# Patient Record
Sex: Female | Born: 1960 | Race: White | Hispanic: No | Marital: Married | Smoking: Former smoker
Health system: Southern US, Community
[De-identification: ages and names within clinical notes are randomized; demographics above are authoritative.]

## PROBLEM LIST (undated history)

## (undated) DIAGNOSIS — R768 Other specified abnormal immunological findings in serum: Secondary | ICD-10-CM

## (undated) DIAGNOSIS — R7689 Other specified abnormal immunological findings in serum: Secondary | ICD-10-CM

## (undated) DIAGNOSIS — M1990 Unspecified osteoarthritis, unspecified site: Secondary | ICD-10-CM

## (undated) DIAGNOSIS — R112 Nausea with vomiting, unspecified: Secondary | ICD-10-CM

## (undated) DIAGNOSIS — Z85828 Personal history of other malignant neoplasm of skin: Secondary | ICD-10-CM

## (undated) DIAGNOSIS — F419 Anxiety disorder, unspecified: Secondary | ICD-10-CM

## (undated) DIAGNOSIS — R002 Palpitations: Secondary | ICD-10-CM

## (undated) DIAGNOSIS — I10 Essential (primary) hypertension: Secondary | ICD-10-CM

## (undated) DIAGNOSIS — G510 Bell's palsy: Secondary | ICD-10-CM

## (undated) DIAGNOSIS — T8859XA Other complications of anesthesia, initial encounter: Secondary | ICD-10-CM

## (undated) DIAGNOSIS — F329 Major depressive disorder, single episode, unspecified: Secondary | ICD-10-CM

## (undated) DIAGNOSIS — C801 Malignant (primary) neoplasm, unspecified: Secondary | ICD-10-CM

## (undated) DIAGNOSIS — M797 Fibromyalgia: Secondary | ICD-10-CM

## (undated) DIAGNOSIS — Z9889 Other specified postprocedural states: Secondary | ICD-10-CM

## (undated) DIAGNOSIS — F32A Depression, unspecified: Secondary | ICD-10-CM

## (undated) DIAGNOSIS — G43909 Migraine, unspecified, not intractable, without status migrainosus: Secondary | ICD-10-CM

## (undated) DIAGNOSIS — K219 Gastro-esophageal reflux disease without esophagitis: Secondary | ICD-10-CM

## (undated) HISTORY — DX: Migraine, unspecified, not intractable, without status migrainosus: G43.909

## (undated) HISTORY — DX: Personal history of other malignant neoplasm of skin: Z85.828

## (undated) HISTORY — DX: Other specified abnormal immunological findings in serum: R76.89

## (undated) HISTORY — PX: TENDON REPAIR: SHX5111

## (undated) HISTORY — PX: KNEE ARTHROSCOPY W/ DEBRIDEMENT: SHX1867

## (undated) HISTORY — PX: WISDOM TOOTH EXTRACTION: SHX21

## (undated) HISTORY — PX: ABDOMINAL HYSTERECTOMY: SHX81

## (undated) HISTORY — DX: Essential (primary) hypertension: I10

## (undated) HISTORY — DX: Other specified abnormal immunological findings in serum: R76.8

## (undated) HISTORY — PX: ULNAR NERVE REPAIR: SHX2594

## (undated) HISTORY — DX: Gastro-esophageal reflux disease without esophagitis: K21.9

## (undated) HISTORY — PX: UPPER GI ENDOSCOPY: SHX6162

## (undated) HISTORY — DX: Depression, unspecified: F32.A

---
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
## 1998-03-25 ENCOUNTER — Ambulatory Visit: Admission: RE | Admit: 1998-03-25 | Discharge: 1998-03-25 | Payer: Self-pay | Admitting: *Deleted

## 1998-03-25 ENCOUNTER — Encounter: Payer: Self-pay | Admitting: *Deleted

## 1999-05-19 ENCOUNTER — Encounter (INDEPENDENT_AMBULATORY_CARE_PROVIDER_SITE_OTHER): Payer: Self-pay | Admitting: Specialist

## 1999-05-19 ENCOUNTER — Other Ambulatory Visit: Admission: RE | Admit: 1999-05-19 | Discharge: 1999-05-19 | Payer: Self-pay | Admitting: Otolaryngology

## 2000-02-15 HISTORY — PX: APPENDECTOMY: SHX54

## 2000-02-28 ENCOUNTER — Ambulatory Visit (HOSPITAL_COMMUNITY): Admission: RE | Admit: 2000-02-28 | Discharge: 2000-02-28 | Payer: Self-pay | Admitting: Gastroenterology

## 2000-08-18 ENCOUNTER — Encounter: Payer: Self-pay | Admitting: Internal Medicine

## 2000-08-18 ENCOUNTER — Ambulatory Visit (HOSPITAL_COMMUNITY): Admission: RE | Admit: 2000-08-18 | Discharge: 2000-08-18 | Payer: Self-pay | Admitting: Internal Medicine

## 2000-08-28 ENCOUNTER — Encounter: Payer: Self-pay | Admitting: Internal Medicine

## 2000-08-28 ENCOUNTER — Ambulatory Visit (HOSPITAL_COMMUNITY): Admission: RE | Admit: 2000-08-28 | Discharge: 2000-08-28 | Payer: Self-pay | Admitting: Internal Medicine

## 2001-04-26 ENCOUNTER — Emergency Department (HOSPITAL_COMMUNITY): Admission: EM | Admit: 2001-04-26 | Discharge: 2001-04-26 | Payer: Self-pay | Admitting: Internal Medicine

## 2001-04-26 ENCOUNTER — Encounter: Payer: Self-pay | Admitting: Internal Medicine

## 2002-02-14 HISTORY — PX: CHOLECYSTECTOMY: SHX55

## 2002-03-12 ENCOUNTER — Ambulatory Visit (HOSPITAL_COMMUNITY): Admission: RE | Admit: 2002-03-12 | Discharge: 2002-03-12 | Payer: Self-pay | Admitting: Family Medicine

## 2002-03-12 ENCOUNTER — Encounter: Payer: Self-pay | Admitting: Family Medicine

## 2002-04-09 ENCOUNTER — Encounter: Admission: RE | Admit: 2002-04-09 | Discharge: 2002-04-09 | Payer: Self-pay | Admitting: Thoracic Surgery

## 2002-04-09 ENCOUNTER — Encounter: Payer: Self-pay | Admitting: Thoracic Surgery

## 2003-08-13 ENCOUNTER — Observation Stay (HOSPITAL_COMMUNITY): Admission: EM | Admit: 2003-08-13 | Discharge: 2003-08-14 | Payer: Self-pay | Admitting: Emergency Medicine

## 2003-08-13 ENCOUNTER — Encounter (INDEPENDENT_AMBULATORY_CARE_PROVIDER_SITE_OTHER): Payer: Self-pay | Admitting: Specialist

## 2004-01-27 ENCOUNTER — Ambulatory Visit (HOSPITAL_COMMUNITY): Admission: RE | Admit: 2004-01-27 | Discharge: 2004-01-27 | Payer: Self-pay | Admitting: Internal Medicine

## 2004-07-02 ENCOUNTER — Encounter (HOSPITAL_COMMUNITY): Admission: RE | Admit: 2004-07-02 | Discharge: 2004-08-01 | Payer: Self-pay | Admitting: Internal Medicine

## 2004-07-02 ENCOUNTER — Ambulatory Visit (HOSPITAL_COMMUNITY): Payer: Self-pay | Admitting: Internal Medicine

## 2004-08-23 ENCOUNTER — Ambulatory Visit (HOSPITAL_COMMUNITY): Admission: RE | Admit: 2004-08-23 | Discharge: 2004-08-23 | Payer: Self-pay | Admitting: Internal Medicine

## 2007-02-27 ENCOUNTER — Ambulatory Visit (HOSPITAL_COMMUNITY): Admission: RE | Admit: 2007-02-27 | Discharge: 2007-02-27 | Payer: Self-pay | Admitting: Family Medicine

## 2007-09-26 ENCOUNTER — Ambulatory Visit (HOSPITAL_COMMUNITY): Admission: RE | Admit: 2007-09-26 | Discharge: 2007-09-26 | Payer: Self-pay | Admitting: Family Medicine

## 2009-02-10 ENCOUNTER — Ambulatory Visit (HOSPITAL_COMMUNITY): Admission: RE | Admit: 2009-02-10 | Discharge: 2009-02-10 | Payer: Self-pay | Admitting: Family Medicine

## 2009-02-17 ENCOUNTER — Encounter (HOSPITAL_COMMUNITY): Admission: RE | Admit: 2009-02-17 | Discharge: 2009-03-19 | Payer: Self-pay | Admitting: Family Medicine

## 2009-10-27 ENCOUNTER — Ambulatory Visit (HOSPITAL_COMMUNITY): Admission: RE | Admit: 2009-10-27 | Discharge: 2009-10-27 | Payer: Self-pay | Admitting: Internal Medicine

## 2009-11-16 ENCOUNTER — Encounter: Admission: RE | Admit: 2009-11-16 | Discharge: 2009-11-16 | Payer: Self-pay | Admitting: Neurosurgery

## 2009-12-28 ENCOUNTER — Encounter: Admission: RE | Admit: 2009-12-28 | Discharge: 2009-12-28 | Payer: Self-pay | Admitting: Neurosurgery

## 2010-01-19 ENCOUNTER — Encounter: Payer: Self-pay | Admitting: Urgent Care

## 2010-01-19 ENCOUNTER — Ambulatory Visit: Payer: Self-pay | Admitting: Internal Medicine

## 2010-01-19 ENCOUNTER — Ambulatory Visit (HOSPITAL_COMMUNITY)
Admission: RE | Admit: 2010-01-19 | Discharge: 2010-01-19 | Payer: Self-pay | Source: Home / Self Care | Admitting: Internal Medicine

## 2010-01-19 DIAGNOSIS — K5909 Other constipation: Secondary | ICD-10-CM | POA: Insufficient documentation

## 2010-01-19 DIAGNOSIS — R109 Unspecified abdominal pain: Secondary | ICD-10-CM | POA: Insufficient documentation

## 2010-01-19 DIAGNOSIS — K219 Gastro-esophageal reflux disease without esophagitis: Secondary | ICD-10-CM | POA: Insufficient documentation

## 2010-01-20 ENCOUNTER — Encounter: Payer: Self-pay | Admitting: Urgent Care

## 2010-01-21 ENCOUNTER — Encounter: Payer: Self-pay | Admitting: Urgent Care

## 2010-01-21 ENCOUNTER — Telehealth (INDEPENDENT_AMBULATORY_CARE_PROVIDER_SITE_OTHER): Payer: Self-pay

## 2010-01-21 LAB — CONVERTED CEMR LAB: TSH: 0.947 microintl units/mL (ref 0.350–4.500)

## 2010-03-07 ENCOUNTER — Encounter (INDEPENDENT_AMBULATORY_CARE_PROVIDER_SITE_OTHER): Payer: Self-pay | Admitting: Internal Medicine

## 2010-03-08 ENCOUNTER — Encounter: Payer: Self-pay | Admitting: Family Medicine

## 2010-03-09 ENCOUNTER — Encounter: Payer: Self-pay | Admitting: Urgent Care

## 2010-03-12 ENCOUNTER — Encounter: Payer: Self-pay | Admitting: Gastroenterology

## 2010-03-18 NOTE — Letter (Signed)
Summary: OP NOTE EGD FROM [facility]  OP NOTE EGD FROM [facility]   Imported By: Rexene Alberts 01/20/2010 09:09:52  _____________________________________________________________________  External Attachment:    Type:   Image     Comment:   External Document

## 2010-03-18 NOTE — Assessment & Plan Note (Signed)
Summary: ABD PAIN/SS   Visit Type:  Initial Consult Referring Provider:  McGough Primary Care Provider:  McGough  Chief Complaint:  abd pain.  History of Present Illness: 50 y/o caucasian female w/ 3 week hx of abdominal cramps & tenesmus.  Pain BLQ 8/10, radiates through entire abdomen, usually lasts 3-5 mins.  Then began to have mucous discharge 3-4 times that day, then the following day noticed dark formed stool and continued pain.  Having BMs every couple days at that point.  Took OTC laxative w/ good results that day.  Had fever.  Continued to feel urgency but no BM.  c/o nausea without vomiting.  Denies hx GI problems.  Wt gain 6 months 10#.  c/o fatigue. Wants to sleep all the time.  Denies dysphagia, odynophagia.  Increased omeprazole to 40mg  daily x 4 weeks due to contast epigastric pain.  Hx GERD x 6 yrs.  01/11/10 labs: WBC 11.2, ANC 8000 Hgb 13.5 plts 272   Current Problems (verified): 1)  Gerd  (ICD-530.81) 2)  Constipation, Chronic  (ICD-564.09) 3)  Abdominal Pain  (ICD-789.00)  Current Medications (verified): 1)  Citalopram Hydrobromide 20 Mg Tabs (Citalopram Hydrobromide) .... Take 1 Tablet By Mouth Once A Day 2)  Premarin 0.3 Mg Tabs (Estrogens Conjugated) .... Take 1 Tablet By Mouth Once A Day 3)  Topamax 100 Mg Tabs (Topiramate) .... Take 1 Tablet By Mouth Once A Day 4)  Omeprazole 40 Mg Cpdr (Omeprazole) .... Take 1 Tablet By Mouth Once A Day 5)  Bentyl 10 Mg Caps (Dicyclomine Hcl) .... Take 1 Tablet By Mouth Four Times A Day 6)  Multi Vitamin .... Take 1 Tablet By Mouth Once A Day 7)  Zinc 50mg  .... Take 1 Tablet By Mouth Once A Day 8)  Vitamin D3 5000 Iu .... Take 1 Tablet By Mouth Once A Day 9)  Vitamin B12  1000 Mcg .... Take 1 Tablet By Mouth Once A Day  Allergies (verified): 1)  ! * Latex 2)  ! Betadine 3)  ! Codeine 4)  ! Morphine 5)  ! Sulfa 6)  ! Celebrex 7)  ! Augmentin 8)  ! * Tequin 9)  ! * Tape 10)  ! 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  Alcohol Use - no Illicit Drug Use - no Patient does not get regular exercise.  Smoking Status:  never Drug Use:  no Does Patient Exercise:  no  Review of Systems General:  Denies sweats, anorexia, fatigue, weakness, malaise, weight loss, and sleep disorder. CV:  Denies chest pains, angina, palpitations, syncope, dyspnea on exertion, orthopnea, PND, peripheral edema, and claudication. Resp:  Denies dyspnea at rest, dyspnea with exercise, cough, sputum, wheezing, coughing up blood, and pleurisy. GI:  Denies vomiting blood, jaundice, and fecal incontinence. GU:  Denies urinary burning, blood in urine, nocturnal urination, urinary frequency, and urinary incontinence. MS:  Denies joint pain / LOM, joint swelling, joint stiffness, joint deformity, low back pain, muscle weakness, muscle cramps, muscle atrophy, leg pain at  night, leg pain with exertion, and shoulder pain / LOM hand / wrist pain (CTS). Derm:  Denies rash, itching, dry skin, hives, moles, warts, and unhealing ulcers. Psych:  Complains of anxiety; denies depression, memory loss, suicidal ideation, hallucinations, paranoia, phobia, and confusion. Heme:  Denies bruising, bleeding, and enlarged lymph nodes.  Vital Signs:  Patient profile:   50 year old female Height:      68 inches Weight:      154 pounds BMI:     23.50 Temp:     98.6 degrees F oral Pulse rate:   56 / minute BP sitting:    118 / 72  (left arm) Cuff size:   regular  Vitals Entered By: Cloria Spring LPN (January 19, 2010 11:37 AM)  Physical Exam  General:  Well developed, well nourished, no acute distress. Head:  Normocephalic and atraumatic. Eyes:  Sclera clear, no icterus. Ears:  Normal auditory acuity. Nose:  No deformity, discharge,  or lesions. Mouth:  No deformity or lesions, dentition normal. Neck:  Supple; no masses or thyromegaly. Lungs:  Clear throughout to auscultation. Heart:  Regular rate and rhythm; no murmurs, rubs,  or bruits. Abdomen:  normal bowel sounds, RLQ tenderness, LLQ tenderness, epigastric tenderness, without guarding, without rebound, no hernia, no distesion, no masses, and no hepatomegally or splenomegaly.   Msk:  Symmetrical with no gross deformities. Normal posture. Pulses:  Normal pulses noted. Extremities:  No clubbing, cyanosis, edema or deformities noted. Neurologic:  Alert and  oriented x4;  grossly normal neurologically. Skin:  Intact without significant lesions or rashes. Cervical Nodes:  No significant cervical adenopathy. Psych:  Alert and cooperative. Normal mood and affect.  Impression & Recommendations:  Problem # 1:  ABDOMINAL PAIN (ICD-789.00) 50 y/o caucasian female w/ 3 week hx of severe abdominal pain, tenesmus, fever, mild leukocytosis.  I suspect diverticulitis, colitis, or chronic constipation as culprit.  She has also had fatigue, unintentional weight gain & nausea, as well as chronic GERD.  ? Refractory GERD.  Previously evaluated by Dr Karilyn Cota in [place], [place] and had some tests done at [facility] (do not have records yet).    Orders: T-CBC w/Diff [phone]) T-TSH [phone]) T-Hepatic Function [phone]) T-Amylase [phone]) T-Lipase [id number]) T-Creatinine Blood ([id number])  Problem # 2:  GERD (ICD-530.81) see #1  Problem # 3:  CONSTIPATION, CHRONIC (ICD-564.09) See #1  Patient Instructions: 1)  To ER if severe pain 2)  Offered  pain meds, pt declined 3)  Request EGD report from Dr Karilyn Cota 4)  Records from [facility] evaluation 5)  Labs today 6)  CT Abd/pelvis w/ IV/oral contrast today 7)  Continue omeprazole 40mg  daily, may need change PPI pending CT findings, review old records cc: Dr Ralph Dowdy [facility])  Appended Document: Orders Update    Clinical Lists Changes  Orders: Added new Service order of Consultation Level III [id number]) - Signed

## 2010-03-18 NOTE — Letter (Signed)
Summary: AUTH FOR THE RELEASE OF MEDICAL INFORMATION  AUTH FOR THE RELEASE OF MEDICAL INFORMATION   Imported By: Rexene Alberts 03/09/2010 08:44:41  _____________________________________________________________________  External Attachment:    Type:   Image     Comment:   External Document

## 2010-03-18 NOTE — Medication Information (Signed)
Summary: protonix rx  protonix rx   Imported By: Hendricks Limes LPN 30/86/5784 69:62:95  _____________________________________________________________________  External Attachment:    Type:   Image     Comment:   External Document

## 2010-03-18 NOTE — Progress Notes (Signed)
Summary: change ppi rx  Phone Note Call from Patient Call back at Work Phone [phone]   Caller: Patient Summary of Call: pt called- Dexilant is not covered by her insurance and it was 150.00. pts insurance only covers omeprazole and pantoprazole on teir 1. pt is requesting pantoprazole because the omeprazole was not working well. Pt also uses mail order [facility]) it will need to be written rx so we can fax it. Initial call taken by: Hendricks Limes LPN,  January 21, 2010 9:26 AM     Appended Document: change ppi rx

## 2010-03-18 NOTE — Letter (Signed)
Summary: [facility]   Imported By: Rexene Alberts 03/09/2010 08:43:03  _____________________________________________________________________  External Attachment:    Type:   Image     Comment:   External Document

## 2010-03-18 NOTE — Letter (Signed)
Summary: REFERRAL FROM [facility] MEDICAL  REFERRAL FROM [facility] MEDICAL   Imported By: Rexene Alberts 01/19/2010 15:34:44  _____________________________________________________________________  External Attachment:    Type:   Image     Comment:   External Document

## 2010-03-18 NOTE — Letter (Signed)
Summary: CT SCAN ORDER  CT SCAN ORDER   Imported By: Ave Filter 01/19/2010 14:16:46  _____________________________________________________________________  External Attachment:    Type:   Image     Comment:   External Document

## 2010-07-02 NOTE — Op Note (Signed)
NAMEOVIE, EASTEP NO.:  [id number]   MEDICAL RECORD NO.:  [id number]                   PATIENT TYPE:  INP   LOCATION:  0101                                 FACILITY:  [facility]   PHYSICIAN:  Lorre Munroe., M.D.            DATE OF BIRTH:  01-17-1961   DATE OF PROCEDURE:  08/13/2003  DATE OF DISCHARGE:                                 OPERATIVE REPORT   PREOPERATIVE DIAGNOSIS:  Acute appendicitis.   POSTOPERATIVE DIAGNOSIS:  Acute appendicitis.   OPERATION:  Laparoscopic appendectomy.   SURGEON:  Lebron Conners, M.D.   ANESTHESIA:  General.   PROCEDURE:  After the patient was monitored and anesthetized with a routine  preparation and draping of her abdomen with a Foley catheter in place, I  infiltrated local anesthetic at three sites:  One in the left lower  quadrant, one just below the umbilicus, and one in the right upper quadrant.  I made a short transverse incision just below the umbilicus and incised the  fascia in the midline and bluntly entered the abdominal cavity.  I placed a  0 Vicryl purse-string suture in the fascia, secured a Hasson cannula, and  inflated the abdomen with CO2.  I then placed a 5 mm port under direct  vision through the right upper quadrant incision.  I then examined the  pelvic area and found typical acute appendicitis.  It did not appear to be  perforated.  I then placed a large port in the left lower quadrant and  grasped the appendix with a large ratcheted grasper and elevated it up out  of the pelvis and got good view of the appendix on the mesentery.  I thinned  the mesentery with the cautery, reducing the amount of fat so the stapler  would go across easily.  I stapled across the appendix near the cecum, where  it was healthy in appearance, using an endoscopic cutting stapler, dividing  the mesentery and the appendix with one firing of the stapler.  Hemostasis  was excellent.  The appendix divided all very  cleanly.  I placed it in a  plastic pouch and removed it through the umbilical incision and tied the  purse-string suture.  I examined the site of appendectomy and saw no  evidence of bleeding.  There was no significant amount of free fluid in the  abdomen.  I removed the right upper quadrant port under direct vision and  saw no bleeding from the puncture site.  I then allowed the CO2 to escape  through the left lower quadrant port and removed it.  I closed all of the  skin incisions with intracuticular Vicryl and Steri-Strips.  The patient was  stable throughout the operation.  Lorre Munroe., M.D.    Jodi Marble  D:  08/13/2003  T:  08/13/2003  Job:  [id number]

## 2010-07-02 NOTE — Procedures (Signed)
[place]  Patient:    Elizabeth Cohen, Elizabeth Cohen                        MRN: [id number] Proc. Date: 02/28/00 Attending:  Everardo All. Madilyn Fireman, M.D. CC:         Dr. Elfredia Nevins                           Procedure Report  PROCEDURE:  Esophagogastroduodenoscopy.  INDICATIONS FOR PROCEDURE:  Persistent chest pain with negative cardiac workup, unclear whether esophageal or not, with equivocal response to Prevacid.  DESCRIPTION OF PROCEDURE:  The Olympus video endoscope was advanced under direct vision into the oropharynx and esophagus.  The esophagus was straight and of normal caliber with the squamocolumnar line fairly sharply outlined at 38 cm.  It was slightly irregular with somewhat of a saw-toothed configuration; however, no discrete erosions were seen and no other visible evidence of inflammation.  There was no ring, stricture, or obviously visible hiatal hernia.  The stomach was entered, and a small amount of liquid secretions was suctioned from the fundus.  Retroflex view of the cardia was unremarkable.  The fundus, body, antrum, and pylorus all appeared normal.  The duodenum was entered, and both the bulb and second portion were well-inspected and appeared to be within normal limits.  The endoscope was drawn back into the stomach and CLOtest obtained.  The scope was then withdrawn and the patient returned to the recovery room in stable condition.  She tolerated the procedure well, and there were no immediate complications.  IMPRESSION:  Questionable mild evidence of esophagitis, manifest primarily by an irregular squamocolumnar line, otherwise normal endoscopy.  PLAN:  Await CLOtest and if negative, will treat with a course of Celebrex for presumed chest wall pain. DD:  02/28/00 TD:  02/28/00 Job: [id number]

## 2010-07-02 NOTE — H&P (Signed)
Elizabeth Cohen, Elizabeth Cohen NO.:  [id number]   MEDICAL RECORD NO.:  [id number]                   PATIENT TYPE:  EMS   LOCATION:  ED                                   FACILITY[facility]  [facility]   PHYSICIAN:  Lorre Munroe., M.D.            DATE OF BIRTH:  12-30-1960   DATE OF ADMISSION:  08/13/2003  DATE OF DISCHARGE:                                HISTORY & PHYSICAL   CHIEF COMPLAINT:  Abdominal pain.   HISTORY OF PRESENT ILLNESS:  A healthy 50 year old white female with a 12-  hour history of generalized and right-sided abdominal pain. S She has had  nausea but not vomiting.  No diarrhea.  She has felt warm but has not  recorded a fever.  She was seen at the emergency department, where lower  abdominal tenderness was noted, and a CT scan was ordered.  It is  interpreted by Dr. Si Gaul, as showing a large fluid-filled appendix, consistent  with early acute appendicitis.  After seeing the patient with the diagnosis  of acute appendicitis.   PAST MEDICAL HISTORY:  No chronic GI problems.  She denies all serious  chronic ailments.  She is on no chronic medicines.  She has had a vaginal  hysterectomy.   She has allergies to LATEX, BETADINE, and a number of antibiotics but says  she can take penicillin and cephalosporins.   She does not smoke or drink.   Family history and childhood illnesses are unremarkable.   A 15-point review of systems is unremarkable.   PHYSICAL EXAMINATION:  GENERAL:  A slender, healthy-appearing woman in no  acute distress.  VITAL SIGNS:  Unremarkable, as recorded by nursing staff.  HEENT/NECK:  Unremarkable with no mucosal abnormalities.  No  lymphadenopathy.  No thyromegaly.  CHEST:  Clear to auscultation.  HEART:  Rate and rhythm normal.  No murmur or gallop.  BREASTS:  Normal.  ABDOMEN:  Generally soft with slight guarding.  Some tenderness in all  quadrants but referred towards the right lower quadrant and maximal  tenderness in the  right lower quadrant with rebound tenderness present.  VAGINAL/RECTAL:  Not done.  EXTREMITIES:  No lesions noted.  Good pulses.  No edema.  SKIN:  No lesions are noted.  LYMPH NODES:  Not enlarged.  NEUROLOGIC:  Normal.   IMPRESSION:  Acute appendicitis.   PLAN:  Laparoscopic appendectomy.  Patient agrees to the operation.  I  mentioned possible complications of injury of the bowel, bleeding, or  anesthetic problems.  She wants to proceed.                                               Lorre Munroe., M.D.    Jodi Marble  D:  08/13/2003  T:  08/13/2003  Job:  [id number] 

## 2010-09-21 ENCOUNTER — Telehealth: Payer: Self-pay | Admitting: General Practice

## 2010-09-21 NOTE — Telephone Encounter (Signed)
Gastroenterology Pre-Procedure Form  Request Date: 09/21/2010,  Requesting Physician: DR Phillips Odor     PATIENT INFORMATION:  Elizabeth Cohen is a 50 y.o., female (DOB=[birth year]).  PROCEDURE: Procedure(s) requested: colonoscopy Procedure Reason: screening for colon cancer  PATIENT REVIEW QUESTIONS: The patient reports the following:   1. Diabetes Melitis: no 2. Joint replacements in the past 12 months: no 3. Major health problems in the past 3 months: no 4. Has an artificial valve or MVP:no 5. Has been advised in past to take antibiotics in advance of a procedure like teeth cleaning: no}    MEDICATIONS & ALLERGIES:    Patient reports the following regarding taking any blood thinners:   Plavix? no Aspirin?no Coumadin?  no  Patient confirms/reports the following medications:  Current Outpatient Prescriptions  Medication Sig Dispense Refill  . citalopram (CELEXA) 20 MG tablet Take 20 mg by mouth daily.        Marland Kitchen estrogens, conjugated, (PREMARIN) 0.3 MG tablet Take 0.3 mg by mouth daily. Take daily for 21 days then do not take for 7 days.       . fluticasone (VERAMYST) 27.5 MCG/SPRAY nasal spray Place 1 spray into the nose daily.        . Multiple Vitamin (MULTIVITAMIN) tablet Take 1 tablet by mouth daily.        Marland Kitchen omeprazole (PRILOSEC) 20 MG capsule Take 20 mg by mouth daily.        Marland Kitchen topiramate (TOPAMAX) 100 MG tablet Take 100 mg by mouth daily.          Patient confirms/reports the following allergies:  Allergies  Allergen Reactions  . Celecoxib   . Codeine   . EAV:WUJWJXBJYNW+GNFAOZHYQ+MVHQIONGEX Acid+Aspartame   . Latex   . Morphine   . Povidone-Iodine   . Sulfonamide Derivatives     Patient is appropriate to schedule for requested procedure(s): yes    SCHEDULE INFORMATION: Procedure has been scheduled as follows:  Date: 10/04/2010, Time: 8:15AM  Location: [facility]  This Gastroenterology Pre-Precedure Form is being routed to the following provider(s) for  review: R. Roetta Sessions, MD

## 2010-09-21 NOTE — Telephone Encounter (Signed)
Instructions and Rx mailed to the pt.

## 2010-09-21 NOTE — Telephone Encounter (Signed)
OK to proceed with colonoscopy.

## 2010-10-01 MED ORDER — SODIUM CHLORIDE 0.45 % IV SOLN
Freq: Once | INTRAVENOUS | Status: AC
  Administered 2010-10-04: 08:00:00 via INTRAVENOUS

## 2010-10-04 ENCOUNTER — Ambulatory Visit (HOSPITAL_COMMUNITY)
Admission: RE | Admit: 2010-10-04 | Discharge: 2010-10-04 | Disposition: A | Discharge status: Home / Self Care | Payer: 59 | Source: Ambulatory Visit | Attending: Internal Medicine | Admitting: Internal Medicine

## 2010-10-04 ENCOUNTER — Encounter (HOSPITAL_COMMUNITY)
Admission: RE | Disposition: A | Discharge status: Home / Self Care | Payer: Self-pay | Source: Ambulatory Visit | Attending: Internal Medicine

## 2010-10-04 ENCOUNTER — Other Ambulatory Visit: Payer: Self-pay | Admitting: Internal Medicine

## 2010-10-04 DIAGNOSIS — R197 Diarrhea, unspecified: Secondary | ICD-10-CM

## 2010-10-04 DIAGNOSIS — K5730 Diverticulosis of large intestine without perforation or abscess without bleeding: Secondary | ICD-10-CM

## 2010-10-04 DIAGNOSIS — K648 Other hemorrhoids: Secondary | ICD-10-CM | POA: Insufficient documentation

## 2010-10-04 HISTORY — PX: COLONOSCOPY: SHX5424

## 2010-10-04 SURGERY — COLONOSCOPY
Anesthesia: Moderate Sedation

## 2010-10-04 MED ORDER — MIDAZOLAM HCL 5 MG/5ML IJ SOLN
INTRAMUSCULAR | Status: DC | PRN
  Administered 2010-10-04 (×2): 2 mg via INTRAVENOUS
  Administered 2010-10-04: 1 mg via INTRAVENOUS

## 2010-10-04 MED ORDER — MEPERIDINE HCL 100 MG/ML IJ SOLN
INTRAMUSCULAR | Status: DC | PRN
  Administered 2010-10-04: 50 mg via INTRAVENOUS
  Administered 2010-10-04: 25 mg via INTRAVENOUS

## 2010-10-04 MED ORDER — STERILE WATER FOR IRRIGATION IR SOLN
Status: DC | PRN
  Administered 2010-10-04: 08:00:00

## 2010-10-04 MED ORDER — MEPERIDINE HCL 100 MG/ML IJ SOLN
INTRAMUSCULAR | Status: AC
  Filled 2010-10-04: qty 2

## 2010-10-04 MED ORDER — MIDAZOLAM HCL 5 MG/5ML IJ SOLN
INTRAMUSCULAR | Status: AC
  Filled 2010-10-04: qty 10

## 2010-10-04 NOTE — H&P (Signed)
Primary Care Physician:  Cassell Smiles., MD Primary Gastroenterologist:  Dr. Jena Gauss  Pre-Procedure History & Physical: HPI:  Elizabeth Cohen is a 50 y.o. female is here for a screening colonoscopy. Actually, patient reports to me in 8-9 month history of incessant nonbloody watery diarrhea. Gallbladder was removed last year. She wonders about a gluten sensitivity. She stopped taking bleeding thinks the diarrhea may have improved somewhat. No family history of inflammatory bowel disease or colon cancer. No prior colonoscopy  No past medical history on file.   GERD. Allergies.  No past surgical history on file.  cholecystectomy  Prior to Admission medications   Medication Sig Start Date End Date Taking? Authorizing Provider  citalopram (CELEXA) 20 MG tablet Take 20 mg by mouth daily.     Yes Historical Provider, MD  estrogens, conjugated, (PREMARIN) 0.3 MG tablet Take 0.3 mg by mouth daily. Take daily for 21 days then do not take for 7 days.    Yes Historical Provider, MD  fluticasone (VERAMYST) 27.5 MCG/SPRAY nasal spray Place 1 spray into the nose daily.     Yes Historical Provider, MD  Multiple Vitamin (MULTIVITAMIN) tablet Take 1 tablet by mouth daily.     Yes Historical Provider, MD  omeprazole (PRILOSEC) 20 MG capsule Take 20 mg by mouth daily.     Yes Historical Provider, MD  topiramate (TOPAMAX) 100 MG tablet Take 100 mg by mouth daily.     Yes Historical Provider, MD    Allergies as of 09/21/2010 - Review Complete 09/21/2010  Allergen Reaction Noted  . Celecoxib    . Codeine    . WUJ:WJXBJYNWGNF+AOZHYQMVH+QIONGEXBMW acid+aspartame    . Latex    . Morphine    . Povidone-iodine    . Sulfonamide derivatives      No family history on file.  mother had diverticulitis  History   Social History  . Marital Status: Divorced    Spouse Name: N/A    Number of Children: N/A  . Years of Education: N/A   Occupational History  . Not on file.   Social History Main Topics  .  Smoking status: Not on file  . Smokeless tobacco: Not on file  . Alcohol Use: Not on file  . Drug Use: Not on file  . Sexually Active: Not on file   Other Topics Concern  . Not on file   Social History Narrative  . No narrative on file    Review of Systems: See HPI, otherwise negative ROS  Physical Exam: BP 114/60  Pulse 49  Temp(Src) 97.6 F (36.4 C) (Oral)  Resp 22  Ht 5' 7.5" (1.715 m)  Wt 145 lb (65.772 kg)  BMI 22.38 kg/m2  SpO2 100% General:   Alert,  Well-developed, well-nourished, pleasant and cooperative in NAD Head:  Normocephalic and atraumatic. Eyes:  Sclera clear, no icterus.   Conjunctiva pink. Ears:  Normal auditory acuity. Nose:  No deformity, discharge,  or lesions. Mouth:  No deformity or lesions, dentition normal. Neck:  Supple; no masses or thyromegaly. Lungs:  Clear throughout to auscultation.   No wheezes, crackles, or rhonchi. No acute distress. Heart:  Regular rate and rhythm; no murmurs, clicks, rubs,  or gallops. Abdomen:  Soft, nontender and nondistended. No masses, hepatosplenomegaly or hernias noted. Normal bowel sounds, without guarding, and without rebound.   Msk:  Symmetrical without gross deformities. Normal posture. Pulses:  Normal pulses noted. Extremities:  Without clubbing or edema. Neurologic:  Alert and  oriented x4;  grossly normal  neurologically. Skin:  Intact without significant lesions or rashes. Cervical Nodes:  No significant cervical adenopathy. Psych:  Alert and cooperative. Normal mood and affect.  Impression/Plan: [name] is now here to undergo a olonoscopy.  In part for screening and part for diagnostic purposes. Need to rule out infectious process-less likely. We'll do biopsies to rule out microscopic colitis.  May have bile salt redistribution diarrhea relating to cholecystectomy. Celiac disease is also in the differential.   Risks, benefits, limitations, imponderables and alternatives regarding colonoscopy  have been reviewed with the patient. Questions have been answered. All parties agreeable.

## 2010-10-05 LAB — FECAL LACTOFERRIN, QUANT

## 2010-10-05 LAB — OVA AND PARASITE EXAMINATION

## 2010-10-06 NOTE — Progress Notes (Signed)
Results Cc to PCP  

## 2010-10-07 ENCOUNTER — Encounter (HOSPITAL_COMMUNITY): Payer: Self-pay | Admitting: Internal Medicine

## 2010-10-07 NOTE — Progress Notes (Signed)
Quick Note:  Tried to call pt- LMOM ______ 

## 2010-10-08 ENCOUNTER — Other Ambulatory Visit: Payer: Self-pay | Admitting: Internal Medicine

## 2010-10-08 DIAGNOSIS — R197 Diarrhea, unspecified: Secondary | ICD-10-CM

## 2010-10-08 LAB — STOOL CULTURE

## 2010-10-12 ENCOUNTER — Other Ambulatory Visit: Payer: Self-pay | Admitting: Internal Medicine

## 2010-10-13 LAB — IGA: IgA: 168 mg/dL (ref 69–380)

## 2010-10-25 ENCOUNTER — Encounter: Payer: Self-pay | Admitting: Internal Medicine

## 2010-10-28 ENCOUNTER — Inpatient Hospital Stay (INDEPENDENT_AMBULATORY_CARE_PROVIDER_SITE_OTHER)
Admission: RE | Admit: 2010-10-28 | Discharge: 2010-10-28 | Disposition: A | Discharge status: Home / Self Care | Payer: 59 | Source: Ambulatory Visit | Attending: Emergency Medicine | Admitting: Emergency Medicine

## 2010-10-28 DIAGNOSIS — M779 Enthesopathy, unspecified: Secondary | ICD-10-CM

## 2010-10-28 DIAGNOSIS — M2550 Pain in unspecified joint: Secondary | ICD-10-CM

## 2013-02-14 HISTORY — PX: CARPAL TUNNEL RELEASE: SHX101

## 2013-04-19 ENCOUNTER — Other Ambulatory Visit: Payer: Self-pay | Admitting: Dermatology

## 2014-12-25 ENCOUNTER — Encounter: Payer: Self-pay | Admitting: Nurse Practitioner

## 2014-12-25 ENCOUNTER — Ambulatory Visit (INDEPENDENT_AMBULATORY_CARE_PROVIDER_SITE_OTHER): Payer: Worker's Compensation

## 2014-12-25 ENCOUNTER — Ambulatory Visit (INDEPENDENT_AMBULATORY_CARE_PROVIDER_SITE_OTHER): Payer: Worker's Compensation | Admitting: Nurse Practitioner

## 2014-12-25 VITALS — BP 136/79 | HR 49 | Temp 97.2°F | Ht 67.0 in | Wt 156.0 lb

## 2014-12-25 DIAGNOSIS — M25572 Pain in left ankle and joints of left foot: Secondary | ICD-10-CM

## 2014-12-25 DIAGNOSIS — S9032XA Contusion of left foot, initial encounter: Secondary | ICD-10-CM | POA: Diagnosis not present

## 2014-12-25 NOTE — Progress Notes (Signed)
   Subjective:    Patient ID: Elizabeth Cohen, female    DOB: 1960-06-16, 54 y.o.   MRN: [id number]  HPI Date if Injury  12/24/14  Patient employed by [employer]. 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 Painful to walk on. Mild swelling.     Review of Systems  Constitutional: Negative.   HENT: Negative.   Respiratory: Negative.   Cardiovascular: Negative.   Neurological: Negative.   Psychiatric/Behavioral: Negative.   All other systems reviewed and are negative.      Objective:   Physical Exam  Constitutional: She appears well-developed and well-nourished.  Cardiovascular: Normal rate and normal heart sounds.   Pulmonary/Chest: Effort normal and breath sounds normal.  Musculoskeletal:  Mild dorsal medial edmea with echymosis- tender to touch along promixal end of 5th metatarsal . FROM of foot with pain on flexion.  Skin: Skin is warm.  Psychiatric: She has a normal mood and affect. Her behavior is normal. Judgment and thought content normal.    BP 136/79 mmHg  Pulse 49  Temp(Src) 97.2 F (36.2 C) (Oral)  Ht 5\' 7"  (1.702 m)  Wt 156 lb (70.761 kg)  BMI 24.43 kg/m2  Left ankle xray- no fracture-Preliminary reading by Ronnald Collum, FNP  [facility] Left foot xray- no fracture visible-Preliminary reading by Ronnald Collum, FNP  [facility]      Assessment & Plan:   1. Pain in joint, ankle and foot, left   2. Contusion, foot, left, initial encounter    Rest Ice BID Wrap in ace when up walking Elevate when sittting No work restrictions Motrin OTC for pain RTO prn  Mary-Margaret Hassell Done, FNP

## 2014-12-25 NOTE — Patient Instructions (Signed)

## 2015-08-13 ENCOUNTER — Ambulatory Visit: Payer: BLUE CROSS/BLUE SHIELD | Attending: Podiatry | Admitting: Physical Therapy

## 2015-08-13 DIAGNOSIS — M79671 Pain in right foot: Secondary | ICD-10-CM | POA: Diagnosis not present

## 2015-08-13 DIAGNOSIS — M25671 Stiffness of right ankle, not elsewhere classified: Secondary | ICD-10-CM

## 2015-08-13 NOTE — Therapy (Signed)
[facility] [place], [place], [address] Phone: [phone]   Fax:  [phone]  Physical Therapy Evaluation  Patient Details  Name: Elizabeth Cohen MRN: [id number] Date of Birth: 20-Oct-1960 Referring Provider: Steffanie Rainwater DPM  Encounter Date: 08/13/2015      PT End of Session - 08/13/15 1416    Visit Number 1   Number of Visits 12   Date for PT Re-Evaluation 09/24/15   PT Start Time 1120   PT Stop Time 1215   PT Time Calculation (min) 55 min   Activity Tolerance Patient tolerated treatment well   Behavior During Therapy [facility] for tasks assessed/performed      No past medical history on file.  Past Surgical History  Procedure Laterality Date  . Colonoscopy  10/04/2010    Procedure: COLONOSCOPY;  Surgeon: Daneil Dolin, MD;  [facility]: [facility];  Service: Endoscopy;  Laterality: N/A;  8:15AM    There were no vitals filed for this visit.       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   Limitations Standing   How long can you stand comfortably? 20 minutes.   Patient Stated Goals Walk without pain.   Currently in Pain? Yes   Pain Score 4    Pain Location Foot   Pain Orientation Right   Pain Descriptors / Indicators Aching   Pain Onset More than a month ago   Pain Frequency Constant   Aggravating Factors  Walking and standing.   Pain Relieving Factors Ice and stretching.            [facility] PT Assessment - 08/13/15 0001    Assessment   Medical Diagnosis Right plantar fasciitis and Achilles tendonitis.   Referring Provider Steffanie Rainwater DPM   Onset Date/Surgical Date --  6 weeks.   Precautions   Precautions None   Restrictions   Weight Bearing Restrictions No   Balance Screen   Has the patient fallen in the past 6 months Yes   Has the patient had a decrease in activity level because of a fear of falling?  No   Is the patient reluctant to leave their  home because of a fear of falling?  No   Home Environment   Living Environment Private residence   ROM / Strength   AROM / PROM / Strength AROM;Strength   AROM   Overall AROM Comments 2 degrees of right ankle dorsiflexion with knee extended and knee flexed.   Strength   Overall Strength Comments Normal right foot and ankle strength.   Palpation   Palpation comment Tender to palpation diffusely over the patient's right plantar fascia, distal Achilles and  entire right calf to popliteal fossa including deeper palpation into the Soleus.   Ambulation/Gait   Gait Comments Gait antlgia with decreased stance time over patient's right LE.                   [facility] PT Treatment/Exercise - 08/13/15 0001    Manual Therapy   Manual therapy comments In supine STW/M to patient's right plantar fascia and Achilles region x 28 minutes.                     PT Long Term Goals - 08/13/15 1509    PT LONG TERM GOAL #1   Title Independent with a HEP.   Time 6   Period Weeks  Status New   PT LONG TERM GOAL #2   Title Stand 30 minutes with a pain-level not to exceed 3/10.   Time 6   Period Weeks   Status New   PT LONG TERM GOAL #3   Title Walk a community distance with pain not > 3/10.   Time 6   Period Weeks   Status New   PT LONG TERM GOAL #4   Title Normal gait pattern.   Time 6   Period Weeks   Status New               Plan - 08/13/15 1417    Clinical Impression Statement The patient reports right foot and Achilles pain over the last 6 weeks.  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  Her pain has worsened over the last 6 weeks.  Her resting pain-level is a 4/10 today but can riset o near 10/0 after her workshift.   Rehab Potential Excellent   PT Frequency 2x / week   PT Duration 6 weeks   PT Treatment/Interventions ADLs/Self Care Home Management;Cryotherapy;Electrical Stimulation;Moist Heat;Therapeutic exercise;Therapeutic activities;Ultrasound;Manual  techniques;Passive range of motion   PT Next Visit Plan In prone:  Combo E'stim/U/S to right plantar fascia and Achilles f/b STW/M including IASTM to same areas and entire right calf region.  Progress to rockerboard.  Gatroc and Soleus stretch in clinic and for HEP.      Patient will benefit from skilled therapeutic intervention in order to improve the following deficits and impairments:  Pain, Decreased activity tolerance, Decreased range of motion  Visit Diagnosis: Pain in right foot - Plan: PT plan of care cert/re-cert  Stiffness of right ankle, not elsewhere classified - Plan: PT plan of care cert/re-cert     Problem List Patient Active Problem List   Diagnosis Date Noted  . GERD 01/19/2010  . CONSTIPATION, CHRONIC 01/19/2010  . ABDOMINAL PAIN 01/19/2010    Kyndra Condron, [place] MPT 08/13/2015, 3:15 PM  [facility] [address] [place], [place], [address] Phone: [phone]   Fax:  [phone]  Name: Elizabeth Cohen MRN: [id number] Date of Birth: 03-04-1960

## 2015-08-19 ENCOUNTER — Ambulatory Visit: Payer: BLUE CROSS/BLUE SHIELD | Attending: Podiatry | Admitting: Physical Therapy

## 2015-08-19 ENCOUNTER — Encounter: Payer: Self-pay | Admitting: Physical Therapy

## 2015-08-19 DIAGNOSIS — M79671 Pain in right foot: Secondary | ICD-10-CM | POA: Insufficient documentation

## 2015-08-19 DIAGNOSIS — M25671 Stiffness of right ankle, not elsewhere classified: Secondary | ICD-10-CM | POA: Insufficient documentation

## 2015-08-19 NOTE — Therapy (Signed)
[facility] [place], [place], [address] Phone: [phone]   Fax:  [phone]  Physical Therapy Treatment  Patient Details  Name: Elizabeth Cohen MRN: [id number] Date of Birth: [birth year] Referring Provider: Steffanie Rainwater DPM  Encounter Date: 08/19/2015      PT End of Session - 08/19/15 0904    Visit Number 2   Number of Visits 12   Date for PT Re-Evaluation 09/24/15   PT Start Time 0904   PT Stop Time 0945   PT Time Calculation (min) 41 min   Activity Tolerance Patient tolerated treatment well   Behavior During Therapy [facility] for tasks assessed/performed      No past medical history on file.  Past Surgical History  Procedure Laterality Date  . Colonoscopy  10/04/2010    Procedure: COLONOSCOPY;  Surgeon: Daneil Dolin, MD;  [facility];  Service: Endoscopy;  Laterality: N/A;  8:15AM    There were no vitals filed for this visit.      Subjective Assessment - 08/19/15 0903    Subjective Reports that her foot is no better and walks around on "terrible" floors for 10 hour shifts.   Limitations Standing   How long can you stand comfortably? 20 minutes.   Patient Stated Goals Walk without pain.   Currently in Pain? Yes   Pain Score 6    Pain Location Foot   Pain Orientation Right   Pain Descriptors / Indicators Tightness   Pain Onset More than a month ago   Pain Frequency Constant            [facility] PT Assessment - 08/19/15 0001    Assessment   Medical Diagnosis Right plantar fasciitis and Achilles tendonitis.   Precautions   Precautions None   Restrictions   Weight Bearing Restrictions No                     [facility] Adult PT Treatment/Exercise - 08/19/15 0001    Modalities   Modalities Ultrasound   Ultrasound   Ultrasound Location R Achilles Tendon/ PF   Ultrasound Parameters 1.2 w/cm2, 100%, 1 mhz x10 min   Ultrasound Goals Pain   Manual Therapy   Manual Therapy Myofascial  release   Myofascial Release IASTW to R Achilles Tendon, distal Gastrocnemius, PF to decrease tightness and adhesions                     PT Long Term Goals - 08/13/15 1509    PT LONG TERM GOAL #1   Title Independent with a HEP.   Time 6   Period Weeks   Status New   PT LONG TERM GOAL #2   Title Stand 30 minutes with a pain-level not to exceed 3/10.   Time 6   Period Weeks   Status New   PT LONG TERM GOAL #3   Title Walk a community distance with pain not > 3/10.   Time 6   Period Weeks   Status New   PT LONG TERM GOAL #4   Title Normal gait pattern.   Time 6   Period Weeks   Status New               Plan - 08/19/15 1019    Clinical Impression Statement Patient arrived to treatment with 6/10 R ankle/foot pain with reports of tightness in PF. Patient was educated regarding purpose of [place] and IASTW during treatment. Normal [place]  response noted following end of the [place] session. Redness noted with IASTW to R PF, Achilles Tendon with no reports of pain during the session. Goals remain on-going secondary to only patient's second visit to clinic for treatment. Patient reported feeling decreased R ankle tightness following end of treatment.   Rehab Potential Excellent   PT Frequency 2x / week   PT Duration 6 weeks   PT Treatment/Interventions ADLs/Self Care Home Management;Cryotherapy;Electrical Stimulation;Moist Heat;Therapeutic exercise;Therapeutic activities;Ultrasound;Manual techniques;Passive range of motion   PT Next Visit Plan In prone:  Combo E'stim/U/S to right plantar fascia and Achilles f/b STW/M including IASTM to same areas and entire right calf region.  Progress to rockerboard.  Gatroc and Soleus stretch in clinic and for HEP.   Consulted and Agree with Plan of Care Patient      Patient will benefit from skilled therapeutic intervention in order to improve the following deficits and impairments:  Pain, Decreased activity tolerance, Decreased range of  motion  Visit Diagnosis: Pain in right foot  Stiffness of right ankle, not elsewhere classified     Problem List Patient Active Problem List   Diagnosis Date Noted  . GERD 01/19/2010  . CONSTIPATION, CHRONIC 01/19/2010  . ABDOMINAL PAIN 01/19/2010    Elizabeth Cohen, PTA 08/19/2015, 10:24 AM  [facility] [address] [place], [place], [address] Phone: [phone]   Fax:  [phone]  Name: Elizabeth Cohen MRN: [id number] Date of Birth: 15-Jun-1960

## 2015-08-21 ENCOUNTER — Ambulatory Visit: Payer: BLUE CROSS/BLUE SHIELD | Admitting: Physical Therapy

## 2015-08-21 ENCOUNTER — Encounter: Payer: Self-pay | Admitting: Physical Therapy

## 2015-08-21 DIAGNOSIS — M79671 Pain in right foot: Secondary | ICD-10-CM | POA: Diagnosis not present

## 2015-08-21 DIAGNOSIS — M25671 Stiffness of right ankle, not elsewhere classified: Secondary | ICD-10-CM

## 2015-08-21 NOTE — Patient Instructions (Signed)
Gastroc Stretch    Stand with right foot back, leg straight, forward leg bent. Keeping heel on floor, turned slightly out, lean into wall until stretch is felt in calf. Hold _30___ seconds. Repeat __3__ times per set. Do ___1_ sets per session. Do __2-3__ sessions per day.  http://orth.exer.us/26   Copyright  VHI. All rights reserved.  Plantar Fascia Stretch    Standing with only ball of left foot on stair, push heel down until stretch is felt through arch of foot. Hold _30___ seconds. Relax. Repeat _3___ times per set. Do _1___ sets per session. Do __2-3__ sessions per day.  http://orth.exer.us/22   Copyright  VHI. All rights reserved.

## 2015-08-21 NOTE — Therapy (Signed)
[facility] [place], [place], [address] Phone: [phone]   Fax:  [phone]  Physical Therapy Treatment  Patient Details  Name: Elizabeth Cohen MRN: [id number] Date of Birth: 1960/10/26 Referring Provider: Steffanie Rainwater DPM  Encounter Date: 08/21/2015      PT End of Session - 08/21/15 0730    Visit Number 3   Number of Visits 12   Date for PT Re-Evaluation 09/24/15   PT Start Time 0732   PT Stop Time 0823   PT Time Calculation (min) 51 min   Activity Tolerance Patient tolerated treatment well   Behavior During Therapy [facility] for tasks assessed/performed      No past medical history on file.  Past Surgical History  Procedure Laterality Date  . Colonoscopy  10/04/2010    Procedure: COLONOSCOPY;  Surgeon: Daneil Dolin, MD;  Location[facility];  Service: Endoscopy;  Laterality: N/A;  8:15AM    There were no vitals filed for this visit.      Subjective Assessment - 08/21/15 0729    Subjective reports that she is very sore after next treatment. Feels a pull just proximal to first MTP joint.   Limitations Standing   How long can you stand comfortably? 20 minutes.   Patient Stated Goals Walk without pain.   Currently in Pain? Yes   Pain Score 5    Pain Location Foot   Pain Orientation Right   Pain Descriptors / Indicators Sore;Tender   Pain Onset More than a month ago            [facility] PT Assessment - 08/21/15 0001    Assessment   Medical Diagnosis Right plantar fasciitis and Achilles tendonitis.   Precautions   Precautions None   Restrictions   Weight Bearing Restrictions No                     [facility] Adult PT Treatment/Exercise - 08/21/15 0001    Modalities   Modalities Ultrasound   Ultrasound   Ultrasound Location R Achilles Tendon/ Plantar Fascia   Ultrasound Parameters 1.2 w/cm2, 100%,1 mhz x10 min   Ultrasound Goals Pain   Manual Therapy   Manual Therapy Myofascial release   Myofascial Release IASTW to R Achilles Tendon, distal Gastrocnemius, PF to decrease tightness and adhesions                PT Education - 08/21/15 [id number]    Education provided Yes   Education Details HEP- gastroc, PF stretch   Person(s) Educated Patient   Methods Explanation;Demonstration;Verbal cues;Handout   Comprehension Verbalized understanding;Returned demonstration;Verbal cues required             PT Long Term Goals - 08/21/15 0731    PT LONG TERM GOAL #1   Title Independent with a HEP.   Time 6   Period Weeks   Status On-going   PT LONG TERM GOAL #2   Title Stand 30 minutes with a pain-level not to exceed 3/10.   Time 6   Period Weeks   Status On-going   PT LONG TERM GOAL #3   Title Walk a community distance with pain not > 3/10.   Time 6   Period Weeks   Status On-going   PT LONG TERM GOAL #4   Title Normal gait pattern.   Time 6   Period Weeks   Status On-going  Plan - 08/21/15 [id number]    Clinical Impression Statement Patient arrived to treatment with R ankle/foot tenderness and soreness reports today. Patient was sensitive to palpation of R plantar fascia today as she reported the tenderness. Redness/petiche noted with IASTW to R achilles tendon today predominately. Normal [place] response noted following removal of the modalities. Accpeted new R foot/ankle stretching without questions and was encouraged to continue using frozen water bottle for pain control.   Rehab Potential Excellent   PT Frequency 2x / week   PT Duration 6 weeks   PT Treatment/Interventions ADLs/Self Care Home Management;Cryotherapy;Electrical Stimulation;Moist Heat;Therapeutic exercise;Therapeutic activities;Ultrasound;Manual techniques;Passive range of motion   PT Next Visit Plan In prone:  Combo E'stim/U/S to right plantar fascia and Achilles f/b STW/M including IASTM to same areas and entire right calf region.  Progress to rockerboard.  Gatroc and Soleus stretch in  clinic and for HEP.   PT Home Exercise Plan HEP- gastroc, PF stretch   Consulted and Agree with Plan of Care Patient      Patient will benefit from skilled therapeutic intervention in order to improve the following deficits and impairments:  Pain, Decreased activity tolerance, Decreased range of motion  Visit Diagnosis: Pain in right foot  Stiffness of right ankle, not elsewhere classified     Problem List Patient Active Problem List   Diagnosis Date Noted  . GERD 01/19/2010  . CONSTIPATION, CHRONIC 01/19/2010  . ABDOMINAL PAIN 01/19/2010    Wynelle Fanny, PTA 08/21/2015, 8:32 AM  [facility] [address] [place], [place], [address] Phone: [phone]   Fax:  [phone]  Name: Elizabeth Cohen MRN: [id number] Date of Birth: May 23, 1960

## 2015-08-25 ENCOUNTER — Encounter: Payer: Self-pay | Admitting: Physical Therapy

## 2015-08-25 ENCOUNTER — Ambulatory Visit: Payer: BLUE CROSS/BLUE SHIELD | Admitting: Physical Therapy

## 2015-08-25 DIAGNOSIS — M79671 Pain in right foot: Secondary | ICD-10-CM

## 2015-08-25 DIAGNOSIS — M25671 Stiffness of right ankle, not elsewhere classified: Secondary | ICD-10-CM

## 2015-08-25 NOTE — Therapy (Signed)
[facility] [place], [place], [address] Phone: [phone]   Fax:  [phone]  Physical Therapy Treatment  Patient Details  Name: NYELLI SKELLENGER MRN: [id number] Date of Birth: 05/14/1960 Referring Provider: Steffanie Rainwater DPM  Encounter Date: 08/25/2015      PT End of Session - 08/25/15 0808    Visit Number 4   Number of Visits 12   Date for PT Re-Evaluation 09/24/15   PT Start Time 0817   PT Stop Time 0900   PT Time Calculation (min) 43 min   Activity Tolerance Patient tolerated treatment well   Behavior During Therapy [facility] for tasks assessed/performed      No past medical history on file.  Past Surgical History  Procedure Laterality Date  . Colonoscopy  10/04/2010    Procedure: COLONOSCOPY;  Surgeon: Daneil Dolin, MD;  [facility];  Service: Endoscopy;  Laterality: N/A;  8:15AM    There were no vitals filed for this visit.      Subjective Assessment - 08/25/15 0808    Subjective Reports that her foot hurts and now into her arch. reports a tingling feeling if she touches the medial area at first metatarsal. With standing and walking 7/10.   Limitations Standing   How long can you stand comfortably? 20 minutes.   Patient Stated Goals Walk without pain.   Currently in Pain? Yes   Pain Score 5    Pain Location Foot   Pain Orientation Right   Pain Descriptors / Indicators Tingling;Discomfort            [facility] PT Assessment - 08/25/15 0001    Assessment   Medical Diagnosis Right plantar fasciitis and Achilles tendonitis.   Precautions   Precautions None   Restrictions   Weight Bearing Restrictions No                     [facility] Adult PT Treatment/Exercise - 08/25/15 0001    Modalities   Modalities Ultrasound   Ultrasound   Ultrasound Location R Achilles Tendon/ PF   Ultrasound Parameters 1.2 w/cm2, 100%, 1 mhz x10 min   Ultrasound Goals Pain   Manual Therapy   Manual Therapy  Myofascial release   Myofascial Release IASTW to R Achilles Tendon, distal Gastrocnemius, PF to decrease tightness and adhesions                     PT Long Term Goals - 08/21/15 0731    PT LONG TERM GOAL #1   Title Independent with a HEP.   Time 6   Period Weeks   Status On-going   PT LONG TERM GOAL #2   Title Stand 30 minutes with a pain-level not to exceed 3/10.   Time 6   Period Weeks   Status On-going   PT LONG TERM GOAL #3   Title Walk a community distance with pain not > 3/10.   Time 6   Period Weeks   Status On-going   PT LONG TERM GOAL #4   Title Normal gait pattern.   Time 6   Period Weeks   Status On-going               Plan - 08/25/15 0954    Clinical Impression Statement Patient arrived to clinic with continued R ankle/foot pain. Patient experiences R calf tightness as well per patient report. Patient experienced tenderness and soreness upon palpation and [place] head contact to  R plantar fascia. Patient also reported tenderness with IASTW to R plantar fascia region per patient report. Moderate petiche/redness noted with IASTW to R plantar fascia and Achilles Tendon. Normal [place] response noted following end of [place] session.    Rehab Potential Excellent   PT Frequency 2x / week   PT Duration 6 weeks   PT Treatment/Interventions ADLs/Self Care Home Management;Cryotherapy;Electrical Stimulation;Moist Heat;Therapeutic exercise;Therapeutic activities;Ultrasound;Manual techniques;Passive range of motion   PT Next Visit Plan Attempt rockerboard prior to [place] and manual therapy next treatment per MPT POC.   PT Home Exercise Plan HEP- gastroc, PF stretch   Consulted and Agree with Plan of Care Patient      Patient will benefit from skilled therapeutic intervention in order to improve the following deficits and impairments:  Pain, Decreased activity tolerance, Decreased range of motion  Visit Diagnosis: Pain in right foot  Stiffness of right ankle, not  elsewhere classified     Problem List Patient Active Problem List   Diagnosis Date Noted  . GERD 01/19/2010  . CONSTIPATION, CHRONIC 01/19/2010  . ABDOMINAL PAIN 01/19/2010    Wynelle Fanny, PTA 08/25/2015, 10:02 AM  [facility] [address] [place], [place], [address] Phone: [phone]   Fax:  [phone]  Name: ELIZANDRA KILLILEA MRN: [id number] Date of Birth: Jun 17, 1960

## 2015-08-27 ENCOUNTER — Ambulatory Visit: Payer: BLUE CROSS/BLUE SHIELD | Admitting: Physical Therapy

## 2015-08-27 ENCOUNTER — Encounter: Payer: Self-pay | Admitting: Physical Therapy

## 2015-08-27 DIAGNOSIS — M79671 Pain in right foot: Secondary | ICD-10-CM | POA: Diagnosis not present

## 2015-08-27 DIAGNOSIS — M25671 Stiffness of right ankle, not elsewhere classified: Secondary | ICD-10-CM

## 2015-08-27 NOTE — Therapy (Signed)
[facility] [place], [place], [address] Phone: [phone]   Fax:  [phone]  Physical Therapy Treatment  Patient Details  Name: Elizabeth Cohen MRN: [id number] Date of Birth: [birth year] Referring Provider: Steffanie Rainwater DPM  Encounter Date: 08/27/2015      PT End of Session - 08/27/15 0729    Visit Number 5   Number of Visits 12   Date for PT Re-Evaluation 09/24/15   PT Start Time 0730   PT Stop Time 0822   PT Time Calculation (min) 52 min   Activity Tolerance Patient tolerated treatment well   Behavior During Therapy [facility] for tasks assessed/performed      No past medical history on file.  Past Surgical History  Procedure Laterality Date  . Colonoscopy  10/04/2010    Procedure: COLONOSCOPY;  Surgeon: Daneil Dolin, MD;  [facility];  Service: Endoscopy;  Laterality: N/A;  8:15AM    There were no vitals filed for this visit.      Subjective Assessment - 08/27/15 0729    Subjective Reports that her foot still hurts over the top of her foot. Reports that she feels as the top of her R foot is where swelling occurs.   Limitations Standing   How long can you stand comfortably? 20 minutes.   Patient Stated Goals Walk without pain.   Currently in Pain? Yes   Pain Score 7    Pain Location Foot   Pain Orientation Right   Pain Descriptors / Indicators Discomfort   Pain Onset More than a month ago            [facility] PT Assessment - 08/27/15 0001    Assessment   Medical Diagnosis Right plantar fasciitis and Achilles tendonitis.   Precautions   Precautions None   Restrictions   Weight Bearing Restrictions No                     [facility] Adult PT Treatment/Exercise - 08/27/15 0001    Exercises   Exercises Ankle   Modalities   Modalities Electrical Stimulation;Ultrasound;Vasopneumatic   Acupuncturist Location R PF/ Achilles Tendon   Electrical  Stimulation Action PRe-Mod   Electrical Stimulation Parameters 80-150 hz x15 mn   Electrical Stimulation Goals Pain;Edema   Ultrasound   Ultrasound Location R Achilles Tendon/ PF   Ultrasound Parameters 1.2 w/cm2, 100%,1 mhz x10 min   Ultrasound Goals Pain   Vasopneumatic   Number Minutes Vasopneumatic  15 minutes   Vasopnuematic Location  Ankle   Vasopneumatic Pressure Medium   Vasopneumatic Temperature  36   Manual Therapy   Manual Therapy Myofascial release   Myofascial Release IASTW to R Achilles Tendon, distal Gastrocnemius, PF to decrease tightness and adhesions   Ankle Exercises: Standing   Rocker Board Other (comment)  x6 min                     PT Long Term Goals - 08/27/15 0816    PT LONG TERM GOAL #1   Title Independent with a HEP.   Time 6   Period Weeks   Status Achieved   PT LONG TERM GOAL #2   Title Stand 30 minutes with a pain-level not to exceed 3/10.   Time 6   Period Weeks   Status On-going   PT LONG TERM GOAL #3   Title Walk a community distance with pain not > 3/10.  Time 6   Period Weeks   Status On-going   PT LONG TERM GOAL #4   Title Normal gait pattern.   Time 6   Period Weeks   Status On-going               Plan - 08/27/15 0816    Clinical Impression Statement Patient arrived to clinic with continued reports of high level R foot/ankle pain especially over the top of her R foot. Rockerboard was initiated today to decrease tightness that patient had reported in calves in previous treatment. Normal [place] response noted following removal of the modalities. Moderate petiche noted in R Achilles Tendon and distal Gastrocnemius but none observed over R plantar fasciitis. Electrical stimulation and vasopenumatic system intiated to help control pain and to decrease inflammation that patient reports may be beginning. Experenced ankle/foot feeling "a little better" following end of treatment and "cold" from vasopneumatic machine.   Rehab  Potential Excellent   PT Frequency 2x / week   PT Duration 6 weeks   PT Treatment/Interventions ADLs/Self Care Home Management;Cryotherapy;Electrical Stimulation;Moist Heat;Therapeutic exercise;Therapeutic activities;Ultrasound;Manual techniques;Passive range of motion   PT Next Visit Plan Attempt rockerboard prior to [place] and manual therapy next treatment per MPT POC.   PT Home Exercise Plan HEP- gastroc, PF stretch   Consulted and Agree with Plan of Care Patient      Patient will benefit from skilled therapeutic intervention in order to improve the following deficits and impairments:  Pain, Decreased activity tolerance, Decreased range of motion  Visit Diagnosis: Pain in right foot  Stiffness of right ankle, not elsewhere classified     Problem List Patient Active Problem List   Diagnosis Date Noted  . GERD 01/19/2010  . CONSTIPATION, CHRONIC 01/19/2010  . ABDOMINAL PAIN 01/19/2010    Wynelle Fanny, PTA 08/27/2015, 8:29 AM  [facility] [address] [place], [place], [address] Phone: [phone]   Fax:  [phone]  Name: Elizabeth Cohen MRN: [id number] Date of Birth: [birth year]

## 2015-09-01 ENCOUNTER — Encounter: Payer: Self-pay | Admitting: Physical Therapy

## 2015-09-01 ENCOUNTER — Ambulatory Visit: Payer: BLUE CROSS/BLUE SHIELD | Admitting: Physical Therapy

## 2015-09-01 DIAGNOSIS — M25671 Stiffness of right ankle, not elsewhere classified: Secondary | ICD-10-CM

## 2015-09-01 DIAGNOSIS — M79671 Pain in right foot: Secondary | ICD-10-CM

## 2015-09-01 NOTE — Therapy (Signed)
[facility] [place], [place], [address] Phone: [phone]   Fax:  [phone]  Physical Therapy Treatment  Patient Details  Name: Elizabeth Cohen MRN: [id number] Date of Birth: July 25, 1960 Referring Provider: Steffanie Rainwater DPM  Encounter Date: 09/01/2015      PT End of Session - 09/01/15 0812    Visit Number 6   Number of Visits 12   Date for PT Re-Evaluation 09/24/15   PT Start Time 0814   PT Stop Time 0907   PT Time Calculation (min) 53 min   Activity Tolerance Patient tolerated treatment well   Behavior During Therapy [facility] for tasks assessed/performed      No past medical history on file.  Past Surgical History  Procedure Laterality Date  . Colonoscopy  10/04/2010    Procedure: COLONOSCOPY;  Surgeon: Daneil Dolin, MD;  [facility];  Service: Endoscopy;  Laterality: N/A;  8:15AM    There were no vitals filed for this visit.      Subjective Assessment - 09/01/15 0812    Subjective (p) Reports that she thinks she may be a little better today as the pain is not as bad as it has been.   Limitations Standing   How long can you stand comfortably? 20 minutes.   Patient Stated Goals Walk without pain.   Currently in Pain? (p) Yes   Pain Score (p) 5    Pain Location (p) Foot   Pain Orientation (p) Right   Pain Descriptors / Indicators (p) Discomfort   Pain Onset (p) More than a month ago            [facility] PT Assessment - 09/01/15 0001    Assessment   Medical Diagnosis Right plantar fasciitis and Achilles tendonitis.   Next MD Visit 09/08/2015   Precautions   Precautions None   Restrictions   Weight Bearing Restrictions No                     [facility] Adult PT Treatment/Exercise - 09/01/15 0001    Modalities   Modalities Electrical Stimulation;Ultrasound;Cryotherapy   Cryotherapy   Number Minutes Cryotherapy 15 Minutes   Cryotherapy Location Ankle   Type of Cryotherapy Ice pack   Electrical Stimulation   Electrical Stimulation Location R PF/ Achilles Tendon   Electrical Stimulation Action Pre-Mod   Electrical Stimulation Parameters 80-150 hz x15 min   Electrical Stimulation Goals Pain;Edema   Ultrasound   Ultrasound Location R Achilles Tendon/ PF   Ultrasound Parameters 1.2 w/cm2, 100%,1 mhz x10 min   Ultrasound Goals Pain   Manual Therapy   Manual Therapy Myofascial release   Myofascial Release IASTW to R Achilles Tendon, distal Gastrocnemius, PF to decrease tightness and adhesions   Ankle Exercises: Standing   Rocker Board Other (comment)  x7 min                     PT Long Term Goals - 08/27/15 0816    PT LONG TERM GOAL #1   Title Independent with a HEP.   Time 6   Period Weeks   Status Achieved   PT LONG TERM GOAL #2   Title Stand 30 minutes with a pain-level not to exceed 3/10.   Time 6   Period Weeks   Status On-going   PT LONG TERM GOAL #3   Title Walk a community distance with pain not > 3/10.   Time 6   Period  Weeks   Status On-going   PT LONG TERM GOAL #4   Title Normal gait pattern.   Time 6   Period Weeks   Status On-going               Plan - 09/01/15 [id number]    Clinical Impression Statement Patient arrived to treatment with decreased R foot/ankle pain than what she experienced at last treatment. Patient had no complaints with rockerboard activity today. Patient continues to be sensitive to palpation and IASTW at R PF and Achilles Tendon. Petiche continues to present at R Achilles Tendon and distal calf musculature during IASTW. Normal modalities response noted following removal of the modalities.   Rehab Potential Excellent   PT Frequency 2x / week   PT Duration 6 weeks   PT Treatment/Interventions ADLs/Self Care Home Management;Cryotherapy;Electrical Stimulation;Moist Heat;Therapeutic exercise;Therapeutic activities;Ultrasound;Manual techniques;Passive range of motion;Vasopneumatic Device   PT Next Visit Plan  Attempt rockerboard prior to [place] and manual therapy next treatment per MPT POC.   PT Home Exercise Plan HEP- gastroc, PF stretch   Consulted and Agree with Plan of Care Patient      Patient will benefit from skilled therapeutic intervention in order to improve the following deficits and impairments:  Pain, Decreased activity tolerance, Decreased range of motion  Visit Diagnosis: Pain in right foot  Stiffness of right ankle, not elsewhere classified     Problem List Patient Active Problem List   Diagnosis Date Noted  . GERD 01/19/2010  . CONSTIPATION, CHRONIC 01/19/2010  . ABDOMINAL PAIN 01/19/2010    Wynelle Fanny, PTA 09/01/2015, 9:16 AM  [facility] [address] [place], [place], [address] Phone: [phone]   Fax:  [phone]  Name: Elizabeth Cohen MRN: [id number] Date of Birth: 1960-07-04

## 2015-09-03 ENCOUNTER — Ambulatory Visit: Payer: BLUE CROSS/BLUE SHIELD | Admitting: Physical Therapy

## 2015-09-03 ENCOUNTER — Encounter: Payer: Self-pay | Admitting: Physical Therapy

## 2015-09-03 DIAGNOSIS — M79671 Pain in right foot: Secondary | ICD-10-CM

## 2015-09-03 DIAGNOSIS — M25671 Stiffness of right ankle, not elsewhere classified: Secondary | ICD-10-CM

## 2015-09-03 NOTE — Therapy (Signed)
[facility] [place], [place], [address] Phone: [phone]   Fax:  [phone]  Physical Therapy Treatment  Patient Details  Name: Elizabeth Cohen MRN: [id number] Date of Birth: 06-09-1960 Referring Provider: Steffanie Rainwater DPM  Encounter Date: 09/03/2015      PT End of Session - 09/03/15 0815    Visit Number 7   Number of Visits 12   Date for PT Re-Evaluation 09/24/15   PT Start Time 0816   PT Stop Time 0905   PT Time Calculation (min) 49 min   Activity Tolerance Patient tolerated treatment well   Behavior During Therapy [facility] for tasks assessed/performed      No past medical history on file.  Past Surgical History  Procedure Laterality Date  . Colonoscopy  10/04/2010    Procedure: COLONOSCOPY;  Surgeon: Daneil Dolin, MD;  [facility];  Service: Endoscopy;  Laterality: N/A;  8:15AM    There were no vitals filed for this visit.      Subjective Assessment - 09/03/15 0815    Subjective Reports that she hasn't been walking as much recently but feels as if her foot is a little better.   Limitations Standing   How long can you stand comfortably? 20 minutes.   Patient Stated Goals Walk without pain.   Currently in Pain? Yes   Pain Score 4    Pain Location Foot   Pain Orientation Right   Pain Descriptors / Indicators Tightness   Pain Onset More than a month ago            [facility] PT Assessment - 09/03/15 0001    Assessment   Medical Diagnosis Right plantar fasciitis and Achilles tendonitis.   Next MD Visit 09/08/2015   Precautions   Precautions None   Restrictions   Weight Bearing Restrictions No                     [facility] Adult PT Treatment/Exercise - 09/03/15 0001    Modalities   Modalities Electrical Stimulation;Ultrasound   Acupuncturist Location R PF/ Achilles Tendon   Electrical Stimulation Action Pre-Mod   Electrical Stimulation Parameters  80-150 hz x15 min   Electrical Stimulation Goals Pain;Edema   Ultrasound   Ultrasound Location R Achilles Tendon/ PF   Ultrasound Parameters 1.5 w/cm2, 100%, 3.3 mhz x10 min   Ultrasound Goals Pain   Manual Therapy   Manual Therapy Myofascial release   Myofascial Release IASTW to R Achilles Tendon, entire Gastrocnemius, PF to decrease tightness and adhesions   Ankle Exercises: Standing   Rocker Board 3 minutes   Ankle Exercises: Stretches   Soleus Stretch 5 reps;20 seconds   Gastroc Stretch 5 reps;20 seconds                     PT Long Term Goals - 08/27/15 0816    PT LONG TERM GOAL #1   Title Independent with a HEP.   Time 6   Period Weeks   Status Achieved   PT LONG TERM GOAL #2   Title Stand 30 minutes with a pain-level not to exceed 3/10.   Time 6   Period Weeks   Status On-going   PT LONG TERM GOAL #3   Title Walk a community distance with pain not > 3/10.   Time 6   Period Weeks   Status On-going   PT LONG TERM GOAL #4  Title Normal gait pattern.   Time 6   Period Weeks   Status On-going               Plan - 09/03/15 [id number]    Clinical Impression Statement Patient arrived to treatment with decreased R foot/ankle pain although tightness is still experienced. Patient experienced an intense stretch in upper gastroc during rockerboard activity. Patient completed both standing gastroc and soleus stretches but unable to match the stretch felt with rockerboard. Normal modalities response noted following removal of the modalities. IASTW completed to entire calf musculature, Achilles Tendon, PF with petiche/redness continued over R Achilles Tendon. Patient was unable to give update on standing or walking  timeframe as she has not tried to keep track of it per patient report.   Rehab Potential Excellent   PT Frequency 2x / week   PT Duration 6 weeks   PT Treatment/Interventions ADLs/Self Care Home Management;Cryotherapy;Electrical Stimulation;Moist  Heat;Therapeutic exercise;Therapeutic activities;Ultrasound;Manual techniques;Passive range of motion;Vasopneumatic Device   PT Next Visit Plan Complete MD note treatment as patient returns to MD 09/08/2015. Continue with POC per MPT POC.   PT Home Exercise Plan HEP- gastroc, PF stretch   Consulted and Agree with Plan of Care Patient      Patient will benefit from skilled therapeutic intervention in order to improve the following deficits and impairments:  Pain, Decreased activity tolerance, Decreased range of motion  Visit Diagnosis: Pain in right foot  Stiffness of right ankle, not elsewhere classified     Problem List Patient Active Problem List   Diagnosis Date Noted  . GERD 01/19/2010  . CONSTIPATION, CHRONIC 01/19/2010  . ABDOMINAL PAIN 01/19/2010    Wynelle Fanny, PTA 09/03/2015, 9:16 AM  [facility] [address] [place], [place], [address] Phone: [phone]   Fax:  [phone]  Name: KAMILE CICCARELLO MRN: [id number] Date of Birth: [birth year]

## 2015-09-07 ENCOUNTER — Ambulatory Visit: Payer: BLUE CROSS/BLUE SHIELD | Admitting: Physical Therapy

## 2015-09-07 DIAGNOSIS — M79671 Pain in right foot: Secondary | ICD-10-CM

## 2015-09-07 DIAGNOSIS — M25671 Stiffness of right ankle, not elsewhere classified: Secondary | ICD-10-CM

## 2015-09-07 NOTE — Therapy (Signed)
[facility] [place], [place], [address] Phone: [phone]   Fax:  [phone]  Physical Therapy Treatment  Patient Details  Name: Elizabeth Cohen MRN: [id number] Date of Birth: [birth year] Referring Provider: Steffanie Rainwater DPM  Encounter Date: 09/07/2015      PT End of Session - 09/07/15 0738    Visit Number 8   Number of Visits 12   Date for PT Re-Evaluation 09/24/15   PT Start Time 0731   PT Stop Time 0819   PT Time Calculation (min) 48 min   Activity Tolerance Patient tolerated treatment well;Patient limited by pain   Behavior During Therapy [facility] for tasks assessed/performed      No past medical history on file.  Past Surgical History:  Procedure Laterality Date  . COLONOSCOPY  10/04/2010   Procedure: COLONOSCOPY;  Surgeon: Daneil Dolin, MD;  [facility];  Service: Endoscopy;  Laterality: N/A;  8:15AM    There were no vitals filed for this visit.      Subjective Assessment - 09/07/15 0737    Subjective Reports that she tried to do more over the weekend and now her calf is really bothering her. Reports that she had extreme pain with getting groceries. Patient wonders if orthotic inserts may help her foot.   Limitations Standing   How long can you stand comfortably? 20 minutes.   Patient Stated Goals Walk without pain.   Currently in Pain? Yes   Pain Score 5    Pain Location Foot   Pain Orientation Right   Pain Descriptors / Indicators Tender;Sore;Shooting   Pain Onset More than a month ago   Aggravating Factors  Increased activity, standing, walking   Pain Relieving Factors Icing, stretching            [facility] PT Assessment - 09/07/15 0001      Assessment   Medical Diagnosis Right plantar fasciitis and Achilles tendonitis.   Next MD Visit 09/08/2015     Precautions   Precautions None     Restrictions   Weight Bearing Restrictions No                     [facility] Adult PT  Treatment/Exercise - 09/07/15 0001      Exercises   Exercises Ankle     Modalities   Modalities Electrical Stimulation;Ultrasound     Acupuncturist Location R PF/ Achilles Tendon   Electrical Stimulation Action Pre-Mod   Electrical Stimulation Parameters 80-150 hz x15 min   Electrical Stimulation Goals Pain;Edema     Ultrasound   Ultrasound Location R Achilles Tendon/ PF   Ultrasound Parameters 1.2 w/cm2, 100%, 1 mhz x10 min   Ultrasound Goals Pain     Manual Therapy   Manual Therapy Myofascial release   Myofascial Release IASTW to R Achilles Tendon, entire Gastrocnemius, PF to decrease tightness and adhesions     Ankle Exercises: Standing   Rocker Board Other (comment)  x6 min                     PT Long Term Goals - 09/07/15 0804      PT LONG TERM GOAL #1   Title Independent with a HEP.   Time 6   Period Weeks   Status Achieved     PT LONG TERM GOAL #2   Title Stand 30 minutes with a pain-level not to exceed 3/10.   Time 6  Period Weeks   Status On-going  15-20 minutes comfortably 09/07/2015     PT LONG TERM GOAL #3   Title Walk a community distance with pain not > 3/10.   Time 6   Period Weeks   Status On-going     PT LONG TERM GOAL #4   Title Normal gait pattern.   Time 6   Period Weeks   Status On-going               Plan - 09/07/15 0808    Clinical Impression Statement Patient continues to arrive to treatment with increased R calf and foot pain and tightness as she tries to increase activity level. Patient continues to experience stretch with standing rockerboard activity. Tightness continues to be noted in mid to lateral R gastroc with redness noted over R Achilles Tendon with IASTW. Patient continues to be sensitive to IASTW initially to R plantar fascia at this time. No goals other than HEP goal has been achieved at this time secondary to continued pain with standing or ambulation. Normal  modaliites response noted following removal of the modaliites.   Rehab Potential Excellent   PT Frequency 2x / week   PT Duration 6 weeks   PT Treatment/Interventions ADLs/Self Care Home Management;Cryotherapy;Electrical Stimulation;Moist Heat;Therapeutic exercise;Therapeutic activities;Ultrasound;Manual techniques;Passive range of motion;Vasopneumatic Device   PT Next Visit Plan Continue per MD discretion. Returns to MD 09/08/2015   PT Home Exercise Plan HEP- gastroc, PF stretch   Consulted and Agree with Plan of Care Patient      Patient will benefit from skilled therapeutic intervention in order to improve the following deficits and impairments:  Pain, Decreased activity tolerance, Decreased range of motion  Visit Diagnosis: Pain in right foot  Stiffness of right ankle, not elsewhere classified     Problem List Patient Active Problem List   Diagnosis Date Noted  . GERD 01/19/2010  . CONSTIPATION, CHRONIC 01/19/2010  . ABDOMINAL PAIN 01/19/2010    Ahmed Prima, PTA 09/07/15 1:29 PM Mali Applegate MPT [facility] [address] [place], [place], [address] Phone: [phone]   Fax:  [phone]  Name: Elizabeth Cohen MRN: [id number] Date of Birth: 1960/06/03

## 2015-09-09 ENCOUNTER — Encounter: Payer: Self-pay | Admitting: Physical Therapy

## 2015-09-09 ENCOUNTER — Ambulatory Visit: Payer: BLUE CROSS/BLUE SHIELD | Admitting: Physical Therapy

## 2015-09-09 DIAGNOSIS — M79671 Pain in right foot: Secondary | ICD-10-CM | POA: Diagnosis not present

## 2015-09-09 DIAGNOSIS — M25671 Stiffness of right ankle, not elsewhere classified: Secondary | ICD-10-CM

## 2015-09-09 NOTE — Therapy (Signed)
[facility] [place], [place], [address] Phone: [phone]   Fax:  [phone]  Physical Therapy Treatment  Patient Details  Name: Elizabeth Cohen MRN: [id number] Date of Birth: 05-24-1960 Referring Provider: Steffanie Rainwater DPM  Encounter Date: 09/09/2015      PT End of Session - 09/09/15 0807    Visit Number 9   Number of Visits 12   Date for PT Re-Evaluation 09/24/15   PT Start Time 0732   PT Stop Time 0823   PT Time Calculation (min) 51 min   Activity Tolerance Patient tolerated treatment well;Patient limited by pain   Behavior During Therapy [facility] for tasks assessed/performed      No past medical history on file.  Past Surgical History:  Procedure Laterality Date  . COLONOSCOPY  10/04/2010   Procedure: COLONOSCOPY;  Surgeon: Daneil Dolin, MD;  [facility];  Service: Endoscopy;  Laterality: N/A;  8:15AM    There were no vitals filed for this visit.      Subjective Assessment - 09/09/15 0732    Subjective (P)  Reports that MD wants her to continue with PT x1 month and he said an option would be surgery.    Limitations (P)  Standing   How long can you stand comfortably? (P)  20 minutes.   Patient Stated Goals (P)  Walk without pain.   Currently in Pain? (P)  Yes   Pain Score (P)  5    Pain Location (P)  Foot   Pain Orientation (P)  Right   Pain Descriptors / Indicators (P)  Sore;Tender            [facility] PT Assessment - 09/09/15 0001      Assessment   Medical Diagnosis Right plantar fasciitis and Achilles tendonitis.   Next MD Visit 10/08/2015     Precautions   Precautions None     Restrictions   Weight Bearing Restrictions No                     [facility] Adult PT Treatment/Exercise - 09/09/15 0001      Modalities   Modalities Electrical Stimulation;Ultrasound     Acupuncturist Location R PF/ Achilles Tendon   Electrical Stimulation Action  Pre-Mod   Electrical Stimulation Parameters 80-150 hz x15 min   Electrical Stimulation Goals Pain;Edema     Ultrasound   Ultrasound Location R Achilles Tendon/ distal Gastrocnemius/ PF   Ultrasound Parameters 1.2 w/cm2, 100%, 3.3 mhz x10 min   Ultrasound Goals Pain     Ankle Exercises: Standing   Rocker Board 5 minutes                     PT Long Term Goals - 09/07/15 0804      PT LONG TERM GOAL #1   Title Independent with a HEP.   Time 6   Period Weeks   Status Achieved     PT LONG TERM GOAL #2   Title Stand 30 minutes with a pain-level not to exceed 3/10.   Time 6   Period Weeks   Status On-going  15-20 minutes comfortably 09/07/2015     PT LONG TERM GOAL #3   Title Walk a community distance with pain not > 3/10.   Time 6   Period Weeks   Status On-going     PT LONG TERM GOAL #4   Title Normal gait pattern.  Time 6   Period Weeks   Status On-going               Plan - 09/09/15 0809    Clinical Impression Statement Patient continues to arrive at treatment with 5/10 R foot/ankle pain with reports to continue per MD. Continues to present with tightness of the R PF, Achilles Tendon, and gastrocnemius with reports of tenderness from patient. Continues redness/petiche noted with IASTW over R Achilles Tendon and distal gastrocnemius. Normal modalities response noted following removal of the modalities. Goals remain on-going at this time secondary to pain experienced by patient.   Rehab Potential Excellent   PT Frequency 2x / week   PT Duration 6 weeks   PT Treatment/Interventions ADLs/Self Care Home Management;Cryotherapy;Electrical Stimulation;Moist Heat;Therapeutic exercise;Therapeutic activities;Ultrasound;Manual techniques;Passive range of motion;Vasopneumatic Device   PT Next Visit Plan Continue with modalities and manual therapy for pain/tone control per MPT POC.   PT Home Exercise Plan HEP- gastroc, PF stretch   Consulted and Agree with Plan of  Care Patient      Patient will benefit from skilled therapeutic intervention in order to improve the following deficits and impairments:  Pain, Decreased activity tolerance, Decreased range of motion  Visit Diagnosis: Pain in right foot  Stiffness of right ankle, not elsewhere classified     Problem List Patient Active Problem List   Diagnosis Date Noted  . GERD 01/19/2010  . CONSTIPATION, CHRONIC 01/19/2010  . ABDOMINAL PAIN 01/19/2010    Wynelle Fanny, PTA 09/09/2015, 8:28 AM  [facility] [place], [place], [address] Phone: [phone]   Fax:  [phone]  Name: LYNNANN SARUBBI MRN: [id number] Date of Birth: 1960/05/03

## 2015-09-15 ENCOUNTER — Encounter: Payer: Self-pay | Admitting: Physical Therapy

## 2015-09-15 ENCOUNTER — Ambulatory Visit: Payer: BLUE CROSS/BLUE SHIELD | Attending: Podiatry | Admitting: Physical Therapy

## 2015-09-15 DIAGNOSIS — M79671 Pain in right foot: Secondary | ICD-10-CM | POA: Diagnosis present

## 2015-09-15 DIAGNOSIS — M25671 Stiffness of right ankle, not elsewhere classified: Secondary | ICD-10-CM

## 2015-09-15 NOTE — Therapy (Signed)
[facility] [place], [place], [address] Phone: [phone]   Fax:  [phone]  Physical Therapy Treatment  Patient Details  Name: Elizabeth Cohen MRN: [id number] Date of Birth: 08/28/1960 Referring Provider: Steffanie Rainwater DPM  Encounter Date: 09/15/2015      PT End of Session - 09/15/15 0735    Visit Number 10   Number of Visits 12   Date for PT Re-Evaluation 09/24/15   PT Start Time 0732   PT Stop Time 0825   PT Time Calculation (min) 53 min   Activity Tolerance Patient tolerated treatment well   Behavior During Therapy [facility] for tasks assessed/performed      No past medical history on file.  Past Surgical History:  Procedure Laterality Date  . COLONOSCOPY  10/04/2010   Procedure: COLONOSCOPY;  Surgeon: Daneil Dolin, MD;  [facility];  Service: Endoscopy;  Laterality: N/A;  8:15AM    There were no vitals filed for this visit.      Subjective Assessment - 09/15/15 0733    Subjective Reports that she may have done too much on Friday as she has returned from a trip to [place]. Reports that she feels like her calf is finally loosening up but still a little tight today. Reports that just her heel was very tender Sunday as they were returning home.   Limitations Standing   How long can you stand comfortably? 20-25 minutes   How long can you walk comfortably? Walked total of 2 miles yesterday although 1 mile comfortably   Patient Stated Goals Walk without pain.   Currently in Pain? Yes   Pain Score 3    Pain Location Foot   Pain Orientation Right   Pain Descriptors / Indicators Tender   Pain Onset More than a month ago            [facility] PT Assessment - 09/15/15 0001      Assessment   Medical Diagnosis Right plantar fasciitis and Achilles tendonitis.   Next MD Visit 10/08/2015     Precautions   Precautions None     Restrictions   Weight Bearing Restrictions No                      [facility] Adult PT Treatment/Exercise - 09/15/15 0001      Modalities   Modalities Cryotherapy;Electrical Stimulation;Ultrasound     Cryotherapy   Number Minutes Cryotherapy 15 Minutes   Cryotherapy Location Ankle   Type of Cryotherapy Ice pack     Acupuncturist Location R PF/ Achilles Tendon   Electrical Stimulation Action Pre-Mod   Electrical Stimulation Parameters 80-150 hz x15 min   Electrical Stimulation Goals Pain;Edema     Ultrasound   Ultrasound Location R Achilles Tendon/ PF   Ultrasound Parameters 1.2 w/cm2, 100%, 1 mhz x10 min   Ultrasound Goals Pain     Manual Therapy   Manual Therapy Myofascial release   Myofascial Release IASTW to R Achilles Tendon, mid to distal Gastrocnemius, PF to decrease tightness and adhesions     Ankle Exercises: Standing   Rocker Board 5 minutes                     PT Long Term Goals - 09/15/15 0820      PT LONG TERM GOAL #1   Title Independent with a HEP.   Time 6   Period Weeks   Status  Achieved     PT LONG TERM GOAL #2   Title Stand 30 minutes with a pain-level not to exceed 3/10.   Time 6   Period Weeks   Status On-going  15-20 minutes comfortably 09/07/2015     PT LONG TERM GOAL #3   Title Walk a community distance with pain not > 3/10.   Time 6   Period Weeks   Status On-going  1 mile pain 4/10, up to 5-6/10 as of 09/15/2015     PT LONG TERM GOAL #4   Title Normal gait pattern.   Time 6   Period Weeks   Status On-going               Plan - 09/15/15 0813    Clinical Impression Statement Patient continues to arrive with tenderness/tightness of R calf/ plantar fascia. Decreased overall tightness of the R gastrocnemius although redness/petiche noted with IASTW to Achilles Tendon/ distal Gastrocnemius. Tenderness continues to be experienced by patient with IASTW to R plantar fascia per patient report. Normal modalities response noted following removal of the  modalities. Patient now trying to progress standing and walking tolerance as to prepare for return to work. Able to now standing 20-25 minutes and able to walk a total of 2 miles although 1 mile walked comfortably.   Rehab Potential Excellent   PT Frequency 2x / week   PT Duration 6 weeks   PT Treatment/Interventions ADLs/Self Care Home Management;Cryotherapy;Electrical Stimulation;Moist Heat;Therapeutic exercise;Therapeutic activities;Ultrasound;Manual techniques;Passive range of motion;Vasopneumatic Device   PT Next Visit Plan Continue with modalities and manual therapy for pain/tone control per MPT POC.   PT Home Exercise Plan HEP- gastroc, PF stretch   Consulted and Agree with Plan of Care Patient      Patient will benefit from skilled therapeutic intervention in order to improve the following deficits and impairments:  Pain, Decreased activity tolerance, Decreased range of motion  Visit Diagnosis: Pain in right foot  Stiffness of right ankle, not elsewhere classified     Problem List Patient Active Problem List   Diagnosis Date Noted  . GERD 01/19/2010  . CONSTIPATION, CHRONIC 01/19/2010  . ABDOMINAL PAIN 01/19/2010    Wynelle Fanny, PTA 09/15/2015, 8:31 AM  [facility] [address] [place], [place], [address] Phone: [phone]   Fax:  [phone]  Name: Elizabeth Cohen MRN: [id number] Date of Birth: [birth year]

## 2015-09-17 ENCOUNTER — Ambulatory Visit: Payer: BLUE CROSS/BLUE SHIELD | Admitting: Physical Therapy

## 2015-09-17 ENCOUNTER — Encounter: Payer: Self-pay | Admitting: Physical Therapy

## 2015-09-17 DIAGNOSIS — M79671 Pain in right foot: Secondary | ICD-10-CM

## 2015-09-17 DIAGNOSIS — M25671 Stiffness of right ankle, not elsewhere classified: Secondary | ICD-10-CM

## 2015-09-17 NOTE — Therapy (Signed)
[facility] [place], [place], [address] Phone: [phone]   Fax:  [phone]  Physical Therapy Treatment  Patient Details  Name: Elizabeth Cohen MRN: [id number] Date of Birth: 09/02/1960 Referring Provider: Steffanie Rainwater DPM  Encounter Date: 09/17/2015      PT End of Session - 09/17/15 0739    Visit Number 11   Number of Visits 12   Date for PT Re-Evaluation 09/24/15   PT Start Time 0736   PT Stop Time 0825   PT Time Calculation (min) 49 min   Activity Tolerance Patient tolerated treatment well   Behavior During Therapy [facility] for tasks assessed/performed      No past medical history on file.  Past Surgical History:  Procedure Laterality Date  . COLONOSCOPY  10/04/2010   Procedure: COLONOSCOPY;  Surgeon: Daneil Dolin, MD;  [facility];  Service: Endoscopy;  Laterality: N/A;  8:15AM    There were no vitals filed for this visit.      Subjective Assessment - 09/17/15 0736    Subjective Reports that she had soreness after previous treatment and walked 2 miles after previous treatment. Reports that she can really feel her heel more today. Drove back and forth to [facility] yesterday and cannot tolerate driving long much.   Limitations Standing   How long can you stand comfortably? 20-25 minutes   How long can you walk comfortably? Walked total of 2 miles yesterday although 1 mile comfortably   Patient Stated Goals Walk without pain.   Currently in Pain? Yes   Pain Score 4    Pain Location Heel   Pain Orientation Right   Pain Descriptors / Indicators Sore   Pain Onset More than a month ago            [facility] PT Assessment - 09/17/15 0001      Assessment   Medical Diagnosis Right plantar fasciitis and Achilles tendonitis.   Next MD Visit 10/08/2015     Precautions   Precautions None     Restrictions   Weight Bearing Restrictions No                     [facility] Adult PT  Treatment/Exercise - 09/17/15 0001      Modalities   Modalities Cryotherapy;Electrical Stimulation;Ultrasound     Cryotherapy   Number Minutes Cryotherapy 15 Minutes   Cryotherapy Location Ankle   Type of Cryotherapy Ice pack     Acupuncturist Location R PF/ Achilles Tendon   Electrical Stimulation Action Pre-Mod   Electrical Stimulation Parameters 80-150 hz x15 min   Electrical Stimulation Goals Pain;Edema     Ultrasound   Ultrasound Location R Achilles Tendon/ PF/ Heel   Ultrasound Parameters 1.2 w/cm2, 3.3 mhz, 100% x10 min   Ultrasound Goals Pain     Manual Therapy   Manual Therapy Myofascial release   Myofascial Release IASTW to R Achilles Tendon, mid to distal Gastrocnemius, PF to decrease tightness and adhesions     Ankle Exercises: Standing   Rocker Board 5 minutes                     PT Long Term Goals - 09/15/15 0820      PT LONG TERM GOAL #1   Title Independent with a HEP.   Time 6   Period Weeks   Status Achieved     PT LONG TERM GOAL #2  Title Stand 30 minutes with a pain-level not to exceed 3/10.   Time 6   Period Weeks   Status On-going  15-20 minutes comfortably 09/07/2015     PT LONG TERM GOAL #3   Title Walk a community distance with pain not > 3/10.   Time 6   Period Weeks   Status On-going  1 mile pain 4/10, up to 5-6/10 as of 09/15/2015     PT LONG TERM GOAL #4   Title Normal gait pattern.   Time 6   Period Weeks   Status On-going               Plan - 09/17/15 [id number]    Clinical Impression Statement Patient arrived to clinic with mid level R heel soreness today. Patient continues to display redness/petiche with IASTW to R Achilles Tendon region. Patient was very tender medial to Achilles Tendon today per patient report. Normal modalites response noted following removal of the modalities.   Rehab Potential Excellent   PT Frequency 2x / week   PT Duration 6 weeks   PT  Treatment/Interventions ADLs/Self Care Home Management;Cryotherapy;Electrical Stimulation;Moist Heat;Therapeutic exercise;Therapeutic activities;Ultrasound;Manual techniques;Passive range of motion;Vasopneumatic Device   PT Next Visit Plan Continue with modalities and manual therapy for pain/tone control per MPT POC.   PT Home Exercise Plan HEP- gastroc, PF stretch   Consulted and Agree with Plan of Care Patient      Patient will benefit from skilled therapeutic intervention in order to improve the following deficits and impairments:  Pain, Decreased activity tolerance, Decreased range of motion  Visit Diagnosis: Pain in right foot  Stiffness of right ankle, not elsewhere classified     Problem List Patient Active Problem List   Diagnosis Date Noted  . GERD 01/19/2010  . CONSTIPATION, CHRONIC 01/19/2010  . ABDOMINAL PAIN 01/19/2010    Wynelle Fanny, PTA 09/17/2015, 9:04 AM  [facility] [address] [place], [place], [address] Phone: [phone]   Fax:  [phone]  Name: Elizabeth Cohen MRN: [id number] Date of Birth: Feb 08, 1961

## 2015-09-22 ENCOUNTER — Ambulatory Visit: Payer: BLUE CROSS/BLUE SHIELD | Admitting: Physical Therapy

## 2015-09-22 ENCOUNTER — Encounter: Payer: Self-pay | Admitting: Physical Therapy

## 2015-09-22 DIAGNOSIS — M25671 Stiffness of right ankle, not elsewhere classified: Secondary | ICD-10-CM

## 2015-09-22 DIAGNOSIS — M79671 Pain in right foot: Secondary | ICD-10-CM

## 2015-09-22 NOTE — Therapy (Signed)
[facility] [place], [place], [address] Phone: [phone]   Fax:  [phone]  Physical Therapy Treatment  Patient Details  Name: Elizabeth Cohen MRN: [id number] Date of Birth: [birth year] Referring Provider: Steffanie Rainwater DPM  Encounter Date: 09/22/2015      PT End of Session - 09/22/15 0735    Visit Number 12   Number of Visits 12   Date for PT Re-Evaluation 09/24/15   PT Start Time 0733   PT Stop Time 0820   PT Time Calculation (min) 47 min   Activity Tolerance Patient tolerated treatment well   Behavior During Therapy [facility] for tasks assessed/performed      No past medical history on file.  Past Surgical History:  Procedure Laterality Date  . COLONOSCOPY  10/04/2010   Procedure: COLONOSCOPY;  Surgeon: Daneil Dolin, MD;  [facility];  Service: Endoscopy;  Laterality: N/A;  8:15AM    There were no vitals filed for this visit.      Subjective Assessment - 09/22/15 0734    Subjective Reports that the heel pain is still present but not as bad today.   Limitations Standing   How long can you stand comfortably? 20-25 minutes   How long can you walk comfortably? Walked total of 2 miles yesterday although 1 mile comfortably   Currently in Pain? Yes   Pain Score 3    Pain Location Heel   Pain Orientation Right   Pain Descriptors / Indicators Sore   Pain Onset More than a month ago            [facility] PT Assessment - 09/22/15 0001      Assessment   Medical Diagnosis Right plantar fasciitis and Achilles tendonitis.   Next MD Visit 10/08/2015     Precautions   Precautions None     Restrictions   Weight Bearing Restrictions No                     [facility] Adult PT Treatment/Exercise - 09/22/15 0001      Modalities   Modalities Cryotherapy;Electrical Stimulation;Ultrasound     Cryotherapy   Number Minutes Cryotherapy 15 Minutes   Cryotherapy Location Ankle   Type of Cryotherapy Ice  pack     Acupuncturist Location R PF/ Achilles Tendon   Electrical Stimulation Action Pre-Mod   Electrical Stimulation Parameters 80-150 hz x15 min   Electrical Stimulation Goals Pain;Edema     Ultrasound   Ultrasound Location R Heel/ PF   Ultrasound Parameters 1.2 w/cm2, 100%, 3.3 mhz x10 min   Ultrasound Goals Pain     Ankle Exercises: Standing   Rocker Board 5 minutes                     PT Long Term Goals - 09/22/15 0809      PT LONG TERM GOAL #1   Title Independent with a HEP.   Time 6   Period Weeks   Status Achieved     PT LONG TERM GOAL #2   Title Stand 30 minutes with a pain-level not to exceed 3/10.   Time 6   Period Weeks   Status On-going  15-20 minutes comfortably 09/22/2015     PT LONG TERM GOAL #3   Title Walk a community distance with pain not > 3/10.   Time 6   Period Weeks   Status On-going  1 mile pain  4/10, up to 5-6/10 as of 09/22/2015     PT LONG TERM GOAL #4   Title Normal gait pattern.   Time 6   Period Weeks   Status On-going               Plan - 09/22/15 [id number]    Clinical Impression Statement Patient arrived to treatment with low level R heel soreness today. Tightness noted in posterior most attachment for R plantar fascia today. Patient did not report any soreness or tenderness with manual therapy today. Patient now able to tolerate longer periods of ambulation and shopping per patient reports from over the weekend. Normal modalities response noted following removal of the modalities.   Rehab Potential Excellent   PT Frequency 2x / week   PT Duration 6 weeks   PT Treatment/Interventions ADLs/Self Care Home Management;Cryotherapy;Electrical Stimulation;Moist Heat;Therapeutic exercise;Therapeutic activities;Ultrasound;Manual techniques;Passive range of motion;Vasopneumatic Device   PT Next Visit Plan Continue with modalities and manual therapy for pain/tone control per MPT POC.   PT Home  Exercise Plan HEP- gastroc, PF stretch   Consulted and Agree with Plan of Care Patient      Patient will benefit from skilled therapeutic intervention in order to improve the following deficits and impairments:  Pain, Decreased activity tolerance, Decreased range of motion  Visit Diagnosis: Pain in right foot  Stiffness of right ankle, not elsewhere classified     Problem List Patient Active Problem List   Diagnosis Date Noted  . GERD 01/19/2010  . CONSTIPATION, CHRONIC 01/19/2010  . ABDOMINAL PAIN 01/19/2010    Wynelle Fanny, PTA 09/22/2015, 8:56 AM  [facility] [address] [place], [place], [address] Phone: [phone]   Fax:  [phone]  Name: Elizabeth Cohen MRN: [id number] Date of Birth: [birth year]

## 2015-09-24 ENCOUNTER — Encounter: Payer: Self-pay | Admitting: Physical Therapy

## 2015-09-24 ENCOUNTER — Ambulatory Visit: Payer: BLUE CROSS/BLUE SHIELD | Admitting: Physical Therapy

## 2015-09-24 DIAGNOSIS — M79671 Pain in right foot: Secondary | ICD-10-CM

## 2015-09-24 DIAGNOSIS — M25671 Stiffness of right ankle, not elsewhere classified: Secondary | ICD-10-CM

## 2015-09-24 NOTE — Therapy (Signed)
[facility] [place], [place], [address] Phone: [phone]   Fax:  [phone]  Physical Therapy Treatment  Patient Details  Name: Elizabeth Cohen MRN: [id number] Date of Birth: Oct 16, 1960 Referring Provider: Steffanie Rainwater DPM  Encounter Date: 09/24/2015      PT End of Session - 09/24/15 0730    Visit Number 13   Number of Visits 24   Date for PT Re-Evaluation 10/27/15  per NO recieved by MD on 09/22/2015   PT Start Time 0729   PT Stop Time 0821   PT Time Calculation (min) 52 min   Activity Tolerance Patient tolerated treatment well   Behavior During Therapy [facility] for tasks assessed/performed      No past medical history on file.  Past Surgical History:  Procedure Laterality Date  . COLONOSCOPY  10/04/2010   Procedure: COLONOSCOPY;  Surgeon: Daneil Dolin, MD;  [facility]: [facility];  Service: Endoscopy;  Laterality: N/A;  8:15AM    There were no vitals filed for this visit.      Subjective Assessment - 09/24/15 0729    Subjective Reports that she went shopping all day yesterday. Reports that now she believes she is getting plantar fasciitis in L foot.   Limitations Standing   How long can you stand comfortably? 20-25 minutes   How long can you walk comfortably? Walked total of 2 miles yesterday although 1 mile comfortably   Patient Stated Goals Walk without pain.   Currently in Pain? Yes   Pain Score 5    Pain Location Heel   Pain Orientation Right   Pain Descriptors / Indicators Sharp   Pain Onset More than a month ago            [facility] PT Assessment - 09/24/15 0001      Assessment   Medical Diagnosis Right plantar fasciitis and Achilles tendonitis.   Next MD Visit 10/08/2015     Precautions   Precautions None     Restrictions   Weight Bearing Restrictions No                     [facility] Adult PT Treatment/Exercise - 09/24/15 0001      Modalities   Modalities Cryotherapy;Electrical  Stimulation;Ultrasound     Cryotherapy   Number Minutes Cryotherapy 15 Minutes   Cryotherapy Location Ankle   Type of Cryotherapy Ice pack     Electrical Stimulation   Electrical Stimulation Location R Plantar Fascia/ Heel   Electrical Stimulation Action Pre-Mod   Electrical Stimulation Parameters 80-150 hz x15 min   Electrical Stimulation Goals Pain;Edema     Ultrasound   Ultrasound Location R Heel/ Plantar Fascia   Ultrasound Parameters 1.2 w/cm2, 100%, 3.3 mhz x10 min   Ultrasound Goals Pain     Manual Therapy   Manual Therapy Myofascial release   Myofascial Release IASTW to R Heel/ Plantar Fascia to decrease pain and tightness     Ankle Exercises: Standing   Rocker Board Other (comment)  x6 min                     PT Long Term Goals - 09/22/15 0809      PT LONG TERM GOAL #1   Title Independent with a HEP.   Time 6   Period Weeks   Status Achieved     PT LONG TERM GOAL #2   Title Stand 30 minutes with a pain-level not to exceed  3/10.   Time 6   Period Weeks   Status On-going  15-20 minutes comfortably 09/22/2015     PT LONG TERM GOAL #3   Title Walk a community distance with pain not > 3/10.   Time 6   Period Weeks   Status On-going  1 mile pain 4/10, up to 5-6/10 as of 09/22/2015     PT LONG TERM GOAL #4   Title Normal gait pattern.   Time 6   Period Weeks   Status On-going               Plan - 09/24/15 0808    Clinical Impression Statement Patient arrived to treatment with increased R plantar fascia and heel pain as she was shopping for a long period of time yesterday.  Patient experienced tenderness to manual therapy to R heel today. Tightness noted predominately at the attachment of plantar fascia at heel. Goals remain on-going secondary to the increased pain with activities. Normal modalities response noted following removal of the modalities.   Rehab Potential Excellent   PT Frequency 2x / week   PT Duration 6 weeks   PT  Treatment/Interventions ADLs/Self Care Home Management;Cryotherapy;Electrical Stimulation;Moist Heat;Therapeutic exercise;Therapeutic activities;Ultrasound;Manual techniques;Passive range of motion;Vasopneumatic Device   PT Next Visit Plan Continue with modalities and manual therapy for pain/tone control per MPT POC.   PT Home Exercise Plan HEP- gastroc, PF stretch   Consulted and Agree with Plan of Care Patient      Patient will benefit from skilled therapeutic intervention in order to improve the following deficits and impairments:  Pain, Decreased activity tolerance, Decreased range of motion  Visit Diagnosis: Pain in right foot  Stiffness of right ankle, not elsewhere classified     Problem List Patient Active Problem List   Diagnosis Date Noted  . GERD 01/19/2010  . CONSTIPATION, CHRONIC 01/19/2010  . ABDOMINAL PAIN 01/19/2010    Wynelle Fanny, PTA 09/24/2015, 8:28 AM  [facility] [place], [place], [address] Phone: [phone]   Fax:  [phone]  Name: Elizabeth Cohen MRN: [id number] Date of Birth: [birth year]

## 2015-09-25 ENCOUNTER — Other Ambulatory Visit: Payer: Self-pay | Admitting: Neurosurgery

## 2015-09-25 DIAGNOSIS — M47812 Spondylosis without myelopathy or radiculopathy, cervical region: Secondary | ICD-10-CM

## 2015-09-28 ENCOUNTER — Ambulatory Visit: Payer: BLUE CROSS/BLUE SHIELD | Admitting: Physical Therapy

## 2015-09-28 DIAGNOSIS — M79671 Pain in right foot: Secondary | ICD-10-CM

## 2015-09-28 DIAGNOSIS — M25671 Stiffness of right ankle, not elsewhere classified: Secondary | ICD-10-CM

## 2015-09-28 NOTE — Therapy (Signed)
[facility] [place], [place], [address] Phone: [phone]   Fax:  [phone]  Physical Therapy Treatment  Patient Details  Name: Elizabeth Cohen MRN: [id number] Date of Birth: [birth year] Referring Provider: Steffanie Rainwater DPM  Encounter Date: 09/28/2015      PT End of Session - 09/28/15 1211    Visit Number 14   Number of Visits 24   Date for PT Re-Evaluation 10/27/15   PT Start Time 0900   PT Stop Time 0953   PT Time Calculation (min) 53 min      No past medical history on file.  Past Surgical History:  Procedure Laterality Date  . COLONOSCOPY  10/04/2010   Procedure: COLONOSCOPY;  Surgeon: Daneil Dolin, MD;  [facility];  Service: Endoscopy;  Laterality: N/A;  8:15AM    There were no vitals filed for this visit.      Subjective Assessment - 09/28/15 1214    Currently in Pain? Yes   Pain Score 4    Pain Location Heel   Pain Orientation Right   Pain Descriptors / Indicators [facility] Adult PT Treatment/Exercise - 09/28/15 0001      Electrical Stimulation   Electrical Stimulation Location RT plantar fascia and affected right Achille's tendon.   Electrical Stimulation Action Pre-mod   Electrical Stimulation Parameters 80-150 Hz x 15 minutes at 80-150 Hz.   Electrical Stimulation Goals Pain     Ultrasound   Ultrasound Location In prone.  U/S to left heel at 1.50 W/CM2 x 10 minutes     Manual Therapy   Myofascial Release STW/M including right Achille's tendon x 10 minutes.     Ankle Exercises: Standing   Rocker Board --  5 minutes.                     PT Long Term Goals - 09/22/15 0809      PT LONG TERM GOAL #1   Title Independent with a HEP.   Time 6   Period Weeks   Status Achieved     PT LONG TERM GOAL #2   Title Stand 30 minutes with a pain-level not to exceed 3/10.   Time 6   Period Weeks   Status On-going  15-20  minutes comfortably 09/22/2015     PT LONG TERM GOAL #3   Title Walk a community distance with pain not > 3/10.   Time 6   Period Weeks   Status On-going  1 mile pain 4/10, up to 5-6/10 as of 09/22/2015     PT LONG TERM GOAL #4   Title Normal gait pattern.   Time 6   Period Weeks   Status On-going             Patient will benefit from skilled therapeutic intervention in order to improve the following deficits and impairments:  Pain, Decreased activity tolerance, Decreased range of motion  Visit Diagnosis: Pain in right foot  Stiffness of right ankle, not elsewhere classified     Problem List Patient Active Problem List   Diagnosis Date Noted  . GERD 01/19/2010  . CONSTIPATION, CHRONIC 01/19/2010  . ABDOMINAL PAIN 01/19/2010    Jullien Granquist, [place]  MPT 09/28/2015, 12:18 PM  [facility] [address] [place], [place], [address] Phone: [phone]  Fax:  [phone]  Name: Elizabeth Cohen MRN: [id number] Date of Birth: [birth year]

## 2015-09-30 ENCOUNTER — Encounter: Payer: Self-pay | Admitting: Physical Therapy

## 2015-09-30 ENCOUNTER — Ambulatory Visit: Payer: BLUE CROSS/BLUE SHIELD | Admitting: Physical Therapy

## 2015-09-30 DIAGNOSIS — M25671 Stiffness of right ankle, not elsewhere classified: Secondary | ICD-10-CM

## 2015-09-30 DIAGNOSIS — M79671 Pain in right foot: Secondary | ICD-10-CM

## 2015-09-30 NOTE — Therapy (Signed)
[facility] [place], [place], [address] Phone: [phone]   Fax:  [phone]  Physical Therapy Treatment  Patient Details  Name: Elizabeth Cohen MRN: [id number] Date of Birth: 03-30-1960 Referring Provider: Steffanie Rainwater DPM  Encounter Date: 09/30/2015      PT End of Session - 09/30/15 0812    Visit Number 15   Number of Visits 24   Date for PT Re-Evaluation 10/27/15   PT Start Time 0729   PT Stop Time 0827   PT Time Calculation (min) 58 min   Activity Tolerance Patient tolerated treatment well   Behavior During Therapy [facility] for tasks assessed/performed      History reviewed. No pertinent past medical history.  Past Surgical History:  Procedure Laterality Date  . COLONOSCOPY  10/04/2010   Procedure: COLONOSCOPY;  Surgeon: Daneil Dolin, MD;  [facility];  Service: Endoscopy;  Laterality: N/A;  8:15AM    There were no vitals filed for this visit.      Subjective Assessment - 09/30/15 0733    Subjective Patient reported improvement overall and increased pain when standing   Limitations Standing   How long can you stand comfortably? 20-25 minutes   How long can you walk comfortably? Walked total of 2 miles yesterday although 1 mile comfortably   Patient Stated Goals Walk without pain.   Currently in Pain? Yes   Pain Score 3    Pain Location Heel   Pain Orientation Right   Pain Descriptors / Indicators Sharp   Pain Type Acute pain   Pain Onset More than a month ago   Pain Frequency Constant   Aggravating Factors  any prolong standing   Pain Relieving Factors strentching and at rest                         [facility] Adult PT Treatment/Exercise - 09/30/15 0001      Cryotherapy   Number Minutes Cryotherapy 15 Minutes   Cryotherapy Location Ankle   Type of Cryotherapy Ice pack     Electrical Stimulation   Electrical Stimulation Location RT plantar fascia and affected right Achille's  tendon.   Electrical Stimulation Action premod   Electrical Stimulation Parameters 80-150hz    Electrical Stimulation Goals Pain     Ultrasound   Ultrasound Location right heel   Ultrasound Parameters 1.2 w/cm2/50%/3.24mhzx10min   Ultrasound Goals Pain     Manual Therapy   Manual Therapy Myofascial release   Myofascial Release IASTM including right Achille's tendon and manual stretching to plantar fascia                     PT Long Term Goals - 09/22/15 0809      PT LONG TERM GOAL #1   Title Independent with a HEP.   Time 6   Period Weeks   Status Achieved     PT LONG TERM GOAL #2   Title Stand 30 minutes with a pain-level not to exceed 3/10.   Time 6   Period Weeks   Status On-going  15-20 minutes comfortably 09/22/2015     PT LONG TERM GOAL #3   Title Walk a community distance with pain not > 3/10.   Time 6   Period Weeks   Status On-going  1 mile pain 4/10, up to 5-6/10 as of 09/22/2015     PT LONG TERM GOAL #4   Title Normal gait pattern.  Time 6   Period Weeks   Status On-going               Plan - 09/30/15 0813    Clinical Impression Statement Patient tolerated treatment well today with continued palpable pain on plantar fascia and tenderness on achilles. Patient reported doing daily stretches and exercises as instructed. Patient rests when able yet job requires a lot of standing thus increasing pain. Patient reported 60%-65% improvement overall thus far. Patient progressing toward goals yet ongoing due to pain deficits esp with standing and walking.   Rehab Potential Excellent   PT Frequency 2x / week   PT Duration 6 weeks   PT Treatment/Interventions ADLs/Self Care Home Management;Cryotherapy;Electrical Stimulation;Moist Heat;Therapeutic exercise;Therapeutic activities;Ultrasound;Manual techniques;Passive range of motion;Vasopneumatic Device   PT Next Visit Plan Continue with modalities and manual therapy for pain/tone control per MPT POC.  (Dr. Irving Shows appt 10/08/15)   Consulted and Agree with Plan of Care Patient      Patient will benefit from skilled therapeutic intervention in order to improve the following deficits and impairments:  Pain, Decreased activity tolerance, Decreased range of motion  Visit Diagnosis: Pain in right foot  Stiffness of right ankle, not elsewhere classified     Problem List Patient Active Problem List   Diagnosis Date Noted  . GERD 01/19/2010  . CONSTIPATION, CHRONIC 01/19/2010  . ABDOMINAL PAIN 01/19/2010    Phillips Climes, PTA 09/30/2015, 8:29 AM  [facility] [address], [place], [address] Phone: [phone]   Fax:  [phone]  Name: ZIONNA GILLIGAN MRN: [id number] Date of Birth: 1960-07-22

## 2015-10-06 ENCOUNTER — Ambulatory Visit: Payer: BLUE CROSS/BLUE SHIELD | Admitting: Physical Therapy

## 2015-10-06 DIAGNOSIS — M79671 Pain in right foot: Secondary | ICD-10-CM

## 2015-10-06 DIAGNOSIS — M25671 Stiffness of right ankle, not elsewhere classified: Secondary | ICD-10-CM

## 2015-10-06 NOTE — Therapy (Signed)
[facility] [place], [place], [address] Phone: [phone]   Fax:  [phone]  Physical Therapy Treatment  Patient Details  Name: Elizabeth Cohen MRN: [id number] Date of Birth: 1960-11-02 Referring Provider: Steffanie Rainwater DPM  Encounter Date: 10/06/2015      PT End of Session - 10/06/15 0946    Visit Number 16   Number of Visits 24   Date for PT Re-Evaluation 10/27/15   PT Start Time 0946   PT Stop Time 1041   PT Time Calculation (min) 55 min   Activity Tolerance Patient tolerated treatment well   Behavior During Therapy [facility] for tasks assessed/performed      No past medical history on file.  Past Surgical History:  Procedure Laterality Date  . COLONOSCOPY  10/04/2010   Procedure: COLONOSCOPY;  Surgeon: Daneil Dolin, MD;  [facility];  Service: Endoscopy;  Laterality: N/A;  8:15AM    There were no vitals filed for this visit.      Subjective Assessment - 10/06/15 0947    Subjective Patient reports her soreness is about the same as it was last visit. She returns to MD 10/08/15.   Limitations Standing   How long can you stand comfortably? 20-25 minutes   Patient Stated Goals Walk without pain.   Currently in Pain? Yes   Pain Score 2    Pain Location Heel   Pain Orientation Right   Pain Descriptors / Indicators Sharp   Pain Type Acute pain                         [facility] Adult PT Treatment/Exercise - 10/06/15 0001      Cryotherapy   Number Minutes Cryotherapy 15 Minutes   Cryotherapy Location Ankle   Type of Cryotherapy Ice pack     Electrical Stimulation   Electrical Stimulation Location RT plantar fascia and affected right Achille's tendon.   Electrical Stimulation Action premod   Electrical Stimulation Parameters 80-150 Hz to tolerance x 15 min   Electrical Stimulation Goals Pain     Ultrasound   Ultrasound Location R medial heel and plantar fascia   Ultrasound Parameters  1.2 w/cm2 50% duty 3.3 mhz x 10 min   Ultrasound Goals Pain     Manual Therapy   Manual Therapy Myofascial release;Soft tissue mobilization   Soft tissue mobilization to R gastroc/soleus/ant and post tib   Myofascial Release to R PF and above muscles                     PT Long Term Goals - 09/22/15 0809      PT LONG TERM GOAL #1   Title Independent with a HEP.   Time 6   Period Weeks   Status Achieved     PT LONG TERM GOAL #2   Title Stand 30 minutes with a pain-level not to exceed 3/10.   Time 6   Period Weeks   Status On-going  15-20 minutes comfortably 09/22/2015     PT LONG TERM GOAL #3   Title Walk a community distance with pain not > 3/10.   Time 6   Period Weeks   Status On-going  1 mile pain 4/10, up to 5-6/10 as of 09/22/2015     PT LONG TERM GOAL #4   Title Normal gait pattern.   Time 6   Period Weeks   Status On-going  Plan - 10/06/15 1211    Clinical Impression Statement Patient continues to have marked tenderness of R medial Plantar fascia and also in R gastroc/soleus and ant/post tibialis muscles. She would likely benefit from dry needling to these areas.   PT Treatment/Interventions ADLs/Self Care Home Management;Cryotherapy;Electrical Stimulation;Moist Heat;Therapeutic exercise;Therapeutic activities;Ultrasound;Manual techniques;Passive range of motion;Vasopneumatic Device   PT Next Visit Plan MD note (PLEASE ADD DRY NEEDLING TO POC); Continue with modalities and manual therapy for pain/tone control per MPT POC. (Dr. Irving Shows appt 10/08/15)   Consulted and Agree with Plan of Care Patient      Patient will benefit from skilled therapeutic intervention in order to improve the following deficits and impairments:  Pain, Decreased activity tolerance, Decreased range of motion  Visit Diagnosis: Pain in right foot  Stiffness of right ankle, not elsewhere classified     Problem List Patient Active Problem List   Diagnosis  Date Noted  . GERD 01/19/2010  . CONSTIPATION, CHRONIC 01/19/2010  . ABDOMINAL PAIN 01/19/2010    Madelyn Flavors PT 10/06/2015, 12:18 PM  [facility] [address] [place], [place], [address] Phone: [phone]   Fax:  [phone]  Name: KALEA WHITIS MRN: [id number] Date of Birth: 04-29-1960

## 2015-10-08 ENCOUNTER — Encounter: Payer: Self-pay | Admitting: Physical Therapy

## 2015-10-08 ENCOUNTER — Ambulatory Visit: Payer: BLUE CROSS/BLUE SHIELD | Admitting: Physical Therapy

## 2015-10-08 DIAGNOSIS — M79671 Pain in right foot: Secondary | ICD-10-CM

## 2015-10-08 DIAGNOSIS — M25671 Stiffness of right ankle, not elsewhere classified: Secondary | ICD-10-CM

## 2015-10-08 NOTE — Therapy (Signed)
[facility] [place], [place], [address] Phone: [phone]   Fax:  [phone]  Physical Therapy Treatment  Patient Details  Name: Elizabeth Cohen MRN: [id number] Date of Birth: 02-02-1961 Referring Provider: Steffanie Rainwater DPM  Encounter Date: 10/08/2015      PT End of Session - 10/08/15 1039    Visit Number 17   Number of Visits 24   Date for PT Re-Evaluation 10/27/15   PT Start Time 0945   PT Stop Time [id number]   PT Time Calculation (min) 59 min   Activity Tolerance Patient tolerated treatment well   Behavior During Therapy [facility] for tasks assessed/performed      History reviewed. No pertinent past medical history.  Past Surgical History:  Procedure Laterality Date  . COLONOSCOPY  10/04/2010   Procedure: COLONOSCOPY;  Surgeon: Daneil Dolin, MD;  [facility];  Service: Endoscopy;  Laterality: N/A;  8:15AM    There were no vitals filed for this visit.      Subjective Assessment - 10/08/15 0955    Subjective Patient reported increased pain today   Limitations Standing   How long can you stand comfortably? 20-25 minutes   How long can you walk comfortably? Walked total of 2 miles yesterday although 1 mile comfortably   Currently in Pain? Yes   Pain Score 5    Pain Location Heel   Pain Orientation Right   Pain Descriptors / Indicators Sharp   Pain Type Acute pain   Pain Onset More than a month ago   Pain Frequency Constant   Aggravating Factors  prolong standing   Pain Relieving Factors at rest                         [facility] Adult PT Treatment/Exercise - 10/08/15 0001      Electrical Stimulation   Electrical Stimulation Location RT plantar fascia and affected right Achille's tendon.   Water quality scientist Parameters 80-150hz    Electrical Stimulation Goals Pain     Ultrasound   Ultrasound Location right med heel   Ultrasound Parameters  1.2w/cm2/50%/3.55mhz x21min   Ultrasound Goals Pain     Vasopneumatic   Number Minutes Vasopneumatic  15 minutes   Vasopnuematic Location  Ankle   Vasopneumatic Pressure Medium     Manual Therapy   Manual Therapy Myofascial release;Soft tissue mobilization   Myofascial Release manual stretching to plantar fascia     Ankle Exercises: Standing   Rocker Board --  10min                     PT Long Term Goals - 10/08/15 1023      PT LONG TERM GOAL #1   Title Independent with a HEP.   Time 6   Period Weeks   Status Achieved     PT LONG TERM GOAL #2   Title Stand 30 minutes with a pain-level not to exceed 3/10.   Time 6   Period Weeks   Status Achieved  3/10 10/08/15     PT LONG TERM GOAL #3   Title Walk a community distance with pain not > 3/10.   Time 6   Period Weeks   Status On-going  4-5/10 10/08/15     PT LONG TERM GOAL #4   Title Normal gait pattern.   Time 6   Period Weeks   Status On-going  antalgic gait  10/08/15               Plan - 10/08/15 1041    Clinical Impression Statement Patient is progressing with decreased pain with standing and walking yet has a lot of increased pain if standing more than 45 min or more. Patient has a lot of pain on plantar fascia and achilles today up to 5/10 and very palpable pain in both areas. Patient would like to return to work yet her job requires a lot of standing. MPT would like try dry needling if MD approve, also may consider ionto patch if so recommends. Patient able to meet a goal of standing for 30 min others ongoing due to pain with walking and prolong standing.    Rehab Potential Excellent   PT Frequency 2x / week   PT Duration 6 weeks   PT Treatment/Interventions ADLs/Self Care Home Management;Cryotherapy;Electrical Stimulation;Moist Heat;Therapeutic exercise;Therapeutic activities;Ultrasound;Manual techniques;Passive range of motion;Vasopneumatic Device   PT Next Visit Plan MD note (PLEASE ADD DRY  NEEDLING TO POC); Continue with modalities and manual therapy for pain/tone control per MPT POC. (Dr. Irving Shows appt 10/08/15)   Consulted and Agree with Plan of Care Patient      Patient will benefit from skilled therapeutic intervention in order to improve the following deficits and impairments:  Pain, Decreased activity tolerance, Decreased range of motion  Visit Diagnosis: Pain in right foot  Stiffness of right ankle, not elsewhere classified     Problem List Patient Active Problem List   Diagnosis Date Noted  . GERD 01/19/2010  . CONSTIPATION, CHRONIC 01/19/2010  . ABDOMINAL PAIN 01/19/2010    APPLE[place], [place], PTA 10/08/2015, 11:26 AM  Mali Applegate MPT [facility] [address] [place], [place], [address] Phone: [phone]   Fax:  [phone]  Name: Elizabeth Cohen MRN: [id number] Date of Birth: 10-17-1960

## 2015-10-09 ENCOUNTER — Ambulatory Visit
Admission: RE | Admit: 2015-10-09 | Discharge: 2015-10-09 | Disposition: A | Discharge status: Home / Self Care | Payer: BLUE CROSS/BLUE SHIELD | Source: Ambulatory Visit | Attending: Neurosurgery | Admitting: Neurosurgery

## 2015-10-09 DIAGNOSIS — M47812 Spondylosis without myelopathy or radiculopathy, cervical region: Secondary | ICD-10-CM

## 2015-10-09 MED ORDER — TRIAMCINOLONE ACETONIDE 40 MG/ML IJ SUSP (RADIOLOGY)
60.0000 mg | Freq: Once | INTRAMUSCULAR | Status: AC
  Administered 2015-10-09: 60 mg via EPIDURAL

## 2015-10-09 MED ORDER — IOPAMIDOL (ISOVUE-M 300) INJECTION 61%
1.0000 mL | Freq: Once | INTRAMUSCULAR | Status: AC | PRN
  Administered 2015-10-09: 1 mL via EPIDURAL

## 2015-10-09 NOTE — Discharge Instructions (Signed)

## 2015-10-12 ENCOUNTER — Ambulatory Visit: Payer: BLUE CROSS/BLUE SHIELD | Admitting: Physical Therapy

## 2015-10-12 DIAGNOSIS — M79671 Pain in right foot: Secondary | ICD-10-CM | POA: Diagnosis not present

## 2015-10-12 DIAGNOSIS — M25671 Stiffness of right ankle, not elsewhere classified: Secondary | ICD-10-CM

## 2015-10-12 NOTE — Patient Instructions (Signed)
Trigger Point Dry Needling  . What is Trigger Point Dry Needling (DN)? o DN is a physical therapy technique used to treat muscle pain and dysfunction. Specifically, DN helps deactivate muscle trigger points (muscle knots).  o A thin filiform needle is used to penetrate the skin and stimulate the underlying trigger point. The goal is for a local twitch response (LTR) to occur and for the trigger point to relax. No medication of any kind is injected during the procedure.   . What Does Trigger Point Dry Needling Feel Like?  o The procedure feels different for each individual patient. Some patients report that they do not actually feel the needle enter the skin and overall the process is not painful. Very mild bleeding may occur. However, many patients feel a deep cramping in the muscle in which the needle was inserted. This is the local twitch response.   Marland Kitchen How Will I feel after the treatment? o Soreness is normal, and the onset of soreness may not occur for a few hours. Typically this soreness does not last longer than two days.  o Bruising is uncommon, however; ice can be used to decrease any possible bruising.  o In rare cases feeling tired or nauseous after the treatment is normal. In addition, your symptoms may get worse before they get better, this period will typically not last longer than 24 hours.   . What Can I do After My Treatment? o Increase your hydration by drinking more water for the next 24 hours. o You may place ice or heat on the areas treated that have become sore, however, do not use heat on inflamed or bruised areas. Heat often brings more relief post needling. o You can continue your regular activities, but vigorous activity is not recommended initially after the treatment for 24 hours. o DN is best combined with other physical therapy such as strengthening, stretching, and other therapies.   Madelyn Flavors, PT   [facility] [place], [place], [address] Phone: [phone]   Fax:  [phone]

## 2015-10-12 NOTE — Therapy (Signed)
[facility] [place], [place], [address] Phone: [phone]   Fax:  [phone]  Physical Therapy Treatment  Patient Details  Name: Elizabeth Cohen MRN: [id number] Date of Birth: 1960-07-13 Referring Provider: Steffanie Rainwater DPM  Encounter Date: 10/12/2015      PT End of Session - 10/12/15 0905    Visit Number 18   Number of Visits 26   Date for PT Re-Evaluation 11/09/15   PT Start Time 0905   PT Stop Time 1001   PT Time Calculation (min) 56 min   Activity Tolerance Patient tolerated treatment well   Behavior During Therapy [facility] for tasks assessed/performed      No past medical history on file.  Past Surgical History:  Procedure Laterality Date  . COLONOSCOPY  10/04/2010   Procedure: COLONOSCOPY;  Surgeon: Daneil Dolin, MD;  [facility];  Service: Endoscopy;  Laterality: N/A;  8:15AM    There were no vitals filed for this visit.      Subjective Assessment - 10/12/15 [id number]    Subjective Patient saw Dr. Irving Shows and he wanted her to try DN.   Limitations Standing   How long can you stand comfortably? 20-25 minutes   How long can you walk comfortably? Walked total of 2 miles yesterday although 1 mile comfortably   Currently in Pain? Yes   Pain Score 2    Pain Location Heel   Pain Orientation Right   Pain Descriptors / Indicators Sharp   Pain Type Acute pain   Pain Onset More than a month ago   Aggravating Factors  prolonged standing   Pain Relieving Factors rest   Effect of Pain on Daily Activities cannot work            [facility] PT Assessment - 10/12/15 0001      AROM   Overall AROM Comments R ankle DF knee straight -4 deg (4 deg passive); with knee bent 4 deg (8 deg passive)                     [facility] Adult PT Treatment/Exercise - 10/12/15 0001      Modalities   Modalities Electrical Stimulation;Moist Heat     Moist Heat Therapy   Number Minutes Moist Heat 15 Minutes   Moist  Heat Location Other (comment)  R gastroc/soleus     Electrical Stimulation   Electrical Stimulation Location R quadratus plantae and gastroc   Electrical Stimulation Action premod   Electrical Stimulation Parameters 80-150 Hz to tolerance x 15 min   Electrical Stimulation Goals Pain     Manual Therapy   Manual Therapy Myofascial release;Soft tissue mobilization   Soft tissue mobilization to R gastroc/soleus   Myofascial Release to R plantar fascia     Ankle Exercises: Stretches   Soleus Stretch 1 rep;30 seconds   Gastroc Stretch 60 seconds;2 reps          Trigger Point Dry Needling - 10/12/15 0953    Consent Given? Yes   Education Handout Provided Yes   Muscles Treated Lower Body Quadratus plantae;Gastrocnemius;Soleus   Gastrocnemius Response Twitch response elicited;Palpable increased muscle length   Soleus Response Twitch response elicited;Palpable increased muscle length   Quadatus Plantae Response Twitch response elicited;Palpable increased muscle length              PT Education - 10/12/15 0954    Education provided Yes   Education Details DN education and aftercare   Person(s)  Educated Patient   Methods Explanation;Demonstration;Handout   Comprehension Verbalized understanding             PT Long Term Goals - 10/08/15 1023      PT LONG TERM GOAL #1   Title Independent with a HEP.   Time 6   Period Weeks   Status Achieved     PT LONG TERM GOAL #2   Title Stand 30 minutes with a pain-level not to exceed 3/10.   Time 6   Period Weeks   Status Achieved  3/10 10/08/15     PT LONG TERM GOAL #3   Title Walk a community distance with pain not > 3/10.   Time 6   Period Weeks   Status On-going  4-5/10 10/08/15     PT LONG TERM GOAL #4   Title Normal gait pattern.   Time 6   Period Weeks   Status On-going  antalgic gait 10/08/15               Plan - 10/12/15 0955    Clinical Impression Statement Patient reports she is off work for  another 4 weeks. She responded well to [facility] although no change in ROM noted immediately. She demonstrates greater tightness in gastroc than soleus. Unmet goals are ongoing.   Rehab Potential Excellent   PT Frequency 2x / week   PT Duration 6 weeks   PT Treatment/Interventions ADLs/Self Care Home Management;Cryotherapy;Electrical Stimulation;Moist Heat;Therapeutic exercise;Therapeutic activities;Ultrasound;Manual techniques;Passive range of motion;Vasopneumatic Device;Dry needling;Patient/family education   PT Next Visit Plan Assess DN; Continue with modalities and manual therapy for pain/tone control per MPT POC.    PT Home Exercise Plan HEP- gastroc, PF stretch   Consulted and Agree with Plan of Care Patient      Patient will benefit from skilled therapeutic intervention in order to improve the following deficits and impairments:  Pain, Decreased activity tolerance, Decreased range of motion  Visit Diagnosis: Stiffness of right ankle, not elsewhere classified - Plan: PT plan of care cert/re-cert  Pain in right foot - Plan: PT plan of care cert/re-cert     Problem List Patient Active Problem List   Diagnosis Date Noted  . GERD 01/19/2010  . CONSTIPATION, CHRONIC 01/19/2010  . ABDOMINAL PAIN 01/19/2010    Madelyn Flavors PT 10/12/2015, 11:35 AM  [facility] [address] [place], [place], [address] Phone: [phone]   Fax:  [phone]  Name: Elizabeth Cohen MRN: [id number] Date of Birth: Mar 21, 1960

## 2015-10-16 ENCOUNTER — Encounter: Payer: BLUE CROSS/BLUE SHIELD | Admitting: Physical Therapy

## 2015-10-20 ENCOUNTER — Ambulatory Visit: Payer: BLUE CROSS/BLUE SHIELD | Attending: Podiatry | Admitting: Physical Therapy

## 2015-10-20 DIAGNOSIS — M25671 Stiffness of right ankle, not elsewhere classified: Secondary | ICD-10-CM

## 2015-10-20 DIAGNOSIS — M79671 Pain in right foot: Secondary | ICD-10-CM

## 2015-10-20 NOTE — Therapy (Signed)
[facility] [place], [place], [address] Phone: [phone]   Fax:  [phone]  Physical Therapy Treatment  Patient Details  Name: Elizabeth Cohen MRN: [id number] Date of Birth: [birth year] Referring Provider: Steffanie Rainwater DPM  Encounter Date: 10/20/2015      PT End of Session - 10/20/15 0815    Visit Number 19   Number of Visits 26   Date for PT Re-Evaluation 11/09/15   PT Start Time 0815   PT Stop Time 0918   PT Time Calculation (min) 63 min      No past medical history on file.  Past Surgical History:  Procedure Laterality Date  . COLONOSCOPY  10/04/2010   Procedure: COLONOSCOPY;  Surgeon: Daneil Dolin, MD;  [facility];  Service: Endoscopy;  Laterality: N/A;  8:15AM    There were no vitals filed for this visit.      Subjective Assessment - 10/20/15 0816    Subjective Patient states she was sore after DN, but then it has felt better. No pain today just tender in the one spot.    Limitations Standing   How long can you stand comfortably? 1-1.5 hours   How long can you walk comfortably? 1-1.5 hours   Patient Stated Goals Walk without pain.   Currently in Pain? No/denies            [facility] PT Assessment - 10/20/15 0001      AROM   Overall AROM Comments R ankle DF knee straight 5 deg (10 deg passive); with knee bent 6 deg (11 deg passive)                     [facility] Adult PT Treatment/Exercise - 10/20/15 0001      Modalities   Modalities Electrical Stimulation;Moist Heat     Moist Heat Therapy   Number Minutes Moist Heat 15 Minutes   Moist Heat Location Other (comment)  gastroc/peroneals     Electrical Stimulation   Electrical Stimulation Location R quadratus plantae and lateral lower leg   Electrical Stimulation Action premod   Electrical Stimulation Parameters 80-150 Hz to tolerance x 15 min   Electrical Stimulation Goals Pain     Manual Therapy   Manual Therapy  Myofascial release;Soft tissue mobilization   Soft tissue mobilization to R gastroc/soleus/peroneals   Myofascial Release to R plantar fascia          Trigger Point Dry Needling - 10/20/15 1553    Consent Given? Yes   Education Handout Provided No   Muscles Treated Lower Body Soleus;Quadratus plantae;Gastrocnemius  peroneus longus/brevis   Gastrocnemius Response Twitch response elicited;Palpable increased muscle length   Soleus Response Twitch response elicited;Palpable increased muscle length   Quadatus Plantae Response Twitch response elicited;Palpable increased muscle length                   PT Long Term Goals - 10/08/15 1023      PT LONG TERM GOAL #1   Title Independent with a HEP.   Time 6   Period Weeks   Status Achieved     PT LONG TERM GOAL #2   Title Stand 30 minutes with a pain-level not to exceed 3/10.   Time 6   Period Weeks   Status Achieved  3/10 10/08/15     PT LONG TERM GOAL #3   Title Walk a community distance with pain not > 3/10.   Time 6  Period Weeks   Status On-going  4-5/10 10/08/15     PT LONG TERM GOAL #4   Title Normal gait pattern.   Time 6   Period Weeks   Status On-going  antalgic gait 10/08/15               Plan - 10/20/15 1559    Clinical Impression Statement Patient did very well with initial DN demonstrating significant improvement in ROM and reporting no pain today. She no longer has discoloration at medial calcaneal tubercle and has decreased sensitivity in plantar fascia and calf although some trigger points were still present. She was able to walk over the weekend for 1.5 hours comfortably. Unmet goals are ongoing.   Rehab Potential Excellent   PT Frequency 2x / week   PT Duration 6 weeks   PT Treatment/Interventions ADLs/Self Care Home Management;Cryotherapy;Electrical Stimulation;Moist Heat;Therapeutic exercise;Therapeutic activities;Ultrasound;Manual techniques;Passive range of motion;Vasopneumatic  Device;Dry needling;Patient/family education   PT Next Visit Plan Assess DN; Continue with modalities and manual therapy for pain/tone control per MPT POC.    PT Home Exercise Plan HEP- gastroc, PF stretch      Patient will benefit from skilled therapeutic intervention in order to improve the following deficits and impairments:  Pain, Decreased activity tolerance, Decreased range of motion  Visit Diagnosis: Pain in right foot  Stiffness of right ankle, not elsewhere classified     Problem List Patient Active Problem List   Diagnosis Date Noted  . GERD 01/19/2010  . CONSTIPATION, CHRONIC 01/19/2010  . ABDOMINAL PAIN 01/19/2010    Madelyn Flavors PT 10/20/2015, 4:03 PM  [facility] [address] [place], [place], [address] Phone: [phone]   Fax:  [phone]  Name: Elizabeth Cohen MRN: [id number] Date of Birth: 1960-04-05

## 2015-10-21 ENCOUNTER — Encounter: Payer: BLUE CROSS/BLUE SHIELD | Admitting: Physical Therapy

## 2015-10-22 ENCOUNTER — Ambulatory Visit: Payer: BLUE CROSS/BLUE SHIELD | Admitting: Physical Therapy

## 2015-10-22 ENCOUNTER — Encounter: Payer: Self-pay | Admitting: Physical Therapy

## 2015-10-22 DIAGNOSIS — M25671 Stiffness of right ankle, not elsewhere classified: Secondary | ICD-10-CM

## 2015-10-22 DIAGNOSIS — M79671 Pain in right foot: Secondary | ICD-10-CM | POA: Diagnosis not present

## 2015-10-22 NOTE — Therapy (Signed)
[facility] [place], [place], [address] Phone: [phone]   Fax:  [phone]  Physical Therapy Treatment  Patient Details  Name: MAWADA DEWAARD MRN: [id number] Date of Birth: Jun 28, 1960 Referring Provider: Steffanie Rainwater DPM  Encounter Date: 10/22/2015      PT End of Session - 10/22/15 0856    Visit Number 20   Number of Visits 26   Date for PT Re-Evaluation 11/09/15   PT Start Time 0814   PT Stop Time 0913   PT Time Calculation (min) 59 min   Activity Tolerance Patient tolerated treatment well   Behavior During Therapy [facility] for tasks assessed/performed      History reviewed. No pertinent past medical history.  Past Surgical History:  Procedure Laterality Date  . COLONOSCOPY  10/04/2010   Procedure: COLONOSCOPY;  Surgeon: Daneil Dolin, MD;  [facility];  Service: Endoscopy;  Laterality: N/A;  8:15AM    There were no vitals filed for this visit.      Subjective Assessment - 10/22/15 0826    Subjective Patient reported good response from dry needling and less pain overall   Limitations Standing   How long can you stand comfortably? 1-1.5 hours   How long can you walk comfortably? 1-1.5 hours   Patient Stated Goals Walk without pain.   Currently in Pain? Yes   Pain Score 1    Pain Location Heel   Pain Orientation Right   Pain Descriptors / Indicators Sharp   Pain Type Acute pain   Pain Onset More than a month ago   Pain Frequency Constant   Aggravating Factors  prolong standing   Pain Relieving Factors rest                         [facility] Adult PT Treatment/Exercise - 10/22/15 0001      Electrical Stimulation   Electrical Stimulation Location R quadratus plantae and lateral lower leg   Electrical Stimulation Action premod   Electrical Stimulation Parameters 80-150hz    Electrical Stimulation Goals Pain     Ultrasound   Ultrasound Location right med heel   Ultrasound  Parameters 1.2w/cm2/50%/3.3,mhz x49min   Ultrasound Goals Pain     Vasopneumatic   Number Minutes Vasopneumatic  15 minutes   Vasopnuematic Location  Ankle   Vasopneumatic Pressure Medium     Manual Therapy   Manual Therapy Myofascial release;Soft tissue mobilization   Myofascial Release manual and IASTM to right med heel/plantar fascia                     PT Long Term Goals - 10/08/15 1023      PT LONG TERM GOAL #1   Title Independent with a HEP.   Time 6   Period Weeks   Status Achieved     PT LONG TERM GOAL #2   Title Stand 30 minutes with a pain-level not to exceed 3/10.   Time 6   Period Weeks   Status Achieved  3/10 10/08/15     PT LONG TERM GOAL #3   Title Walk a community distance with pain not > 3/10.   Time 6   Period Weeks   Status On-going  4-5/10 10/08/15     PT LONG TERM GOAL #4   Title Normal gait pattern.   Time 6   Period Weeks   Status On-going  antalgic gait 10/08/15  Plan - 10/22/15 0857    Clinical Impression Statement Patient progressing with good response to treatments. Patient feels less pain in plantar fascia and less tightness in calf. Patient feels like dry needling is helping decrease symptoms and ready to return to work soon. Patient golas ongoing due to pain deficits with standing.   Rehab Potential Excellent   PT Frequency 2x / week   PT Duration 6 weeks   PT Treatment/Interventions ADLs/Self Care Home Management;Cryotherapy;Electrical Stimulation;Moist Heat;Therapeutic exercise;Therapeutic activities;Ultrasound;Manual techniques;Passive range of motion;Vasopneumatic Device;Dry needling;Patient/family education   PT Next Visit Plan continue with modalities and manual therapy/DN for pain/tone control per MPT POC.    Consulted and Agree with Plan of Care Patient      Patient will benefit from skilled therapeutic intervention in order to improve the following deficits and impairments:  Pain, Decreased  activity tolerance, Decreased range of motion  Visit Diagnosis: Pain in right foot  Stiffness of right ankle, not elsewhere classified     Problem List Patient Active Problem List   Diagnosis Date Noted  . GERD 01/19/2010  . CONSTIPATION, CHRONIC 01/19/2010  . ABDOMINAL PAIN 01/19/2010    Giulianna Rocha P , PTA 10/22/2015, 9:13 AM  Ladean Raya, PTA 10/22/15 9:14 AM  [facility] [place], [place], [address] Phone: [phone]   Fax:  [phone]  Name: ARMONII MARKEL MRN: [id number] Date of Birth: 11/27/1960   Patient progressing well with current POC that now includes dry needling.  Mali Applegate MPT

## 2015-10-27 ENCOUNTER — Ambulatory Visit: Payer: BLUE CROSS/BLUE SHIELD | Admitting: Physical Therapy

## 2015-10-27 DIAGNOSIS — M79671 Pain in right foot: Secondary | ICD-10-CM

## 2015-10-27 NOTE — Therapy (Signed)
[facility] [place], [place], [address] Phone: [phone]   Fax:  [phone]  Physical Therapy Treatment  Patient Details  Name: Elizabeth Cohen MRN: [id number] Date of Birth: 06/07/1960 Referring Provider: Steffanie Rainwater DPM  Encounter Date: 10/27/2015      PT End of Session - 10/27/15 0949    Visit Number 21   Number of Visits 26   Date for PT Re-Evaluation 11/09/15   PT Start Time 0946   PT Stop Time 1046   PT Time Calculation (min) 60 min   Activity Tolerance Patient tolerated treatment well   Behavior During Therapy [facility] for tasks assessed/performed      No past medical history on file.  Past Surgical History:  Procedure Laterality Date  . COLONOSCOPY  10/04/2010   Procedure: COLONOSCOPY;  Surgeon: Daneil Dolin, MD;  [facility];  Service: Endoscopy;  Laterality: N/A;  8:15AM    There were no vitals filed for this visit.      Subjective Assessment - 10/27/15 0950    Subjective Patient reported good response from dry needling and less pain overall   Limitations Standing   Patient Stated Goals Walk without pain.   Currently in Pain? Yes   Pain Score 1    Pain Location Heel   Pain Orientation Right   Pain Descriptors / Indicators Tender   Pain Type Acute pain   Pain Onset More than a month ago   Pain Frequency Intermittent   Aggravating Factors  walking   Pain Relieving Factors rest   Effect of Pain on Daily Activities off work            [facility] PT Assessment - 10/27/15 0001      AROM   Overall AROM Comments R ankle DF knee straight 5 active/8 passive                     [facility] Adult PT Treatment/Exercise - 10/27/15 0001      Exercises   Exercises Ankle     Modalities   Modalities Electrical Stimulation;Moist Heat     Moist Heat Therapy   Number Minutes Moist Heat 15 Minutes     Electrical Stimulation   Electrical Stimulation Location R quadratus plantae and  lateral lower leg   Electrical Stimulation Action premod   Electrical Stimulation Parameters 80-150 Hz to tolerance x 15 min   Electrical Stimulation Goals Pain     Manual Therapy   Manual Therapy Myofascial release;Soft tissue mobilization   Soft tissue mobilization to lateral R gastroc/soleus   Myofascial Release to R plantar fascia     Ankle Exercises: Stretches   Gastroc Stretch 2 reps;30 seconds   Other Stretch prostretch 2x 30 sec     Ankle Exercises: Standing   Rocker Board 5 minutes   Other Standing Ankle Exercises on dynadisc pf/df x 20          Trigger Point Dry Needling - 10/27/15 1045    Consent Given? Yes   Education Handout Provided No   Muscles Treated Lower Body Gastrocnemius;Soleus;Quadratus plantae   Gastrocnemius Response Twitch response elicited;Palpable increased muscle length   Soleus Response Twitch response elicited;Palpable increased muscle length   Quadatus Plantae Response Twitch response elicited;Palpable increased muscle length                   PT Long Term Goals - 10/08/15 1023      PT LONG TERM GOAL #  1   Title Independent with a HEP.   Time 6   Period Weeks   Status Achieved     PT LONG TERM GOAL #2   Title Stand 30 minutes with a pain-level not to exceed 3/10.   Time 6   Period Weeks   Status Achieved  3/10 10/08/15     PT LONG TERM GOAL #3   Title Walk a community distance with pain not > 3/10.   Time 6   Period Weeks   Status On-going  4-5/10 10/08/15     PT LONG TERM GOAL #4   Title Normal gait pattern.   Time 6   Period Weeks   Status On-going  antalgic gait 10/08/15               Plan - 10/27/15 1048    Clinical Impression Statement Patient continues to have point tenderness at medial heel. She has decreased tension in plantar fascia but continues to have TPs. Decreased tenderness and tone in R gastroc, lateral soleus still tender. No improvement in ROM today. Unmet goals are onging.   Rehab  Potential Excellent   PT Frequency 2x / week   PT Duration 6 weeks   PT Treatment/Interventions ADLs/Self Care Home Management;Cryotherapy;Electrical Stimulation;Moist Heat;Therapeutic exercise;Therapeutic activities;Ultrasound;Manual techniques;Passive range of motion;Vasopneumatic Device;Dry needling;Patient/family education   PT Next Visit Plan continue with modalities and manual therapy/DN for pain/tone control per MPT POC. Please continue manual to lateral soleus.    Consulted and Agree with Plan of Care Patient      Patient will benefit from skilled therapeutic intervention in order to improve the following deficits and impairments:  Pain, Decreased activity tolerance, Decreased range of motion  Visit Diagnosis: Pain in right foot     Problem List Patient Active Problem List   Diagnosis Date Noted  . GERD 01/19/2010  . CONSTIPATION, CHRONIC 01/19/2010  . ABDOMINAL PAIN 01/19/2010    Madelyn Flavors PT 10/27/2015, 10:55 AM  [facility] [address] [place], [place], [address] Phone: [phone]   Fax:  [phone]  Name: Elizabeth Cohen MRN: [id number] Date of Birth: 27-Dec-1960

## 2015-10-29 ENCOUNTER — Ambulatory Visit: Payer: BLUE CROSS/BLUE SHIELD | Admitting: Physical Therapy

## 2015-10-29 ENCOUNTER — Encounter: Payer: Self-pay | Admitting: Physical Therapy

## 2015-10-29 DIAGNOSIS — M79671 Pain in right foot: Secondary | ICD-10-CM

## 2015-10-29 DIAGNOSIS — M25671 Stiffness of right ankle, not elsewhere classified: Secondary | ICD-10-CM

## 2015-10-29 NOTE — Therapy (Signed)
[facility] [place], [place], [address] Phone: [phone]   Fax:  [phone]  Physical Therapy Treatment  Patient Details  Name: Elizabeth Cohen MRN: [id number] Date of Birth: 08-Aug-1960 Referring Provider: Steffanie Rainwater DPM  Encounter Date: 10/29/2015      PT End of Session - 10/29/15 0942    Visit Number 22   Number of Visits 26   Date for PT Re-Evaluation 11/09/15   PT Start Time 0900   PT Stop Time 0956   PT Time Calculation (min) 56 min   Activity Tolerance Patient tolerated treatment well   Behavior During Therapy [facility] for tasks assessed/performed      History reviewed. No pertinent past medical history.  Past Surgical History:  Procedure Laterality Date  . COLONOSCOPY  10/04/2010   Procedure: COLONOSCOPY;  Surgeon: Daneil Dolin, MD;  [facility];  Service: Endoscopy;  Laterality: N/A;  8:15AM    There were no vitals filed for this visit.      Subjective Assessment - 10/29/15 0913    Subjective Patient reports some soreness yet improvement overall   Limitations Standing   How long can you stand comfortably? 1-1.5 hours   How long can you walk comfortably? 1-1.5 hours   Patient Stated Goals Walk without pain.   Currently in Pain? Yes   Pain Score 2    Pain Location Heel   Pain Orientation Right   Pain Descriptors / Indicators Tender   Pain Type Acute pain   Pain Onset More than a month ago   Pain Frequency Intermittent   Aggravating Factors  walking   Pain Relieving Factors rest                         [facility] Adult PT Treatment/Exercise - 10/29/15 0001      Moist Heat Therapy   Number Minutes Moist Heat 15 Minutes   Moist Heat Location --  foot/calf     Electrical Stimulation   Electrical Stimulation Location right heel and calf   Electrical Stimulation Action premod   Electrical Stimulation Parameters 80-150hhz x44min   Electrical Stimulation Goals Pain     Ultrasound   Ultrasound Location right med heel   Ultrasound Parameters 1.2w/50%/3.52mhz x68min   Ultrasound Goals Pain     Manual Therapy   Manual Therapy Myofascial release;Soft tissue mobilization   Soft tissue mobilization to lateral R gastroc/soleus   Myofascial Release to R plantar fascia     Ankle Exercises: Stretches   Gastroc Stretch 2 reps;30 seconds   Other Stretch prostretch 2x 30 sec     Ankle Exercises: Standing   Rocker Board 5 minutes   Other Standing Ankle Exercises on dynadisc pf/df x 20                     PT Long Term Goals - 10/08/15 1023      PT LONG TERM GOAL #1   Title Independent with a HEP.   Time 6   Period Weeks   Status Achieved     PT LONG TERM GOAL #2   Title Stand 30 minutes with a pain-level not to exceed 3/10.   Time 6   Period Weeks   Status Achieved  3/10 10/08/15     PT LONG TERM GOAL #3   Title Walk a community distance with pain not > 3/10.   Time 6   Period Weeks  Status On-going  4-5/10 10/08/15     PT LONG TERM GOAL #4   Title Normal gait pattern.   Time 6   Period Weeks   Status On-going  antalgic gait 10/08/15               Plan - 10/29/15 0943    Clinical Impression Statement Patient tolerated treatment well today and has reported continued improvement. Patient has responded well to dry needling each visit per patient. Patient has palpable soreness in heel today. Patient remaining goals ongoing due to pain deficits.    Rehab Potential Excellent   PT Frequency 2x / week   PT Duration 6 weeks   PT Treatment/Interventions ADLs/Self Care Home Management;Cryotherapy;Electrical Stimulation;Moist Heat;Therapeutic exercise;Therapeutic activities;Ultrasound;Manual techniques;Passive range of motion;Vasopneumatic Device;Dry needling;Patient/family education   PT Next Visit Plan continue with modalities and manual therapy/DN for pain/tone control per MPT POC. Please continue manual to lateral soleus.     Consulted and Agree with Plan of Care Patient      Patient will benefit from skilled therapeutic intervention in order to improve the following deficits and impairments:  Pain, Decreased activity tolerance, Decreased range of motion  Visit Diagnosis: Pain in right foot  Stiffness of right ankle, not elsewhere classified     Problem List Patient Active Problem List   Diagnosis Date Noted  . GERD 01/19/2010  . CONSTIPATION, CHRONIC 01/19/2010  . ABDOMINAL PAIN 01/19/2010    Phillips Climes, PTA 10/29/2015, 10:00 AM  [facility] [place], [place], [address] Phone: [phone]   Fax:  [phone]  Name: Elizabeth Cohen MRN: [id number] Date of Birth: [birth year]

## 2015-11-03 ENCOUNTER — Ambulatory Visit: Payer: BLUE CROSS/BLUE SHIELD | Admitting: Physical Therapy

## 2015-11-03 DIAGNOSIS — M79671 Pain in right foot: Secondary | ICD-10-CM

## 2015-11-03 NOTE — Therapy (Signed)
[facility] [address] [place], [place], [address] Phone: [phone]   Fax:  [phone]  Physical Therapy Treatment  Patient Details  Name: Elizabeth Cohen MRN: [id number] Date of Birth: 01/29/1961 Referring Provider: Cody Drake DPM  Encounter Date: 11/03/2015      PT End of Session - 11/03/15 1032    Visit Number 23   Number of Visits 26   Date for PT Re-Evaluation 11/09/15   PT Start Time 1033   PT Stop Time 1126   PT Time Calculation (min) 53 min   Activity Tolerance Patient tolerated treatment well   Behavior During Therapy [facility] for tasks assessed/performed      No past medical history on file.  Past Surgical History:  Procedure Laterality Date  . COLONOSCOPY  10/04/2010   Procedure: COLONOSCOPY;  Surgeon: Robert M Rourk, MD;  [facility];  Service: Endoscopy;  Laterality: N/A;  8:15AM    There were no vitals filed for this visit.      Subjective Assessment - 11/03/15 1033    Subjective Patient reports 80-85% improvement in foot pain. Just tender today no pain. She did a lot of walking all day yesterday and pain was 3-4/10.   Limitations Standing   How long can you stand comfortably? 1-1.5 hours   How long can you walk comfortably? 1-1.5 hours   Patient Stated Goals Walk without pain.   Currently in Pain? No/denies                         [facility] Adult PT Treatment/Exercise - 11/03/15 0001      Modalities   Modalities Electrical Stimulation;Moist Heat     Moist Heat Therapy   Number Minutes Moist Heat 15 Minutes   Moist Heat Location Other (comment)  R heel and along R tibia     Electrical Stimulation   Electrical Stimulation Location R heel and lower leg 80-150 Hz to tolerance x 15 min   Electrical Stimulation Goals Pain     Manual Therapy   Manual Therapy Soft tissue mobilization;Myofascial release   Soft tissue mobilization to R gastroc/soleus/flexdig/post tib   Myofascial  Release to R plantar fascia          Trigger Point Dry Needling - 11/03/15 1305    Consent Given? Yes   Education Handout Provided No   Muscles Treated Lower Body Quadratus plantae;Soleus  tibialis posterior and flexor digitorum   Soleus Response Twitch response elicited;Palpable increased muscle length   Quadatus Plantae Response Twitch response elicited;Palpable increased muscle length                   PT Long Term Goals - 11/03/15 1306      PT LONG TERM GOAL #1   Title Independent with a HEP.   Time 6   Period Weeks   Status Achieved     PT LONG TERM GOAL #2   Title Stand 30 minutes with a pain-level not to exceed 3/10.   Time 6   Period Weeks   Status Achieved     PT LONG TERM GOAL #3   Title Walk a community distance with pain not > 3/10.   Baseline pain reported 3-4/10 on 11/03/15   Time 6   Period Weeks   Status On-going     PT LONG TERM GOAL #4   Title Normal gait pattern.   Time 6   Period Weeks   Status   Achieved               Plan - 11/03/15 1038    Clinical Impression Statement Patient presents today with only tenderness in R heel. She has met all of her goals except her pain goal with community walking. This is progressing and is significantly better than at eval. She has a non-antalgic gait pattern. Patient amb with R hip ER, but doesn't demonstrate tightness or weakness in rotator muscles. She has responded well to [facility] and has only mild point tenderness at R medial heel. Patient feels she is ready to RTW. She may benefit from periodic treatments of TPDN as she is on her feet constantly at work.   Rehab Potential Excellent   PT Frequency 2x / week   PT Duration 6 weeks   PT Treatment/Interventions ADLs/Self Care Home Management;Cryotherapy;Electrical Stimulation;Moist Heat;Therapeutic exercise;Therapeutic activities;Ultrasound;Manual techniques;Passive range of motion;Vasopneumatic Device;Dry needling;Patient/family education   PT  Next Visit Plan continue 1-2 more visits, then D/C to HEP.   PT Home Exercise Plan HEP- gastroc, PF stretch   Consulted and Agree with Plan of Care Patient      Patient will benefit from skilled therapeutic intervention in order to improve the following deficits and impairments:  Pain, Decreased activity tolerance, Decreased range of motion  Visit Diagnosis: Pain in right foot     Problem List Patient Active Problem List   Diagnosis Date Noted  . GERD 01/19/2010  . CONSTIPATION, CHRONIC 01/19/2010  . ABDOMINAL PAIN 01/19/2010    Madelyn Flavors PT 11/03/2015, 1:17 PM  [facility] [address] [place], [place], [address] Phone: [phone]   Fax:  [phone]  Name: Elizabeth Cohen MRN: [id number] Date of Birth: Dec 09, 1960

## 2015-11-06 ENCOUNTER — Ambulatory Visit: Payer: BLUE CROSS/BLUE SHIELD | Admitting: *Deleted

## 2015-11-06 DIAGNOSIS — M25671 Stiffness of right ankle, not elsewhere classified: Secondary | ICD-10-CM

## 2015-11-06 DIAGNOSIS — M79671 Pain in right foot: Secondary | ICD-10-CM | POA: Diagnosis not present

## 2015-11-06 NOTE — Therapy (Addendum)
[facility] [place], [place], [address] Phone: [phone]   Fax:  [phone]  Physical Therapy Treatment  Patient Details  Name: Elizabeth Cohen MRN: [id number] Date of Birth: [birth year] Referring Provider: Steffanie Rainwater DPM  Encounter Date: 11/06/2015      PT End of Session - 11/06/15 1253    Visit Number 24   Number of Visits 26   Date for PT Re-Evaluation 11/09/15   PT Start Time 1030   PT Stop Time 1119   PT Time Calculation (min) 49 min      No past medical history on file.  Past Surgical History:  Procedure Laterality Date  . COLONOSCOPY  10/04/2010   Procedure: COLONOSCOPY;  Surgeon: Daneil Dolin, MD;  [facility];  Service: Endoscopy;  Laterality: N/A;  8:15AM    There were no vitals filed for this visit.      Subjective Assessment - 11/06/15 1033    Subjective Patient reports 80-85% improvement in foot pain. Just tender today no pain. She did a lot of walking all day yesterday and pain was 3-4/10. DC today   Limitations Standing   How long can you stand comfortably? 1-1.5 hours   How long can you walk comfortably? 1-1.5 hours   Patient Stated Goals Walk without pain.   Currently in Pain? No/denies                         [facility] Adult PT Treatment/Exercise - 11/06/15 0001      Exercises   Exercises Ankle     Modalities   Modalities Electrical Stimulation;Moist Heat     Moist Heat Therapy   Number Minutes Moist Heat 15 Minutes   Moist Heat Location Other (comment)     Electrical Stimulation   Electrical Stimulation Location R heel and lower leg 80-150 Hz to tolerance x 15 min   Electrical Stimulation Goals Pain     Ultrasound   Ultrasound Location RT medial  heel   Ultrasound Parameters 1.2 w/cm2 50% x 10 mins   Ultrasound Goals Pain     Manual Therapy   Manual Therapy Soft tissue mobilization;Myofascial release   Manual therapy comments In supine STW/M to  patient's right plantar fascia and Achilles region                      PT Long Term Goals - 11/06/15 1101      PT LONG TERM GOAL #1   Title Independent with a HEP.   Time 6   Period Weeks   Status Achieved     PT LONG TERM GOAL #2   Title Stand 30 minutes with a pain-level not to exceed 3/10.   Period Weeks   Status Achieved     PT LONG TERM GOAL #3   Title Walk a community distance with pain not > 3/10.   Baseline pain reported 3-4/10 on 11/03/15   Time 6   Period Weeks   Status Not Met  pain is 4-6/10 at times     PT LONG TERM GOAL #4   Title Normal gait pattern.   Time 6   Period Weeks   Status Achieved               Plan - 11/06/15 1034    Clinical Impression Statement Pt did great with Rx today and has Met all goals except for walking community distances and pain >  3/10. Pain was 4-6/10 at timmes. Pt started wearing her new orthotics today and hopes this will bring her walking pain down.   Rehab Potential Excellent   PT Frequency 2x / week   PT Duration 6 weeks   PT Treatment/Interventions ADLs/Self Care Home Management;Cryotherapy;Electrical Stimulation;Moist Heat;Therapeutic exercise;Therapeutic activities;Ultrasound;Manual techniques;Passive range of motion;Vasopneumatic Device;Dry needling;Patient/family education   PT Next Visit Plan  D/C to HEP.   PT Home Exercise Plan HEP- gastroc, PF stretch   Consulted and Agree with Plan of Care Patient      Patient will benefit from skilled therapeutic intervention in order to improve the following deficits and impairments:  Pain, Decreased activity tolerance, Decreased range of motion  Visit Diagnosis: Pain in right foot  Stiffness of right ankle, not elsewhere classified     Problem List Patient Active Problem List   Diagnosis Date Noted  . GERD 01/19/2010  . CONSTIPATION, CHRONIC 01/19/2010  . ABDOMINAL PAIN 01/19/2010    Emeril Stille,CHRIS, PTA 11/06/2015, 1:20 PM  [facility] [address] [place], [place], [address] Phone: [phone]   Fax:  [phone]  Name: Elizabeth Cohen MRN: [id number] Date of Birth: 1960/02/20  PHYSICAL THERAPY DISCHARGE SUMMARY  Visits from Start of Care: 24.  Current functional level related to goals / functional outcomes: See above.   Remaining deficits: Goal #3 unmet.   Education / Equipment: HEP. Plan: Patient agrees to discharge.  Patient goals were partially met. Patient is being discharged due to being pleased with the current functional level.  ?????         Mali Applegate MPT

## 2016-01-15 ENCOUNTER — Ambulatory Visit (HOSPITAL_COMMUNITY)
Admission: RE | Admit: 2016-01-15 | Discharge: 2016-01-15 | Disposition: A | Discharge status: Home / Self Care | Payer: BLUE CROSS/BLUE SHIELD | Source: Ambulatory Visit | Attending: Registered Nurse | Admitting: Registered Nurse

## 2016-01-15 ENCOUNTER — Other Ambulatory Visit (HOSPITAL_COMMUNITY): Payer: Self-pay | Admitting: Registered Nurse

## 2016-01-15 DIAGNOSIS — K219 Gastro-esophageal reflux disease without esophagitis: Secondary | ICD-10-CM | POA: Diagnosis not present

## 2016-01-15 DIAGNOSIS — R1012 Left upper quadrant pain: Secondary | ICD-10-CM | POA: Diagnosis not present

## 2016-01-15 DIAGNOSIS — Z6824 Body mass index (BMI) 24.0-24.9, adult: Secondary | ICD-10-CM | POA: Diagnosis not present

## 2016-01-15 DIAGNOSIS — R110 Nausea: Secondary | ICD-10-CM

## 2016-01-15 DIAGNOSIS — Z1389 Encounter for screening for other disorder: Secondary | ICD-10-CM | POA: Diagnosis not present

## 2016-01-15 DIAGNOSIS — R109 Unspecified abdominal pain: Secondary | ICD-10-CM | POA: Diagnosis not present

## 2016-01-15 DIAGNOSIS — K529 Noninfective gastroenteritis and colitis, unspecified: Secondary | ICD-10-CM | POA: Diagnosis not present

## 2016-01-20 ENCOUNTER — Encounter: Payer: Self-pay | Admitting: Internal Medicine

## 2016-01-25 DIAGNOSIS — J40 Bronchitis, not specified as acute or chronic: Secondary | ICD-10-CM | POA: Diagnosis not present

## 2016-01-28 DIAGNOSIS — J40 Bronchitis, not specified as acute or chronic: Secondary | ICD-10-CM | POA: Diagnosis not present

## 2016-01-29 ENCOUNTER — Ambulatory Visit: Payer: BLUE CROSS/BLUE SHIELD | Admitting: Gastroenterology

## 2016-02-15 HISTORY — PX: FOOT SURGERY: SHX648

## 2016-02-17 ENCOUNTER — Ambulatory Visit: Payer: BLUE CROSS/BLUE SHIELD | Admitting: Gastroenterology

## 2016-03-28 DIAGNOSIS — J020 Streptococcal pharyngitis: Secondary | ICD-10-CM | POA: Diagnosis not present

## 2016-03-28 DIAGNOSIS — J029 Acute pharyngitis, unspecified: Secondary | ICD-10-CM | POA: Diagnosis not present

## 2016-03-28 DIAGNOSIS — J111 Influenza due to unidentified influenza virus with other respiratory manifestations: Secondary | ICD-10-CM | POA: Diagnosis not present

## 2016-04-20 DIAGNOSIS — M722 Plantar fascial fibromatosis: Secondary | ICD-10-CM | POA: Diagnosis not present

## 2016-04-20 DIAGNOSIS — Z6823 Body mass index (BMI) 23.0-23.9, adult: Secondary | ICD-10-CM | POA: Diagnosis not present

## 2016-04-20 DIAGNOSIS — J029 Acute pharyngitis, unspecified: Secondary | ICD-10-CM | POA: Diagnosis not present

## 2016-04-20 DIAGNOSIS — Z1389 Encounter for screening for other disorder: Secondary | ICD-10-CM | POA: Diagnosis not present

## 2016-04-26 DIAGNOSIS — M79673 Pain in unspecified foot: Secondary | ICD-10-CM | POA: Diagnosis not present

## 2016-04-26 DIAGNOSIS — M722 Plantar fascial fibromatosis: Secondary | ICD-10-CM | POA: Diagnosis not present

## 2016-05-03 DIAGNOSIS — M79672 Pain in left foot: Secondary | ICD-10-CM | POA: Diagnosis not present

## 2016-05-03 DIAGNOSIS — M79671 Pain in right foot: Secondary | ICD-10-CM | POA: Diagnosis not present

## 2016-05-03 DIAGNOSIS — M722 Plantar fascial fibromatosis: Secondary | ICD-10-CM | POA: Diagnosis not present

## 2016-05-04 DIAGNOSIS — Z1239 Encounter for other screening for malignant neoplasm of breast: Secondary | ICD-10-CM | POA: Diagnosis not present

## 2016-05-04 DIAGNOSIS — Z6824 Body mass index (BMI) 24.0-24.9, adult: Secondary | ICD-10-CM | POA: Diagnosis not present

## 2016-05-04 DIAGNOSIS — Z01419 Encounter for gynecological examination (general) (routine) without abnormal findings: Secondary | ICD-10-CM | POA: Diagnosis not present

## 2016-05-11 DIAGNOSIS — M722 Plantar fascial fibromatosis: Secondary | ICD-10-CM | POA: Diagnosis not present

## 2016-05-12 DIAGNOSIS — M722 Plantar fascial fibromatosis: Secondary | ICD-10-CM | POA: Diagnosis not present

## 2016-06-28 DIAGNOSIS — G5792 Unspecified mononeuropathy of left lower limb: Secondary | ICD-10-CM | POA: Diagnosis not present

## 2016-06-28 DIAGNOSIS — G5791 Unspecified mononeuropathy of right lower limb: Secondary | ICD-10-CM | POA: Diagnosis not present

## 2016-07-05 DIAGNOSIS — M79672 Pain in left foot: Secondary | ICD-10-CM | POA: Diagnosis not present

## 2016-07-05 DIAGNOSIS — M79671 Pain in right foot: Secondary | ICD-10-CM | POA: Diagnosis not present

## 2016-07-05 DIAGNOSIS — G5792 Unspecified mononeuropathy of left lower limb: Secondary | ICD-10-CM | POA: Diagnosis not present

## 2016-07-05 DIAGNOSIS — G5791 Unspecified mononeuropathy of right lower limb: Secondary | ICD-10-CM | POA: Diagnosis not present

## 2016-07-12 DIAGNOSIS — G5791 Unspecified mononeuropathy of right lower limb: Secondary | ICD-10-CM | POA: Diagnosis not present

## 2016-07-12 DIAGNOSIS — G5792 Unspecified mononeuropathy of left lower limb: Secondary | ICD-10-CM | POA: Diagnosis not present

## 2016-07-12 DIAGNOSIS — M79672 Pain in left foot: Secondary | ICD-10-CM | POA: Diagnosis not present

## 2016-07-12 DIAGNOSIS — M79671 Pain in right foot: Secondary | ICD-10-CM | POA: Diagnosis not present

## 2016-07-18 DIAGNOSIS — Z6825 Body mass index (BMI) 25.0-25.9, adult: Secondary | ICD-10-CM | POA: Diagnosis not present

## 2016-07-18 DIAGNOSIS — G43909 Migraine, unspecified, not intractable, without status migrainosus: Secondary | ICD-10-CM | POA: Diagnosis not present

## 2016-07-18 DIAGNOSIS — F429 Obsessive-compulsive disorder, unspecified: Secondary | ICD-10-CM | POA: Diagnosis not present

## 2016-07-18 DIAGNOSIS — F330 Major depressive disorder, recurrent, mild: Secondary | ICD-10-CM | POA: Diagnosis not present

## 2016-07-18 DIAGNOSIS — K219 Gastro-esophageal reflux disease without esophagitis: Secondary | ICD-10-CM | POA: Diagnosis not present

## 2016-07-19 DIAGNOSIS — M79672 Pain in left foot: Secondary | ICD-10-CM | POA: Diagnosis not present

## 2016-07-19 DIAGNOSIS — M79671 Pain in right foot: Secondary | ICD-10-CM | POA: Diagnosis not present

## 2016-07-19 DIAGNOSIS — G5792 Unspecified mononeuropathy of left lower limb: Secondary | ICD-10-CM | POA: Diagnosis not present

## 2016-07-19 DIAGNOSIS — G5791 Unspecified mononeuropathy of right lower limb: Secondary | ICD-10-CM | POA: Diagnosis not present

## 2016-07-21 DIAGNOSIS — Z1231 Encounter for screening mammogram for malignant neoplasm of breast: Secondary | ICD-10-CM | POA: Diagnosis not present

## 2016-07-28 DIAGNOSIS — G5753 Tarsal tunnel syndrome, bilateral lower limbs: Secondary | ICD-10-CM | POA: Diagnosis not present

## 2016-07-28 DIAGNOSIS — K219 Gastro-esophageal reflux disease without esophagitis: Secondary | ICD-10-CM | POA: Diagnosis not present

## 2016-07-28 DIAGNOSIS — Z7951 Long term (current) use of inhaled steroids: Secondary | ICD-10-CM | POA: Diagnosis not present

## 2016-07-28 DIAGNOSIS — Z882 Allergy status to sulfonamides status: Secondary | ICD-10-CM | POA: Diagnosis not present

## 2016-07-28 DIAGNOSIS — J302 Other seasonal allergic rhinitis: Secondary | ICD-10-CM | POA: Diagnosis not present

## 2016-07-28 DIAGNOSIS — Z79899 Other long term (current) drug therapy: Secondary | ICD-10-CM | POA: Diagnosis not present

## 2016-07-28 DIAGNOSIS — F419 Anxiety disorder, unspecified: Secondary | ICD-10-CM | POA: Diagnosis not present

## 2016-07-28 DIAGNOSIS — Z91048 Other nonmedicinal substance allergy status: Secondary | ICD-10-CM | POA: Diagnosis not present

## 2016-07-28 DIAGNOSIS — G5752 Tarsal tunnel syndrome, left lower limb: Secondary | ICD-10-CM | POA: Diagnosis not present

## 2016-07-28 DIAGNOSIS — G5751 Tarsal tunnel syndrome, right lower limb: Secondary | ICD-10-CM | POA: Diagnosis not present

## 2016-07-28 DIAGNOSIS — F329 Major depressive disorder, single episode, unspecified: Secondary | ICD-10-CM | POA: Diagnosis not present

## 2016-07-28 DIAGNOSIS — Z888 Allergy status to other drugs, medicaments and biological substances status: Secondary | ICD-10-CM | POA: Diagnosis not present

## 2016-07-28 DIAGNOSIS — Z91040 Latex allergy status: Secondary | ICD-10-CM | POA: Diagnosis not present

## 2016-07-28 DIAGNOSIS — Z886 Allergy status to analgesic agent status: Secondary | ICD-10-CM | POA: Diagnosis not present

## 2016-08-16 DIAGNOSIS — H6691 Otitis media, unspecified, right ear: Secondary | ICD-10-CM | POA: Diagnosis not present

## 2016-08-16 DIAGNOSIS — Z6826 Body mass index (BMI) 26.0-26.9, adult: Secondary | ICD-10-CM | POA: Diagnosis not present

## 2016-08-16 DIAGNOSIS — E663 Overweight: Secondary | ICD-10-CM | POA: Diagnosis not present

## 2016-08-16 DIAGNOSIS — J029 Acute pharyngitis, unspecified: Secondary | ICD-10-CM | POA: Diagnosis not present

## 2016-10-11 DIAGNOSIS — R5383 Other fatigue: Secondary | ICD-10-CM | POA: Diagnosis not present

## 2016-10-11 DIAGNOSIS — E559 Vitamin D deficiency, unspecified: Secondary | ICD-10-CM | POA: Diagnosis not present

## 2016-10-11 DIAGNOSIS — E2839 Other primary ovarian failure: Secondary | ICD-10-CM | POA: Diagnosis not present

## 2016-10-11 DIAGNOSIS — Z0000 Encounter for general adult medical examination without abnormal findings: Secondary | ICD-10-CM | POA: Diagnosis not present

## 2016-10-11 DIAGNOSIS — F331 Major depressive disorder, recurrent, moderate: Secondary | ICD-10-CM | POA: Diagnosis not present

## 2016-10-11 DIAGNOSIS — Z13220 Encounter for screening for lipoid disorders: Secondary | ICD-10-CM | POA: Diagnosis not present

## 2016-11-01 DIAGNOSIS — F331 Major depressive disorder, recurrent, moderate: Secondary | ICD-10-CM | POA: Diagnosis not present

## 2016-11-01 DIAGNOSIS — E559 Vitamin D deficiency, unspecified: Secondary | ICD-10-CM | POA: Diagnosis not present

## 2016-11-01 DIAGNOSIS — R5383 Other fatigue: Secondary | ICD-10-CM | POA: Diagnosis not present

## 2016-11-01 DIAGNOSIS — E2839 Other primary ovarian failure: Secondary | ICD-10-CM | POA: Diagnosis not present

## 2016-11-02 ENCOUNTER — Other Ambulatory Visit (HOSPITAL_COMMUNITY): Payer: Self-pay | Admitting: Internal Medicine

## 2016-11-02 DIAGNOSIS — E2839 Other primary ovarian failure: Secondary | ICD-10-CM

## 2016-11-03 DIAGNOSIS — M79662 Pain in left lower leg: Secondary | ICD-10-CM | POA: Diagnosis not present

## 2016-11-03 DIAGNOSIS — M84372A Stress fracture, left ankle, initial encounter for fracture: Secondary | ICD-10-CM | POA: Diagnosis not present

## 2016-11-09 ENCOUNTER — Ambulatory Visit (HOSPITAL_COMMUNITY)
Admission: RE | Admit: 2016-11-09 | Discharge: 2016-11-09 | Disposition: A | Discharge status: Home / Self Care | Payer: BLUE CROSS/BLUE SHIELD | Source: Ambulatory Visit | Attending: Internal Medicine | Admitting: Internal Medicine

## 2016-11-09 DIAGNOSIS — E2839 Other primary ovarian failure: Secondary | ICD-10-CM | POA: Insufficient documentation

## 2016-11-09 DIAGNOSIS — M85851 Other specified disorders of bone density and structure, right thigh: Secondary | ICD-10-CM | POA: Diagnosis not present

## 2016-11-09 DIAGNOSIS — Z780 Asymptomatic menopausal state: Secondary | ICD-10-CM | POA: Diagnosis not present

## 2018-01-08 DIAGNOSIS — C44219 Basal cell carcinoma of skin of left ear and external auricular canal: Secondary | ICD-10-CM | POA: Diagnosis not present

## 2018-01-08 DIAGNOSIS — L570 Actinic keratosis: Secondary | ICD-10-CM | POA: Diagnosis not present

## 2018-01-08 DIAGNOSIS — L821 Other seborrheic keratosis: Secondary | ICD-10-CM | POA: Diagnosis not present

## 2018-01-08 DIAGNOSIS — Z85828 Personal history of other malignant neoplasm of skin: Secondary | ICD-10-CM | POA: Diagnosis not present

## 2018-01-09 DIAGNOSIS — R42 Dizziness and giddiness: Secondary | ICD-10-CM | POA: Diagnosis not present

## 2018-01-09 DIAGNOSIS — F419 Anxiety disorder, unspecified: Secondary | ICD-10-CM | POA: Diagnosis not present

## 2018-01-09 DIAGNOSIS — T50905A Adverse effect of unspecified drugs, medicaments and biological substances, initial encounter: Secondary | ICD-10-CM | POA: Diagnosis not present

## 2018-01-09 DIAGNOSIS — R0602 Shortness of breath: Secondary | ICD-10-CM | POA: Diagnosis not present

## 2018-01-09 DIAGNOSIS — Z79899 Other long term (current) drug therapy: Secondary | ICD-10-CM | POA: Diagnosis not present

## 2018-01-09 DIAGNOSIS — F329 Major depressive disorder, single episode, unspecified: Secondary | ICD-10-CM | POA: Diagnosis not present

## 2018-01-09 DIAGNOSIS — Z87891 Personal history of nicotine dependence: Secondary | ICD-10-CM | POA: Diagnosis not present

## 2018-01-09 DIAGNOSIS — R55 Syncope and collapse: Secondary | ICD-10-CM | POA: Diagnosis not present

## 2018-01-14 HISTORY — PX: EXTERNAL EAR SURGERY: SHX627

## 2018-01-16 DIAGNOSIS — Z780 Asymptomatic menopausal state: Secondary | ICD-10-CM | POA: Diagnosis not present

## 2018-01-16 DIAGNOSIS — Z803 Family history of malignant neoplasm of breast: Secondary | ICD-10-CM | POA: Diagnosis not present

## 2018-01-16 DIAGNOSIS — Z6831 Body mass index (BMI) 31.0-31.9, adult: Secondary | ICD-10-CM | POA: Diagnosis not present

## 2018-01-16 DIAGNOSIS — Z01419 Encounter for gynecological examination (general) (routine) without abnormal findings: Secondary | ICD-10-CM | POA: Diagnosis not present

## 2018-01-19 DIAGNOSIS — J019 Acute sinusitis, unspecified: Secondary | ICD-10-CM | POA: Diagnosis not present

## 2018-01-24 DIAGNOSIS — Z803 Family history of malignant neoplasm of breast: Secondary | ICD-10-CM | POA: Diagnosis not present

## 2018-02-08 DIAGNOSIS — C44219 Basal cell carcinoma of skin of left ear and external auricular canal: Secondary | ICD-10-CM | POA: Diagnosis not present

## 2018-04-09 DIAGNOSIS — R0609 Other forms of dyspnea: Secondary | ICD-10-CM | POA: Diagnosis not present

## 2018-04-09 DIAGNOSIS — J309 Allergic rhinitis, unspecified: Secondary | ICD-10-CM | POA: Diagnosis not present

## 2018-04-09 DIAGNOSIS — R5383 Other fatigue: Secondary | ICD-10-CM | POA: Diagnosis not present

## 2018-04-19 DIAGNOSIS — M545 Low back pain: Secondary | ICD-10-CM | POA: Diagnosis not present

## 2018-04-19 DIAGNOSIS — J309 Allergic rhinitis, unspecified: Secondary | ICD-10-CM | POA: Diagnosis not present

## 2018-04-19 DIAGNOSIS — G8929 Other chronic pain: Secondary | ICD-10-CM | POA: Diagnosis not present

## 2018-05-08 DIAGNOSIS — H8113 Benign paroxysmal vertigo, bilateral: Secondary | ICD-10-CM | POA: Diagnosis not present

## 2018-06-11 DIAGNOSIS — M48061 Spinal stenosis, lumbar region without neurogenic claudication: Secondary | ICD-10-CM | POA: Diagnosis not present

## 2018-06-11 DIAGNOSIS — M545 Low back pain: Secondary | ICD-10-CM | POA: Diagnosis not present

## 2018-06-11 DIAGNOSIS — M5136 Other intervertebral disc degeneration, lumbar region: Secondary | ICD-10-CM | POA: Diagnosis not present

## 2018-06-11 DIAGNOSIS — M47816 Spondylosis without myelopathy or radiculopathy, lumbar region: Secondary | ICD-10-CM | POA: Diagnosis not present

## 2018-06-25 DIAGNOSIS — M5136 Other intervertebral disc degeneration, lumbar region: Secondary | ICD-10-CM | POA: Diagnosis not present

## 2018-07-23 DIAGNOSIS — M5136 Other intervertebral disc degeneration, lumbar region: Secondary | ICD-10-CM | POA: Diagnosis not present

## 2018-07-23 DIAGNOSIS — M5416 Radiculopathy, lumbar region: Secondary | ICD-10-CM | POA: Diagnosis not present

## 2018-07-23 DIAGNOSIS — M47816 Spondylosis without myelopathy or radiculopathy, lumbar region: Secondary | ICD-10-CM | POA: Diagnosis not present

## 2018-07-23 DIAGNOSIS — M4807 Spinal stenosis, lumbosacral region: Secondary | ICD-10-CM | POA: Diagnosis not present

## 2018-08-07 DIAGNOSIS — M48061 Spinal stenosis, lumbar region without neurogenic claudication: Secondary | ICD-10-CM | POA: Diagnosis not present

## 2018-08-07 DIAGNOSIS — M5136 Other intervertebral disc degeneration, lumbar region: Secondary | ICD-10-CM | POA: Diagnosis not present

## 2018-09-05 DIAGNOSIS — M5136 Other intervertebral disc degeneration, lumbar region: Secondary | ICD-10-CM | POA: Diagnosis not present

## 2018-09-05 DIAGNOSIS — M48061 Spinal stenosis, lumbar region without neurogenic claudication: Secondary | ICD-10-CM | POA: Diagnosis not present

## 2018-09-05 DIAGNOSIS — M25552 Pain in left hip: Secondary | ICD-10-CM | POA: Diagnosis not present

## 2018-09-05 DIAGNOSIS — M1612 Unilateral primary osteoarthritis, left hip: Secondary | ICD-10-CM | POA: Diagnosis not present

## 2018-09-05 DIAGNOSIS — M4317 Spondylolisthesis, lumbosacral region: Secondary | ICD-10-CM | POA: Diagnosis not present

## 2018-09-05 DIAGNOSIS — M47816 Spondylosis without myelopathy or radiculopathy, lumbar region: Secondary | ICD-10-CM | POA: Diagnosis not present

## 2018-09-20 DIAGNOSIS — Z85828 Personal history of other malignant neoplasm of skin: Secondary | ICD-10-CM | POA: Diagnosis not present

## 2018-09-20 DIAGNOSIS — L570 Actinic keratosis: Secondary | ICD-10-CM | POA: Diagnosis not present

## 2018-09-20 DIAGNOSIS — D2239 Melanocytic nevi of other parts of face: Secondary | ICD-10-CM | POA: Diagnosis not present

## 2018-09-20 DIAGNOSIS — L821 Other seborrheic keratosis: Secondary | ICD-10-CM | POA: Diagnosis not present

## 2018-09-20 DIAGNOSIS — C44622 Squamous cell carcinoma of skin of right upper limb, including shoulder: Secondary | ICD-10-CM | POA: Diagnosis not present

## 2018-09-20 DIAGNOSIS — D224 Melanocytic nevi of scalp and neck: Secondary | ICD-10-CM | POA: Diagnosis not present

## 2018-09-20 HISTORY — PX: SKIN CANCER EXCISION: SHX779

## 2018-09-25 DIAGNOSIS — L03113 Cellulitis of right upper limb: Secondary | ICD-10-CM | POA: Diagnosis not present

## 2018-09-25 DIAGNOSIS — Z20828 Contact with and (suspected) exposure to other viral communicable diseases: Secondary | ICD-10-CM | POA: Diagnosis not present

## 2018-09-25 DIAGNOSIS — T8149XA Infection following a procedure, other surgical site, initial encounter: Secondary | ICD-10-CM | POA: Diagnosis not present

## 2018-10-25 DIAGNOSIS — Z79899 Other long term (current) drug therapy: Secondary | ICD-10-CM | POA: Diagnosis not present

## 2018-10-25 DIAGNOSIS — R001 Bradycardia, unspecified: Secondary | ICD-10-CM | POA: Diagnosis not present

## 2018-10-25 DIAGNOSIS — I10 Essential (primary) hypertension: Secondary | ICD-10-CM | POA: Diagnosis not present

## 2018-10-25 DIAGNOSIS — R600 Localized edema: Secondary | ICD-10-CM | POA: Diagnosis not present

## 2018-10-25 DIAGNOSIS — R0789 Other chest pain: Secondary | ICD-10-CM | POA: Diagnosis not present

## 2018-10-25 DIAGNOSIS — Z20828 Contact with and (suspected) exposure to other viral communicable diseases: Secondary | ICD-10-CM | POA: Diagnosis not present

## 2018-10-25 DIAGNOSIS — R079 Chest pain, unspecified: Secondary | ICD-10-CM | POA: Diagnosis not present

## 2018-10-25 DIAGNOSIS — R030 Elevated blood-pressure reading, without diagnosis of hypertension: Secondary | ICD-10-CM | POA: Diagnosis not present

## 2018-10-25 DIAGNOSIS — E669 Obesity, unspecified: Secondary | ICD-10-CM | POA: Diagnosis not present

## 2018-10-25 DIAGNOSIS — R0602 Shortness of breath: Secondary | ICD-10-CM | POA: Diagnosis not present

## 2018-10-25 DIAGNOSIS — K219 Gastro-esophageal reflux disease without esophagitis: Secondary | ICD-10-CM | POA: Diagnosis not present

## 2018-10-25 DIAGNOSIS — G43909 Migraine, unspecified, not intractable, without status migrainosus: Secondary | ICD-10-CM | POA: Diagnosis not present

## 2018-10-25 DIAGNOSIS — R609 Edema, unspecified: Secondary | ICD-10-CM | POA: Diagnosis not present

## 2018-10-30 ENCOUNTER — Other Ambulatory Visit (INDEPENDENT_AMBULATORY_CARE_PROVIDER_SITE_OTHER): Payer: Self-pay | Admitting: Internal Medicine

## 2018-10-30 DIAGNOSIS — I10 Essential (primary) hypertension: Secondary | ICD-10-CM | POA: Diagnosis not present

## 2018-10-30 DIAGNOSIS — R609 Edema, unspecified: Secondary | ICD-10-CM | POA: Diagnosis not present

## 2018-10-30 DIAGNOSIS — R0602 Shortness of breath: Secondary | ICD-10-CM | POA: Diagnosis not present

## 2018-11-02 ENCOUNTER — Other Ambulatory Visit (INDEPENDENT_AMBULATORY_CARE_PROVIDER_SITE_OTHER): Payer: Self-pay | Admitting: Internal Medicine

## 2018-11-06 DIAGNOSIS — I10 Essential (primary) hypertension: Secondary | ICD-10-CM | POA: Diagnosis not present

## 2018-11-06 DIAGNOSIS — R0600 Dyspnea, unspecified: Secondary | ICD-10-CM | POA: Diagnosis not present

## 2018-12-10 DIAGNOSIS — R0602 Shortness of breath: Secondary | ICD-10-CM | POA: Diagnosis not present

## 2018-12-10 DIAGNOSIS — Z7689 Persons encountering health services in other specified circumstances: Secondary | ICD-10-CM | POA: Diagnosis not present

## 2018-12-10 DIAGNOSIS — I10 Essential (primary) hypertension: Secondary | ICD-10-CM | POA: Diagnosis not present

## 2018-12-10 DIAGNOSIS — N951 Menopausal and female climacteric states: Secondary | ICD-10-CM | POA: Diagnosis not present

## 2018-12-12 DIAGNOSIS — R0602 Shortness of breath: Secondary | ICD-10-CM | POA: Diagnosis not present

## 2018-12-12 DIAGNOSIS — G479 Sleep disorder, unspecified: Secondary | ICD-10-CM | POA: Diagnosis not present

## 2018-12-12 DIAGNOSIS — I10 Essential (primary) hypertension: Secondary | ICD-10-CM | POA: Diagnosis not present

## 2018-12-19 DIAGNOSIS — Z20828 Contact with and (suspected) exposure to other viral communicable diseases: Secondary | ICD-10-CM | POA: Diagnosis not present

## 2018-12-25 DIAGNOSIS — R0602 Shortness of breath: Secondary | ICD-10-CM | POA: Diagnosis not present

## 2018-12-25 DIAGNOSIS — R609 Edema, unspecified: Secondary | ICD-10-CM | POA: Diagnosis not present

## 2018-12-25 DIAGNOSIS — G4700 Insomnia, unspecified: Secondary | ICD-10-CM | POA: Diagnosis not present

## 2018-12-26 ENCOUNTER — Other Ambulatory Visit (HOSPITAL_COMMUNITY): Payer: Self-pay | Admitting: Respiratory Therapy

## 2018-12-26 ENCOUNTER — Other Ambulatory Visit (HOSPITAL_COMMUNITY): Payer: Self-pay | Admitting: Pulmonary Disease

## 2018-12-26 DIAGNOSIS — R0602 Shortness of breath: Secondary | ICD-10-CM

## 2019-01-01 ENCOUNTER — Other Ambulatory Visit: Payer: Self-pay

## 2019-01-01 ENCOUNTER — Ambulatory Visit (HOSPITAL_COMMUNITY)
Admission: RE | Admit: 2019-01-01 | Discharge: 2019-01-01 | Disposition: A | Discharge status: Home / Self Care | Payer: BC Managed Care – PPO | Source: Ambulatory Visit | Attending: Pulmonary Disease | Admitting: Pulmonary Disease

## 2019-01-01 DIAGNOSIS — R0602 Shortness of breath: Secondary | ICD-10-CM | POA: Diagnosis not present

## 2019-01-01 NOTE — Progress Notes (Signed)
*  PRELIMINARY RESULTS* Echocardiogram 2D Echocardiogram has been performed.  Elizabeth Cohen 01/01/2019, 12:09 PM

## 2019-01-07 DIAGNOSIS — M791 Myalgia, unspecified site: Secondary | ICD-10-CM | POA: Diagnosis not present

## 2019-01-07 DIAGNOSIS — B0223 Postherpetic polyneuropathy: Secondary | ICD-10-CM | POA: Diagnosis not present

## 2019-01-07 DIAGNOSIS — R768 Other specified abnormal immunological findings in serum: Secondary | ICD-10-CM | POA: Diagnosis not present

## 2019-01-07 DIAGNOSIS — R0602 Shortness of breath: Secondary | ICD-10-CM | POA: Diagnosis not present

## 2019-01-07 DIAGNOSIS — I10 Essential (primary) hypertension: Secondary | ICD-10-CM | POA: Diagnosis not present

## 2019-01-07 DIAGNOSIS — R009 Unspecified abnormalities of heart beat: Secondary | ICD-10-CM | POA: Diagnosis not present

## 2019-01-07 DIAGNOSIS — R600 Localized edema: Secondary | ICD-10-CM | POA: Diagnosis not present

## 2019-01-08 DIAGNOSIS — Z91040 Latex allergy status: Secondary | ICD-10-CM | POA: Diagnosis not present

## 2019-01-08 DIAGNOSIS — Z79899 Other long term (current) drug therapy: Secondary | ICD-10-CM | POA: Diagnosis not present

## 2019-01-08 DIAGNOSIS — R0602 Shortness of breath: Secondary | ICD-10-CM | POA: Diagnosis not present

## 2019-01-08 DIAGNOSIS — F419 Anxiety disorder, unspecified: Secondary | ICD-10-CM | POA: Diagnosis not present

## 2019-01-08 DIAGNOSIS — Z880 Allergy status to penicillin: Secondary | ICD-10-CM | POA: Diagnosis not present

## 2019-01-08 DIAGNOSIS — R7989 Other specified abnormal findings of blood chemistry: Secondary | ICD-10-CM | POA: Diagnosis not present

## 2019-01-08 DIAGNOSIS — Z87891 Personal history of nicotine dependence: Secondary | ICD-10-CM | POA: Diagnosis not present

## 2019-01-08 DIAGNOSIS — Z882 Allergy status to sulfonamides status: Secondary | ICD-10-CM | POA: Diagnosis not present

## 2019-01-08 DIAGNOSIS — Z888 Allergy status to other drugs, medicaments and biological substances status: Secondary | ICD-10-CM | POA: Diagnosis not present

## 2019-01-08 DIAGNOSIS — F329 Major depressive disorder, single episode, unspecified: Secondary | ICD-10-CM | POA: Diagnosis not present

## 2019-01-08 DIAGNOSIS — K219 Gastro-esophageal reflux disease without esophagitis: Secondary | ICD-10-CM | POA: Diagnosis not present

## 2019-01-08 DIAGNOSIS — Z885 Allergy status to narcotic agent status: Secondary | ICD-10-CM | POA: Diagnosis not present

## 2019-01-14 DIAGNOSIS — R0602 Shortness of breath: Secondary | ICD-10-CM | POA: Diagnosis not present

## 2019-01-14 DIAGNOSIS — I10 Essential (primary) hypertension: Secondary | ICD-10-CM | POA: Diagnosis not present

## 2019-01-14 DIAGNOSIS — M791 Myalgia, unspecified site: Secondary | ICD-10-CM | POA: Diagnosis not present

## 2019-01-14 DIAGNOSIS — R768 Other specified abnormal immunological findings in serum: Secondary | ICD-10-CM | POA: Diagnosis not present

## 2019-01-16 ENCOUNTER — Other Ambulatory Visit (HOSPITAL_COMMUNITY): Payer: Self-pay | Admitting: General Practice

## 2019-01-16 ENCOUNTER — Other Ambulatory Visit: Payer: Self-pay | Admitting: General Practice

## 2019-01-16 DIAGNOSIS — R600 Localized edema: Secondary | ICD-10-CM

## 2019-01-16 DIAGNOSIS — R110 Nausea: Secondary | ICD-10-CM

## 2019-01-18 ENCOUNTER — Ambulatory Visit (HOSPITAL_COMMUNITY): Payer: BLUE CROSS/BLUE SHIELD

## 2019-01-22 ENCOUNTER — Other Ambulatory Visit: Payer: Self-pay

## 2019-01-22 ENCOUNTER — Ambulatory Visit (HOSPITAL_COMMUNITY)
Admission: RE | Admit: 2019-01-22 | Discharge: 2019-01-22 | Disposition: A | Discharge status: Home / Self Care | Payer: BC Managed Care – PPO | Source: Ambulatory Visit | Attending: General Practice | Admitting: General Practice

## 2019-01-22 DIAGNOSIS — R110 Nausea: Secondary | ICD-10-CM | POA: Diagnosis not present

## 2019-01-22 DIAGNOSIS — R600 Localized edema: Secondary | ICD-10-CM

## 2019-01-22 DIAGNOSIS — K760 Fatty (change of) liver, not elsewhere classified: Secondary | ICD-10-CM | POA: Diagnosis not present

## 2019-01-22 MED ORDER — IOHEXOL 300 MG/ML  SOLN: 100.0000 mL | Freq: Once | INTRAMUSCULAR | Status: DC | PRN

## 2019-01-22 MED ORDER — IOHEXOL 300 MG/ML  SOLN
100.0000 mL | Freq: Once | INTRAMUSCULAR | Status: AC | PRN
  Administered 2019-01-22: 100 mL via INTRAVENOUS

## 2019-01-29 ENCOUNTER — Other Ambulatory Visit (HOSPITAL_COMMUNITY): Payer: BC Managed Care – PPO

## 2019-01-29 ENCOUNTER — Other Ambulatory Visit (HOSPITAL_COMMUNITY)
Admission: RE | Admit: 2019-01-29 | Discharge: 2019-01-29 | Disposition: A | Discharge status: Home / Self Care | Payer: BC Managed Care – PPO | Source: Ambulatory Visit | Attending: Pulmonary Disease | Admitting: Pulmonary Disease

## 2019-01-29 DIAGNOSIS — Z20828 Contact with and (suspected) exposure to other viral communicable diseases: Secondary | ICD-10-CM | POA: Diagnosis not present

## 2019-01-29 DIAGNOSIS — Z01812 Encounter for preprocedural laboratory examination: Secondary | ICD-10-CM | POA: Insufficient documentation

## 2019-01-29 LAB — SARS CORONAVIRUS 2 (TAT 6-24 HRS): SARS Coronavirus 2: NEGATIVE

## 2019-01-31 ENCOUNTER — Ambulatory Visit (HOSPITAL_COMMUNITY)
Admission: RE | Admit: 2019-01-31 | Discharge: 2019-01-31 | Disposition: A | Discharge status: Home / Self Care | Payer: BC Managed Care – PPO | Source: Ambulatory Visit | Attending: Pulmonary Disease | Admitting: Pulmonary Disease

## 2019-01-31 ENCOUNTER — Other Ambulatory Visit: Payer: Self-pay

## 2019-01-31 DIAGNOSIS — R0602 Shortness of breath: Secondary | ICD-10-CM | POA: Insufficient documentation

## 2019-01-31 MED ORDER — ALBUTEROL SULFATE (2.5 MG/3ML) 0.083% IN NEBU
2.5000 mg | INHALATION_SOLUTION | Freq: Once | RESPIRATORY_TRACT | Status: AC
  Administered 2019-01-31: 2.5 mg via RESPIRATORY_TRACT

## 2019-02-03 LAB — PULMONARY FUNCTION TEST
DL/VA % pred: 110 %
DL/VA: 4.55 ml/min/mmHg/L
DLCO unc % pred: 70 %
DLCO unc: 16.06 ml/min/mmHg
FEF 25-75 Post: 2.42 L/sec
FEF 25-75 Pre: 1.77 L/sec
FEF2575-%Change-Post: 37 %
FEF2575-%Pred-Post: 91 %
FEF2575-%Pred-Pre: 66 %
FEV1-%Change-Post: 6 %
FEV1-%Pred-Post: 72 %
FEV1-%Pred-Pre: 68 %
FEV1-Post: 2.14 L
FEV1-Pre: 2.01 L
FEV1FVC-%Change-Post: 3 %
FEV1FVC-%Pred-Pre: 100 %
FEV6-%Change-Post: 3 %
FEV6-%Pred-Post: 71 %
FEV6-%Pred-Pre: 69 %
FEV6-Post: 2.63 L
FEV6-Pre: 2.54 L
FEV6FVC-%Pred-Post: 103 %
FEV6FVC-%Pred-Pre: 103 %
FVC-%Change-Post: 3 %
FVC-%Pred-Post: 69 %
FVC-%Pred-Pre: 66 %
FVC-Post: 2.63 L
Post FEV1/FVC ratio: 82 %
Post FEV6/FVC ratio: 100 %
Pre FEV1/FVC ratio: 79 %
Pre FEV6/FVC Ratio: 100 %
RV % pred: 96 %
RV: 2.04 L
TLC % pred: 84 %
TLC: 4.71 L

## 2019-02-11 DIAGNOSIS — R768 Other specified abnormal immunological findings in serum: Secondary | ICD-10-CM | POA: Diagnosis not present

## 2019-02-11 DIAGNOSIS — R0602 Shortness of breath: Secondary | ICD-10-CM | POA: Diagnosis not present

## 2019-02-11 DIAGNOSIS — M791 Myalgia, unspecified site: Secondary | ICD-10-CM | POA: Diagnosis not present

## 2019-02-11 DIAGNOSIS — I10 Essential (primary) hypertension: Secondary | ICD-10-CM | POA: Diagnosis not present

## 2019-03-18 DIAGNOSIS — G510 Bell's palsy: Secondary | ICD-10-CM

## 2019-03-18 HISTORY — DX: Bell's palsy: G51.0

## 2019-03-20 ENCOUNTER — Encounter: Payer: Self-pay | Admitting: *Deleted

## 2019-03-21 ENCOUNTER — Encounter: Payer: Self-pay | Admitting: Cardiology

## 2019-03-21 ENCOUNTER — Other Ambulatory Visit: Payer: Self-pay

## 2019-03-21 ENCOUNTER — Ambulatory Visit (INDEPENDENT_AMBULATORY_CARE_PROVIDER_SITE_OTHER): Payer: BC Managed Care – PPO | Admitting: Cardiology

## 2019-03-21 VITALS — BP 120/80 | HR 58 | Ht 67.5 in | Wt 210.0 lb

## 2019-03-21 DIAGNOSIS — I517 Cardiomegaly: Secondary | ICD-10-CM | POA: Diagnosis not present

## 2019-03-21 DIAGNOSIS — R0602 Shortness of breath: Secondary | ICD-10-CM

## 2019-03-21 DIAGNOSIS — I10 Essential (primary) hypertension: Secondary | ICD-10-CM

## 2019-03-21 NOTE — Patient Instructions (Addendum)
Medication Instructions:   Your physician recommends that you continue on your current medications as directed. Please refer to the Current Medication list given to you today.  Labwork:  NONE  Testing/Procedures:  NONE  Follow-Up:  Your physician recommends that you schedule a follow-up appointment in: as needed.   Any Other Special Instructions Will Be Listed Below (If Applicable).  If you need a refill on your cardiac medications before your next appointment, please call your pharmacy. 

## 2019-03-21 NOTE — Progress Notes (Signed)
Cardiology Office Note  Date: 03/21/2019   ID: Elizabeth Cohen, DOB March 24, 1960, MRN [id number]  PCP:  Dara Lords, NP  Consulting Cardiologist:  Rozann Lesches, MD Electrophysiologist:  None   Chief Complaint  Patient presents with  . Review echocardiogram    History of Present Illness: Elizabeth Cohen is a 59 y.o. female referred for cardiology consultation by Ms. DeKoninck NP at the [facility] for review of abnormal echocardiogram.  I reviewed available records.  She states that she and her husband traveled to [facility] back in September 2020, during that trip she began to experience significant leg swelling and ultimately documentation of significantly elevated blood pressure.  She was hospitalized, reports undergoing evaluation that included a chest CTA that was negative for pulmonary embolus, also negative venous Dopplers.  Subsequent to this she was placed on antihypertensives, initially Norvasc, and ultimately transition to the current regimen which includes chlorthalidone and Lopressor.  She was significantly short of breath in the initial weeks after onset of symptoms, but more recently has felt much better and has no further leg swelling.  She is not describing any recurring exertional symptoms at this time.  She was seen by Dr. Luan Pulling for evaluation of shortness of breath, I note that she underwent PFTs in December 2020 that indicated a mild diffusion defect with minimal obstructive airway disease - not clearly explaining her symptoms.  She was also referred for an echocardiogram that overall reassuring, did mention severe left atrial enlargement resulting in the current consultation.  I personally reviewed the echocardiographic images from November 2020.  Left atrial size is actually mildly enlarged overall.  This is consistent with the measured left atrial areas and also left atrial volume (not normalized for BSA).  I discussed this with her  today.  Past Medical History:  Diagnosis Date  . History of skin cancer   . Hypertension   . Positive ANA (antinuclear antibody)     Past Surgical History:  Procedure Laterality Date  . ABDOMINAL HYSTERECTOMY    . APPENDECTOMY  2002  . CARPAL TUNNEL RELEASE  2015  . CHOLECYSTECTOMY  2004  . COLONOSCOPY  10/04/2010   Procedure: COLONOSCOPY;  Surgeon: Daneil Dolin, MD;  [facility];  Service: Endoscopy;  Laterality: N/A;  8:15AM  . EXTERNAL EAR SURGERY Left 01/2018   Skin cancer  . FOOT SURGERY Bilateral 2018  . SKIN CANCER EXCISION Right 09/20/2018   Right arm  . TENDON REPAIR    . ULNAR NERVE REPAIR      Current Outpatient Medications  Medication Sig Dispense Refill  . amoxicillin (AMOXIL) 875 MG tablet Take 875 mg by mouth 2 (two) times daily.    . chlorthalidone (HYGROTON) 25 MG tablet Take 12.5 mg by mouth daily.    . citalopram (CELEXA) 40 MG tablet Take 40 mg by mouth daily.    Marland Kitchen esomeprazole (NEXIUM) 20 MG capsule Take 20 mg by mouth daily at 12 noon.    . fluticasone (FLONASE) 50 MCG/ACT nasal spray Place 1 spray into both nostrils daily.    . metoprolol tartrate (LOPRESSOR) 25 MG tablet Take 25 mg by mouth 2 (two) times daily.    . predniSONE (DELTASONE) 20 MG tablet Take 2-4 tablets by mouth as directed.     No current facility-administered medications for this visit.   Allergies:  Amoxicillin-pot clavulanate, Celecoxib, Codeine, Epinephrine, Latex, Morphine, Naproxen, Povidone-iodine, Tape, and Sulfonamide derivatives   Social History: The patient  reports that she has never smoked. She has never used smokeless tobacco. She reports that she does not drink alcohol or use drugs.   Family History: The patient's family history includes Heart disease in her mother; Hypertension in her father and mother; Stroke in her mother.   ROS:  Please see the history of present illness. Otherwise, complete review of systems is positive for recent onset Bell's palsy.   All other systems are reviewed and negative.   Physical Exam: VS:  BP 120/80   Pulse (!) 58   Ht 5' 7.5" (1.715 m)   Wt 210 lb (95.3 kg)   SpO2 96%   BMI 32.41 kg/m , BMI Body mass index is 32.41 kg/m.  Wt Readings from Last 3 Encounters:  03/21/19 210 lb (95.3 kg)  12/25/14 156 lb (70.8 kg)  10/04/10 145 lb (65.8 kg)    General: Patient appears comfortable at rest. HEENT: Facial features consistent with current Bell's palsy, wearing a mask. Neck: Supple, no elevated JVP or carotid bruits, no thyromegaly. Lungs: Clear to auscultation, nonlabored breathing at rest. Cardiac: Regular rate and rhythm, no S3 or significant systolic murmur, no pericardial rub. Abdomen: Soft, nontender, bowel sounds present. Extremities: No pitting edema, distal pulses 2+. Skin: Warm and dry. Musculoskeletal: No kyphosis. Neuropsychiatric: Alert and oriented x3, affect grossly appropriate.  ECG: Tracing from November 2020 shows normal sinus rhythm with normal intervals.  Recent Labwork:  November 2020: BUN 21, creatinine 0.83, potassium 4.5, rheumatoid factor less than 14,, AST 21, ALT 24, hemoglobin 12.6, platelets 295, ANA screen positive, high-sensitivity CRP 7.6, BNP 112, D-dimer 2.17  Other Studies Reviewed Today:  Echocardiogram 01/01/2019: 1. Left ventricular ejection fraction, by visual estimation, is 60 to  65%. The left ventricle has normal function. There is no left ventricular  hypertrophy.  2. The left ventricle has no regional wall motion abnormalities.  3. Global right ventricle has normal systolic function.The right  ventricular size is normal. No increase in right ventricular wall  thickness.  4. Left atrial size was severely dilated.  5. Right atrial size was normal.  6. The mitral valve is grossly normal. No evidence of mitral valve  regurgitation.  7. The tricuspid valve is grossly normal. Tricuspid valve regurgitation  is not demonstrated.  8. The aortic valve  is tricuspid. Aortic valve regurgitation is not  visualized. No evidence of aortic valve sclerosis or stenosis.  9. The pulmonic valve was grossly normal. Pulmonic valve regurgitation is  not visualized.  10. The inferior vena cava is dilated in size with >50% respiratory  variability, suggesting right atrial pressure of 8 mmHg.   Abdominal and pelvic CT 01/15/2019: FINDINGS: Lower Chest: No acute findings.  Hepatobiliary: No hepatic masses identified. Increased moderate diffuse hepatic steatosis since prior exam. Prior cholecystectomy. No evidence of biliary obstruction.  Pancreas:  No mass or inflammatory changes.  Spleen: Within normal limits in size and appearance.  Adrenals/Urinary Tract: No masses identified. No evidence of hydronephrosis. Unremarkable unopacified urinary bladder.  Stomach/Bowel: No evidence of obstruction, inflammatory process or abnormal fluid collections.  Vascular/Lymphatic: No pathologically enlarged lymph nodes. No abdominal aortic aneurysm.  Reproductive: Prior hysterectomy noted. Adnexal regions are unremarkable in appearance.  Other:  None.  Musculoskeletal:  No suspicious bone lesions identified.  IMPRESSION: 1. No acute findings within the abdomen or pelvis. 2. Moderate hepatic steatosis, increased since 2011.  Assessment and Plan:  1.  Left atrial enlargement by echocardiogram, based on my review of all the images this is  only mild, not severe and supported by the measurements.  This would not be an unusual finding in the setting of hypertension, importantly she does not have any associated valvular abnormalities such as mitral stenosis or regurgitation, and no cardiac arrhythmias.  This finding alone would not explain her presentation from last year or the associated leg swelling/shortness of breath.  2.  Presumed essential hypertension.  She is currently on Lopressor and chlorthalidone with normal blood pressure measurement  today.  We will continue to follow with PCP.  3.  History of dyspnea on exertion and leg swelling.  Echocardiogram is actually reassuring as discussed above.  PFTs were only mildly abnormal and do not explain the situation.  She feels better at this point.  I would suggest that if she has recurring exertional symptomatology, ischemic work-up would be reasonable.  We can see her back as needed.  4.  Positive ANA with pending rheumatology evaluation.  Medication Adjustments/Labs and Tests Ordered: Current medicines are reviewed at length with the patient today.  Concerns regarding medicines are outlined above.   Tests Ordered: No orders of the defined types were placed in this encounter.   Medication Changes: No orders of the defined types were placed in this encounter.   Disposition:  Follow up prn  Signed, Satira Sark, MD, [facility] 03/21/2019 1:56 PM    [facility] [place], [place], [place] [address] Phone: [phone]; Fax: [phone]

## 2019-04-01 ENCOUNTER — Other Ambulatory Visit: Payer: Self-pay

## 2019-04-01 ENCOUNTER — Ambulatory Visit: Payer: BC Managed Care – PPO | Admitting: Neurology

## 2019-04-01 ENCOUNTER — Encounter: Payer: Self-pay | Admitting: Neurology

## 2019-04-01 VITALS — BP 109/70 | HR 60 | Temp 97.2°F | Ht 67.5 in | Wt 214.0 lb

## 2019-04-01 DIAGNOSIS — G510 Bell's palsy: Secondary | ICD-10-CM | POA: Diagnosis not present

## 2019-04-01 DIAGNOSIS — R200 Anesthesia of skin: Secondary | ICD-10-CM

## 2019-04-01 MED ORDER — DULOXETINE HCL 60 MG PO CPEP: 60.0000 mg | ORAL_CAPSULE | Freq: Every day | ORAL | 11 refills | Status: DC

## 2019-04-01 NOTE — Progress Notes (Signed)
PATIENT: Elizabeth Cohen DOB: 08-16-1960  Chief Complaint  Patient presents with  . Bell's Palsy    She is here with her husband, Elizabeth Cohen. Reports left-sided eye drooping and left-sided tongue numbness. She is also having issues with either her left eye being dry or tearing too much. She did get some improvement after taking Valtrex and two rounds of Prednisone.   Marland Kitchen PCP    Leonie Douglas, MD     HISTORICAL  Elizabeth Cohen is a 59 year old female, seen in request by her primary care physician Dr. Leonie Douglas, accompanied by her husband Elizabeth Cohen for evaluation of left facial weakness on April 01, 2019.  I have reviewed and summarized the referring note from the referring physician.  She had past medical history of depression, hypertension,  3 weeks ago at the end of January 2021, she noticed difficulty drinking coffee, coffee just rushed out of her mouth, when she looked herself at the mirror, she noticed asymmetry of her face, difficulty closing her left eye, difficulty moving her left face,  She was seen by her primary care physician, was treated by Valtrex, prednisone, and followed by another round of prednisone because of incomplete recovery  The day prior to symptom onset, she noticed the left mastoid area pain, she was diagnosed with sinus infection, was treated with amoxicillin  Concurrent with her left facial weakness, she also noticed some numbness of her left tongue, which has been persistent since symptom onset,  She now reported 75% improvement of left facial weakness, she can close her left eye better now, still has dry eye, left blurry vision, no rash broke out, but she had constant 7 out of 10 deep achy pain involving left ear, mastoid, preauricular region  Laboratory evaluation in 2020, normal CMP creatinine of 0.72,  REVIEW OF SYSTEMS: Full 14 system review of systems performed and notable only for as above All other review of systems were  negative.  ALLERGIES: Allergies  Allergen Reactions  . Amoxicillin-Pot Clavulanate   . Celecoxib Hives  . Codeine   . Epinephrine Other (See Comments)    dizziness  . Latex     Skin blisters  . Morphine Hives  . Naproxen   . Povidone-Iodine     Betadine cause skin blisters  . Tape     Surgical Bandage adhesive Band-aids  . Sulfonamide Derivatives Rash    HOME MEDICATIONS: Current Outpatient Medications  Medication Sig Dispense Refill  . chlorthalidone (HYGROTON) 25 MG tablet Take 12.5 mg by mouth daily.    . citalopram (CELEXA) 40 MG tablet Take 40 mg by mouth daily.    Marland Kitchen esomeprazole (NEXIUM) 20 MG capsule Take 20 mg by mouth daily at 12 noon.    . fluticasone (FLONASE) 50 MCG/ACT nasal spray Place 1 spray into both nostrils daily.    . metoprolol tartrate (LOPRESSOR) 25 MG tablet Take 25 mg by mouth 2 (two) times daily.    . rizatriptan (MAXALT) 10 MG tablet Take 10 mg by mouth as needed for migraine. May repeat in 2 hours if needed     No current facility-administered medications for this visit.    PAST MEDICAL HISTORY: Past Medical History:  Diagnosis Date  . Acid reflux   . Bell's palsy   . Depression   . History of skin cancer    basal cell, squamous cell  . Hypertension   . Migraine   . Positive ANA (antinuclear antibody)     PAST SURGICAL HISTORY: Past  Surgical History:  Procedure Laterality Date  . ABDOMINAL HYSTERECTOMY    . APPENDECTOMY  2002  . CARPAL TUNNEL RELEASE  2015  . CHOLECYSTECTOMY  2004  . COLONOSCOPY  10/04/2010   Procedure: COLONOSCOPY;  Surgeon: Daneil Dolin, MD;  [facility];  Service: Endoscopy;  Laterality: N/A;  8:15AM  . EXTERNAL EAR SURGERY Left 01/2018   Skin cancer  . FOOT SURGERY Bilateral 2018  . SKIN CANCER EXCISION Right 09/20/2018   Right arm  . TENDON REPAIR    . ULNAR NERVE REPAIR      FAMILY HISTORY: Family History  Problem Relation Age of Onset  . Stroke Mother   . Hypertension Mother   .  Heart disease Mother   . Atrial fibrillation Mother   . Hypertension Father     SOCIAL HISTORY: Social History   Socioeconomic History  . Marital status: Divorced    Spouse name: Not on file  . Number of children: 1  . Years of education: some college  . Highest education level: Not on file  Occupational History  . Not on file  Tobacco Use  . Smoking status: Former Smoker    Quit date: 1998    Years since quitting: 23.1  . Smokeless tobacco: Never Used  Substance and Sexual Activity  . Alcohol use: Never    Alcohol/week: 0.0 standard drinks  . Drug use: Never  . Sexual activity: Not on file  Other Topics Concern  . Not on file  Social History Narrative   Lives at home with her husband.   Right-handed.   No caffeine use.   Social Determinants of Health   Financial Resource Strain:   . Difficulty of Paying Living Expenses: Not on file  Food Insecurity:   . Worried About Charity fundraiser in the Last Year: Not on file  . Ran Out of Food in the Last Year: Not on file  Transportation Needs:   . Lack of Transportation (Medical): Not on file  . Lack of Transportation (Non-Medical): Not on file  Physical Activity:   . Days of Exercise per Week: Not on file  . Minutes of Exercise per Session: Not on file  Stress:   . Feeling of Stress : Not on file  Social Connections:   . Frequency of Communication with Friends and Family: Not on file  . Frequency of Social Gatherings with Friends and Family: Not on file  . Attends Religious Services: Not on file  . Active Member of Clubs or Organizations: Not on file  . Attends Archivist Meetings: Not on file  . Marital Status: Not on file  Intimate Partner Violence:   . Fear of Current or Ex-Partner: Not on file  . Emotionally Abused: Not on file  . Physically Abused: Not on file  . Sexually Abused: Not on file     PHYSICAL EXAM   Vitals:   04/01/19 1552  BP: 109/70  Pulse: 60  Temp: (!) 97.2 F (36.2 C)   Weight: 214 lb (97.1 kg)  Height: 5' 7.5" (1.715 m)    Not recorded      Body mass index is 33.02 kg/m.  PHYSICAL EXAMNIATION:  Gen: NAD, conversant, well nourised, well groomed                     Cardiovascular: Regular rate rhythm, no peripheral edema, warm, nontender. Eyes: Conjunctivae clear without exudates or hemorrhage Neck: Supple, no carotid bruits. Pulmonary: Clear  to auscultation bilaterally   NEUROLOGICAL EXAM:  MENTAL STATUS: Speech:    Speech is normal; fluent and spontaneous with normal comprehension.  Cognition:     Orientation to time, place and person     Normal recent and remote memory     Normal Attention span and concentration     Normal Language, naming, repeating,spontaneous speech     Fund of knowledge   CRANIAL NERVES: CN II: Visual fields are full to confrontation. Pupils are round equal and briskly reactive to light. CN III, IV, VI: extraocular movement are normal. No ptosis. CN V: Facial sensation is intact to light touch, bilateral corneal reflexes were present and symmetric. CN VII: She has mild left eye closure weakness, mild left cheek puff weakness, CN VIII: Hearing is normal to causal conversation. CN IX, X: Phonation is normal. CN XI: Head turning and shoulder shrug are intact  MOTOR: There is no pronator drift of out-stretched arms. Muscle bulk and tone are normal. Muscle strength is normal.  REFLEXES: Reflexes are 2+ and symmetric at the biceps, triceps, knees, and ankles. Plantar responses are flexor.  SENSORY: Intact to light touch, pinprick and vibratory sensation are intact in fingers and toes.  COORDINATION: There is no trunk or limb dysmetria noted.  GAIT/STANCE: Posture is normal. Gait is steady with normal steps, base, arm swing, and turning. Heel and toe walking are normal. Tandem gait is normal.  Romberg is absent.   DIAGNOSTIC DATA (LABS, IMAGING, TESTING) - I reviewed patient records, labs, notes, testing and  imaging myself where available.   ASSESSMENT AND PLAN  BRESEIS DELMAN is a 59 y.o. female   Left facial weakness,  Most consistent with left Bell's palsy, involving left upper and lower face, she did reported moderate improvement,  But the atypical feature is her persistent left arm numbness, left mastoid area pain  We will proceed with MRI of the brain with without contrast to rule out left brainstem pathology  Cymbalta 60 mg daily   Marcial Pacas, M.D. Ph.D.  [facility] [address], [place], [place] [address] Ph: [phone] Fax: [phone]  CC: Leonie Douglas, MD

## 2019-04-11 ENCOUNTER — Telehealth: Payer: Self-pay | Admitting: Neurology

## 2019-04-11 NOTE — Telephone Encounter (Signed)
spoke to the patient she believes she no longer needs to have this exam done   Elizabeth Cohen: [place] Ref # [id number]

## 2019-05-28 ENCOUNTER — Encounter: Payer: Self-pay | Admitting: Family Medicine

## 2019-05-28 ENCOUNTER — Telehealth: Payer: Self-pay | Admitting: Cardiology

## 2019-05-28 ENCOUNTER — Ambulatory Visit (INDEPENDENT_AMBULATORY_CARE_PROVIDER_SITE_OTHER): Payer: BC Managed Care – PPO | Admitting: Family Medicine

## 2019-05-28 ENCOUNTER — Other Ambulatory Visit: Payer: Self-pay

## 2019-05-28 VITALS — BP 118/82 | HR 78 | Ht 67.5 in | Wt 211.0 lb

## 2019-05-28 DIAGNOSIS — R0602 Shortness of breath: Secondary | ICD-10-CM

## 2019-05-28 DIAGNOSIS — R002 Palpitations: Secondary | ICD-10-CM

## 2019-05-28 DIAGNOSIS — I517 Cardiomegaly: Secondary | ICD-10-CM

## 2019-05-28 DIAGNOSIS — I10 Essential (primary) hypertension: Secondary | ICD-10-CM

## 2019-05-28 NOTE — Telephone Encounter (Signed)
  Patient Consent for Virtual Visit         Elizabeth Cohen has provided verbal consent on 05/28/2019 for a virtual visit (video or telephone).   CONSENT FOR VIRTUAL VISIT FOR:  Elizabeth Cohen  By participating in this virtual visit I agree to the following:  I hereby voluntarily request, consent and authorize [facility] and its employed or contracted physicians, physician assistants, nurse practitioners or other licensed health care professionals (the Practitioner), to provide me with telemedicine health care services (the "Services") as deemed necessary by the treating Practitioner. I acknowledge and consent to receive the Services by the Practitioner via telemedicine. I understand that the telemedicine visit will involve communicating with the Practitioner through live audiovisual communication technology and the disclosure of certain medical information by electronic transmission. I acknowledge that I have been given the opportunity to request an in-person assessment or other available alternative prior to the telemedicine visit and am voluntarily participating in the telemedicine visit.  I understand that I have the right to withhold or withdraw my consent to the use of telemedicine in the course of my care at any time, without affecting my right to future care or treatment, and that the Practitioner or I may terminate the telemedicine visit at any time. I understand that I have the right to inspect all information obtained and/or recorded in the course of the telemedicine visit and may receive copies of available information for a reasonable fee.  I understand that some of the potential risks of receiving the Services via telemedicine include:  Marland Kitchen Delay or interruption in medical evaluation due to technological equipment failure or disruption; . Information transmitted may not be sufficient (e.g. poor resolution of images) to allow for appropriate medical decision making by the  Practitioner; and/or  . In rare instances, security protocols could fail, causing a breach of personal health information.  Furthermore, I acknowledge that it is my responsibility to provide information about my medical history, conditions and care that is complete and accurate to the best of my ability. I acknowledge that Practitioner's advice, recommendations, and/or decision may be based on factors not within their control, such as incomplete or inaccurate data provided by me or distortions of diagnostic images or specimens that may result from electronic transmissions. I understand that the practice of medicine is not an exact science and that Practitioner makes no warranties or guarantees regarding treatment outcomes. I acknowledge that a copy of this consent can be made available to me via my patient portal ([facility]), or I can request a printed copy by calling the office of [facility].    I understand that my insurance will be billed for this visit.   I have read or had this consent read to me. . I understand the contents of this consent, which adequately explains the benefits and risks of the Services being provided via telemedicine.  . I have been provided ample opportunity to ask questions regarding this consent and the Services and have had my questions answered to my satisfaction. . I give my informed consent for the services to be provided through the use of telemedicine in my medical care

## 2019-05-28 NOTE — Patient Instructions (Addendum)
Your physician recommends that you schedule a follow-up appointment in: [place]   Your physician recommends that you continue on your current medications as directed. Please refer to the Current Medication list given to you today.  ZIO XT MONITOR FOR 2 WEEKS  Thank you for choosing [facility]!!

## 2019-05-28 NOTE — Progress Notes (Addendum)
Cardiology Office Note  Date: 05/28/2019   ID: Elizabeth Cohen, DOB 1960/10/28, MRN [id number]  PCP:  Leonie Douglas, MD  Cardiologist:  Rozann Lesches, MD Electrophysiologist:  None   Chief Complaint: Follow-up for shortness of breath, abnormal EKG, recurrent palpitations / racing heart  History of Present Illness: Elizabeth Cohen is a 59 y.o. female with a history of shortness of breath and abnormal EKG referred by nurse practitioner at [facility] for abnormal EKG.  She had presented to provider for significant with leg swelling and documented elevated blood pressures.  She was hospitalized and had a chest CTA that was negative for PE.  Negative venous Dopplers.  Placed on antihypertensives including Norvasc initially with transition to chlorthalidone and Lopressor.  Remained significantly short of breath in the initial weeks after onset of symptoms but had more recently felt much better with no further leg swelling.  She had seen Dr. Luan Pulling for evaluation of shortness of breath and underwent PFTs in December 2020 which showed a mild diffusion defect with minimal obstructive disease.  Last saw Dr. Domenic Polite on 03/21/2019 where he commented on the echocardiogram as being reassuring.  He stated the left atrial enlargement was not severe, only mild.  She did not have any associated valvular abnormalities such as mitral stenosis or regurgitation and no evidence of cardiac arrhythmias on echo.  Patient states since around [place] she is having increasing shortness of breath with associated rapid heart rates/palpitations.  She states the palpitations/racing heart become significantly fast as high as 143 on exertional effort.  She also states she can have palpitations by just turning over in bed sometimes.  She states her mother has a history of atrial fibrillation and an aunt also recently was diagnosed with atrial fibrillation.  She denies any anginal symptoms.  She does states  she feels slightly dizzy when these rapid heart rates occur but no presyncopal or syncopal episodes.  States her leg swelling is better.  She is currently taking chlorthalidone.  She makes the statement she feels give out like she may have been running for a long time.  Past Medical History:  Diagnosis Date  . Acid reflux   . Bell's palsy   . Depression   . History of skin cancer    basal cell, squamous cell  . Hypertension   . Migraine   . Positive ANA (antinuclear antibody)     Past Surgical History:  Procedure Laterality Date  . ABDOMINAL HYSTERECTOMY    . APPENDECTOMY  2002  . CARPAL TUNNEL RELEASE  2015  . CHOLECYSTECTOMY  2004  . COLONOSCOPY  10/04/2010   Procedure: COLONOSCOPY;  Surgeon: Daneil Dolin, MD;  [facility];  Service: Endoscopy;  Laterality: N/A;  8:15AM  . EXTERNAL EAR SURGERY Left 01/2018   Skin cancer  . FOOT SURGERY Bilateral 2018  . SKIN CANCER EXCISION Right 09/20/2018   Right arm  . TENDON REPAIR    . ULNAR NERVE REPAIR      Current Outpatient Medications  Medication Sig Dispense Refill  . chlorthalidone (HYGROTON) 25 MG tablet Take 25 mg by mouth daily.     . cholecalciferol (VITAMIN D3) 25 MCG (1000 UNIT) tablet Take 1,000 Units by mouth daily.    . DULoxetine (CYMBALTA) 60 MG capsule Take 1 capsule (60 mg total) by mouth daily. (Patient taking differently: Take 60 mg by mouth 2 (two) times daily. ) 30 capsule 11  . esomeprazole (NEXIUM) 20 MG capsule  Take 20 mg by mouth daily at 12 noon.    . fexofenadine (ALLEGRA) 180 MG tablet Take 180 mg by mouth daily.    . fluticasone (FLONASE) 50 MCG/ACT nasal spray Place 1 spray into both nostrils daily.    . metoprolol tartrate (LOPRESSOR) 25 MG tablet Take 25 mg by mouth 2 (two) times daily.    . rizatriptan (MAXALT) 10 MG tablet Take 10 mg by mouth as needed for migraine. May repeat in 2 hours if needed     No current facility-administered medications for this visit.   Allergies:   Amoxicillin-pot clavulanate, Celecoxib, Codeine, Epinephrine, Latex, Morphine, Naproxen, Povidone-iodine, Tape, and Sulfonamide derivatives   Social History: The patient  reports that she quit smoking about 23 years ago. She has never used smokeless tobacco. She reports that she does not drink alcohol or use drugs.   Family History: The patient's family history includes Atrial fibrillation in her mother; Heart disease in her mother; Hypertension in her father and mother; Stroke in her mother.   ROS:  Please see the history of present illness. Otherwise, complete review of systems is positive for none.  All other systems are reviewed and negative.   Physical Exam: VS:  BP 118/82   Pulse 78   Ht 5' 7.5" (1.715 m)   Wt 211 lb (95.7 kg)   SpO2 98%   BMI 32.56 kg/m , BMI Body mass index is 32.56 kg/m.  Wt Readings from Last 3 Encounters:  05/28/19 211 lb (95.7 kg)  04/01/19 214 lb (97.1 kg)  03/21/19 210 lb (95.3 kg)    General: Patient appears comfortable at rest. Neck: Supple, no elevated JVP or carotid bruits, no thyromegaly. Lungs: Clear to auscultation, nonlabored breathing at rest. Cardiac: Regular rate and rhythm, no S3 or significant systolic murmur, no pericardial rub. Extremities: No pitting edema, distal pulses 2+. Skin: Warm and dry. Musculoskeletal: No kyphosis. Neuropsychiatric: Alert and oriented x3, affect grossly appropriate.  ECG:  An ECG dated 05/28/2019 was personally reviewed today and demonstrated:  Normal sinus rhythm rate of 82 left axis deviation, LVH with repolarization abnormality, cannot rule out septal infarct age undetermined  Recent Labwork: No results found for requested labs within last 8760 hours.  No results found for: CHOL, TRIG, HDL, CHOLHDL, VLDL, LDLCALC, LDLDIRECT  Other Studies Reviewed Today:  Echocardiogram 01/01/2019: 1. Left ventricular ejection fraction, by visual estimation, is 60 to  65%. The left ventricle has normal function.  There is no left ventricular  hypertrophy.  2. The left ventricle has no regional wall motion abnormalities.  3. Global right ventricle has normal systolic function.The right  ventricular size is normal. No increase in right ventricular wall  thickness.  4. Left atrial size was severely dilated.  5. Right atrial size was normal.  6. The mitral valve is grossly normal. No evidence of mitral valve  regurgitation.  7. The tricuspid valve is grossly normal. Tricuspid valve regurgitation  is not demonstrated.  8. The aortic valve is tricuspid. Aortic valve regurgitation is not  visualized. No evidence of aortic valve sclerosis or stenosis.  9. The pulmonic valve was grossly normal. Pulmonic valve regurgitation is  not visualized.  10. The inferior vena cava is dilated in size with >50% respiratory  variability, suggesting right atrial pressure of 8 mmHg.   Abdominal and pelvic CT 01/15/2019: FINDINGS: Lower Chest: No acute findings.  Hepatobiliary: No hepatic masses identified. Increased moderate diffuse hepatic steatosis since prior exam. Prior cholecystectomy. No evidence of biliary  obstruction.  Pancreas: No mass or inflammatory changes.  Spleen: Within normal limits in size and appearance.  Adrenals/Urinary Tract: No masses identified. No evidence of hydronephrosis. Unremarkable unopacified urinary bladder.  Stomach/Bowel: No evidence of obstruction, inflammatory process or abnormal fluid collections.  Vascular/Lymphatic: No pathologically enlarged lymph nodes. No abdominal aortic aneurysm.  Reproductive: Prior hysterectomy noted. Adnexal regions are unremarkable in appearance.  Other: None.  Musculoskeletal: No suspicious bone lesions identified.  IMPRESSION: 1. No acute findings within the abdomen or pelvis. 2. Moderate hepatic steatosis, increased since 2011.  Assessment and Plan:  1. Palpitations   2. Shortness of breath   3. Essential  hypertension   4. Mild left atrial enlargement    1. Palpitations Patient states since Easter she has been having increasing palpitations/rapid heart rates.  States she has been checking the rates on an app on her cell phone.  She states with minimal exertion she has significant heart racing/palpitations associated with shortness of breath, and dizziness but no syncopal or orthostatic symptoms.  Please get ZIO patch monitor for 2 weeks to assess for tachyarrhythmias.  EKG shows normal sinus rhythm rate of 82 today.  2. Shortness of breath Patient states shortness of breath appears to be associated with increasing heart rate which has worsened over the last couple of weeks since Easter.  States her swelling is better in her legs but shortness of breath comes on with any exertional effort.  No evidence on echo to explain dyspnea on exertion.  If there is no explanation from an arrhythmia standpoint and shortness of breath continues she will need a cardiac work-up in the form of a nuclear treadmill Myoview stress.  3. Essential hypertension She is normotensive today with a blood pressure of 118/82.  Pulse is 78.  Continue chlorthalidone 25 mg daily, Lopressor 25 mg p.o. twice daily.   Medication Adjustments/Labs and Tests Ordered: Current medicines are reviewed at length with the patient today.  Concerns regarding medicines are outlined above.   Disposition: Follow-up with Dr. Domenic Polite in 4 weeks  Signed, Levell July, NP 05/28/2019 4:39 PM    [facility] [place], [place], [place] [address] Phone: [phone]; Fax: [phone]

## 2019-05-29 ENCOUNTER — Ambulatory Visit: Payer: BC Managed Care – PPO

## 2019-06-03 ENCOUNTER — Ambulatory Visit (INDEPENDENT_AMBULATORY_CARE_PROVIDER_SITE_OTHER): Payer: BC Managed Care – PPO

## 2019-06-03 DIAGNOSIS — R002 Palpitations: Secondary | ICD-10-CM

## 2019-06-14 ENCOUNTER — Other Ambulatory Visit (HOSPITAL_COMMUNITY): Payer: Self-pay | Admitting: Neurosurgery

## 2019-06-14 ENCOUNTER — Other Ambulatory Visit: Payer: Self-pay | Admitting: Neurosurgery

## 2019-06-14 DIAGNOSIS — M5441 Lumbago with sciatica, right side: Secondary | ICD-10-CM

## 2019-06-24 ENCOUNTER — Encounter: Payer: Self-pay | Admitting: Cardiology

## 2019-06-24 NOTE — Progress Notes (Signed)
Virtual Visit via Telephone Note   This visit type was conducted due to national recommendations for restrictions regarding the COVID-19 Pandemic (e.g. social distancing) in an effort to limit this patient's exposure and mitigate transmission in our community.  Due to her co-morbid illnesses, this patient is at least at moderate risk for complications without adequate follow up.  This format is felt to be most appropriate for this patient at this time.  The patient did not have access to video technology/had technical difficulties with video requiring transitioning to audio format only (telephone).  All issues noted in this document were discussed and addressed.  No physical exam could be performed with this format.  Please refer to the patient's chart for her  consent to telehealth for [facility].   The patient was identified using 2 identifiers.  Date:  06/25/2019   ID:  Elizabeth Cohen, DOB 05-19-1960, MRN [id number]  Patient Location: Home Provider Location: Office  PCP:  Leonie Douglas, MD  Cardiologist:  Rozann Lesches, MD Electrophysiologist:  None   Evaluation Performed:  Follow-Up Visit  Chief Complaint:  Cardiac follow-up  History of Present Illness:    Elizabeth Cohen is a 59 y.o. female last seen in April by Mr. Leonides Sake NP.  She was seen at that time for evaluation of palpitations and a Zio patch was ordered.  Results are noted below and overall reassuring, no specific arrhythmias noted (no atrial fibrillation), only a brief 4 beat burst of SVT.  She states that she had symptoms throughout wearing the monitor, and mainly seems to report an exertional sense of breathlessness and increased heart rate, no chest pain.  She has had no syncope.  I reviewed her medications which are stable and outlined below.  We discussed proceeding with an exercise Myoview to exclude underlying ischemic heart disease as precipitant for symptoms.  LVEF was normal by echocardiogram in  November 2020, no major valvular abnormalities noted.   Past Medical History:  Diagnosis Date  . Acid reflux   . Bell's palsy   . Depression   . History of skin cancer    Basal cell, squamous cell  . Hypertension   . Migraine   . Positive ANA (antinuclear antibody)    Past Surgical History:  Procedure Laterality Date  . ABDOMINAL HYSTERECTOMY    . APPENDECTOMY  2002  . CARPAL TUNNEL RELEASE  2015  . CHOLECYSTECTOMY  2004  . COLONOSCOPY  10/04/2010   Procedure: COLONOSCOPY;  Surgeon: Daneil Dolin, MD;  [facility];  Service: Endoscopy;  Laterality: N/A;  8:15AM  . EXTERNAL EAR SURGERY Left 01/2018   Skin cancer  . FOOT SURGERY Bilateral 2018  . SKIN CANCER EXCISION Right 09/20/2018   Right arm  . TENDON REPAIR    . ULNAR NERVE REPAIR       Current Meds  Medication Sig  . chlorthalidone (HYGROTON) 25 MG tablet Take 25 mg by mouth daily.   . cholecalciferol (VITAMIN D3) 25 MCG (1000 UNIT) tablet Take 1,000 Units by mouth daily.  . DULoxetine (CYMBALTA) 30 MG capsule Take 30 mg by mouth 2 (two) times daily.  Marland Kitchen esomeprazole (NEXIUM) 20 MG capsule Take 20 mg by mouth daily at 12 noon.  . fexofenadine (ALLEGRA) 180 MG tablet Take 180 mg by mouth daily.  . fluticasone (FLONASE) 50 MCG/ACT nasal spray Place 1 spray into both nostrils daily.  Marland Kitchen HYDROcodone-acetaminophen (NORCO) 10-325 MG tablet Take 1-2 tablets by mouth 2 (two) times daily  as needed.  . metoprolol tartrate (LOPRESSOR) 25 MG tablet Take 25 mg by mouth 2 (two) times daily.  . rizatriptan (MAXALT) 10 MG tablet Take 10 mg by mouth as needed for migraine. May repeat in 2 hours if needed  . [DISCONTINUED] DULoxetine (CYMBALTA) 60 MG capsule Take 1 capsule (60 mg total) by mouth daily. (Patient taking differently: Take 60 mg by mouth 2 (two) times daily. )     Allergies:   Amoxicillin-pot clavulanate, Celecoxib, Codeine, Epinephrine, Latex, Morphine, Naproxen, Povidone-iodine, Tape, and Sulfonamide  derivatives   ROS:   No syncope.  Prior CV studies:   The following studies were reviewed today:  Echocardiogram 01/01/2019: 1. Left ventricular ejection fraction, by visual estimation, is 60 to  65%. The left ventricle has normal function. There is no left ventricular  hypertrophy.  2. The left ventricle has no regional wall motion abnormalities.  3. Global right ventricle has normal systolic function.The right  ventricular size is normal. No increase in right ventricular wall  thickness.  4. Left atrial size was severely dilated.  5. Right atrial size was normal.  6. The mitral valve is grossly normal. No evidence of mitral valve  regurgitation.  7. The tricuspid valve is grossly normal. Tricuspid valve regurgitation  is not demonstrated.  8. The aortic valve is tricuspid. Aortic valve regurgitation is not  visualized. No evidence of aortic valve sclerosis or stenosis.  9. The pulmonic valve was grossly normal. Pulmonic valve regurgitation is  not visualized.  10. The inferior vena cava is dilated in size with >50% respiratory  variability, suggesting right atrial pressure of 8 mmHg.   Cardiac monitor April 2021: ZIO XT reviewed.  10 days 7 hours analyzed.  Predominant rhythm is sinus with heart rate ranging from 42 bpm up to 141 bpm and average heart rate 69 bpm.  Rare PACs and PVCs were noted representing less than 1% of total beats.  Very brief burst of SVT lasting 4 beats was noted, no sustained arrhythmias however and no pauses.  Labs/Other Tests and Data Reviewed:    EKG:  An ECG dated 05/28/2019 was personally reviewed today and demonstrated:  Sinus rhythm with leftward axis and increased voltage.  Recent Labs:  November 2020: BUN 21, creatinine 0.83, potassium 4.5, rheumatoid factor less than 14,, AST 21, ALT 24, hemoglobin 12.6, platelets 295, ANA screen positive, high-sensitivity CRP 7.6, BNP 112, D-dimer 2.17  Wt Readings from Last 3 Encounters:  06/25/19  209 lb 12.8 oz (95.2 kg)  05/28/19 211 lb (95.7 kg)  04/01/19 214 lb (97.1 kg)     Objective:    Vital Signs:  BP 122/74   Pulse (!) 59   Ht 5' 7.5" (1.715 m)   Wt 209 lb 12.8 oz (95.2 kg)   BMI 32.37 kg/m    Patient spoke in full sentences, not short of breath. No audible wheezing or coughing.  ASSESSMENT & PLAN:    1.  Dyspnea on exertion and sense of elevated heart rate and fatigue.  LVEF normal by echocardiogram and no major valvular abnormalities.  Recent cardiac monitor did not demonstrate any specific arrhythmias to correlate with symptoms as discussed above.  We will plan on exercise Myoview for ischemic evaluation.  Hold Lopressor for testing.  2.  Essential hypertension, currently on Lopressor and chlorthalidone.  Blood pressure is adequately controlled today.   Time:   Today, I have spent 5 minutes with the patient with telehealth technology discussing the above problems.  Medication Adjustments/Labs and Tests Ordered: Current medicines are reviewed at length with the patient today.  Concerns regarding medicines are outlined above.   Tests Ordered: Orders Placed This Encounter  Procedures  . NM Myocar Multi W/Spect W/Wall Motion / EF    Medication Changes: No orders of the defined types were placed in this encounter.   Follow Up:  Review test results and determine disposition.   Signed, Rozann Lesches, MD  06/25/2019 8:35 AM    [facility]

## 2019-06-25 ENCOUNTER — Encounter: Payer: Self-pay | Admitting: Cardiology

## 2019-06-25 ENCOUNTER — Other Ambulatory Visit: Payer: Self-pay | Admitting: *Deleted

## 2019-06-25 ENCOUNTER — Ambulatory Visit (INDEPENDENT_AMBULATORY_CARE_PROVIDER_SITE_OTHER): Payer: BC Managed Care – PPO | Admitting: Cardiology

## 2019-06-25 ENCOUNTER — Telehealth: Payer: Self-pay | Admitting: Cardiology

## 2019-06-25 ENCOUNTER — Other Ambulatory Visit: Payer: Self-pay

## 2019-06-25 ENCOUNTER — Encounter: Payer: Self-pay | Admitting: *Deleted

## 2019-06-25 VITALS — BP 122/74 | HR 59 | Ht 67.5 in | Wt 209.8 lb

## 2019-06-25 DIAGNOSIS — I10 Essential (primary) hypertension: Secondary | ICD-10-CM

## 2019-06-25 DIAGNOSIS — R002 Palpitations: Secondary | ICD-10-CM

## 2019-06-25 DIAGNOSIS — R0600 Dyspnea, unspecified: Secondary | ICD-10-CM | POA: Diagnosis not present

## 2019-06-25 DIAGNOSIS — R0609 Other forms of dyspnea: Secondary | ICD-10-CM

## 2019-06-25 NOTE — Telephone Encounter (Signed)
Pre-cert Verification for the following procedure    Exercise Stress   DATE: 07/10/2019  LOCATION: [facility]

## 2019-06-25 NOTE — Patient Instructions (Addendum)
Medication Instructions:   Your physician recommends that you continue on your current medications as directed. Please refer to the Current Medication list given to you today.  Labwork:  You will need a covid test 2-3 days before your stress test. We will notify you about this appointment.  Testing/Procedures: Your physician has requested that you have en exercise stress myoview. For further information please visit [email or web address]. Please follow instruction sheet, as given.  Follow-Up:  Your physician recommends that you schedule a follow-up appointment in: pending  Any Other Special Instructions Will Be Listed Below (If Applicable).  If you need a refill on your cardiac medications before your next appointment, please call your pharmacy.

## 2019-07-02 ENCOUNTER — Telehealth: Payer: Self-pay | Admitting: Cardiology

## 2019-07-02 DIAGNOSIS — R0609 Other forms of dyspnea: Secondary | ICD-10-CM

## 2019-07-02 DIAGNOSIS — R0600 Dyspnea, unspecified: Secondary | ICD-10-CM

## 2019-07-02 NOTE — Telephone Encounter (Signed)
Patient informed that test changed to [facility] and that she did not have to hold her metoprolol. Advised that covid test appointment has been canceled.

## 2019-07-02 NOTE — Telephone Encounter (Signed)
Patient called and stated that she is unable to do a exercise Stress Test due to her back

## 2019-07-02 NOTE — Telephone Encounter (Signed)
Test can be switched to a [employer] if she agrees.

## 2019-07-05 ENCOUNTER — Ambulatory Visit (HOSPITAL_COMMUNITY)
Admission: RE | Admit: 2019-07-05 | Discharge: 2019-07-05 | Disposition: A | Discharge status: Home / Self Care | Payer: BC Managed Care – PPO | Source: Ambulatory Visit | Attending: Neurosurgery | Admitting: Neurosurgery

## 2019-07-05 ENCOUNTER — Other Ambulatory Visit: Payer: Self-pay

## 2019-07-05 DIAGNOSIS — M4726 Other spondylosis with radiculopathy, lumbar region: Secondary | ICD-10-CM | POA: Insufficient documentation

## 2019-07-05 DIAGNOSIS — M5441 Lumbago with sciatica, right side: Secondary | ICD-10-CM | POA: Diagnosis not present

## 2019-07-05 DIAGNOSIS — M48061 Spinal stenosis, lumbar region without neurogenic claudication: Secondary | ICD-10-CM | POA: Insufficient documentation

## 2019-07-05 MED ORDER — ONDANSETRON HCL 4 MG/2ML IJ SOLN: 4.0000 mg | Freq: Four times a day (QID) | INTRAMUSCULAR | Status: DC | PRN

## 2019-07-05 MED ORDER — TRAMADOL HCL 50 MG PO TABS
50.0000 mg | ORAL_TABLET | Freq: Four times a day (QID) | ORAL | Status: DC | PRN
  Filled 2019-07-05: qty 2

## 2019-07-05 MED ORDER — IOHEXOL 180 MG/ML  SOLN
20.0000 mL | Freq: Once | INTRAMUSCULAR | Status: AC | PRN
  Administered 2019-07-05: 20 mL via INTRATHECAL

## 2019-07-05 MED ORDER — TRAMADOL HCL 50 MG PO TABS
ORAL_TABLET | ORAL | Status: AC
  Administered 2019-07-05: 50 mg via ORAL
  Filled 2019-07-05: qty 1

## 2019-07-05 MED ORDER — DIAZEPAM 5 MG PO TABS
10.0000 mg | ORAL_TABLET | Freq: Once | ORAL | Status: AC
  Administered 2019-07-05: 10 mg via ORAL
  Filled 2019-07-05 (×2): qty 2

## 2019-07-05 MED ORDER — LIDOCAINE HCL (PF) 1 % IJ SOLN
5.0000 mL | Freq: Once | INTRAMUSCULAR | Status: AC
  Administered 2019-07-05: 5 mL

## 2019-07-05 NOTE — Op Note (Signed)
07/05/2019 Lumbar Myelogram  PATIENT:  Elizabeth Cohen is a 59 y.o. female  PRE-OPERATIVE DIAGNOSIS:  Lumbar spondylosis with radiculopathy  POST-OPERATIVE DIAGNOSIS:  same  PROCEDURE:  Lumbar Myelogram  SURGEON:  Abriella Filkins  ANESTHESIA:   local LOCAL MEDICATIONS USED:  LIDOCAINE  Procedure Note: Elizabeth Cohen is a 59 y.o. female Was taken to the fluoroscopy suite and  positioned prone on the fluoroscopy table. Her back was prepared and draped in a sterile manner. I infiltrated 10 cc into the lumbar region. I then introduced a spinal needle into the thecal sac at the L3/4 interlaminar space. I infiltrated 20cc of Isovue 180 into the thecal sac. Fluoroscopy showed the needle and contrast in the thecal sac. Elizabeth Cohen tolerated the procedure well. she Will be taken to CT for evaluation.     PATIENT DISPOSITION:  Short Stay

## 2019-07-05 NOTE — Discharge Instructions (Signed)

## 2019-07-08 ENCOUNTER — Encounter (HOSPITAL_COMMUNITY): Payer: BC Managed Care – PPO

## 2019-07-08 ENCOUNTER — Other Ambulatory Visit (HOSPITAL_COMMUNITY): Payer: BC Managed Care – PPO

## 2019-07-10 ENCOUNTER — Encounter (HOSPITAL_COMMUNITY)
Admission: RE | Admit: 2019-07-10 | Discharge: 2019-07-10 | Disposition: A | Discharge status: Home / Self Care | Payer: BC Managed Care – PPO | Source: Ambulatory Visit | Attending: Cardiology | Admitting: Cardiology

## 2019-07-10 ENCOUNTER — Encounter (HOSPITAL_BASED_OUTPATIENT_CLINIC_OR_DEPARTMENT_OTHER)
Admission: RE | Admit: 2019-07-10 | Discharge: 2019-07-10 | Disposition: A | Discharge status: Home / Self Care | Payer: BC Managed Care – PPO | Source: Ambulatory Visit | Attending: Cardiology | Admitting: Cardiology

## 2019-07-10 ENCOUNTER — Encounter (HOSPITAL_COMMUNITY): Payer: Self-pay

## 2019-07-10 ENCOUNTER — Other Ambulatory Visit: Payer: Self-pay

## 2019-07-10 ENCOUNTER — Encounter (HOSPITAL_COMMUNITY): Payer: BC Managed Care – PPO

## 2019-07-10 DIAGNOSIS — R0600 Dyspnea, unspecified: Secondary | ICD-10-CM

## 2019-07-10 DIAGNOSIS — R0609 Other forms of dyspnea: Secondary | ICD-10-CM

## 2019-07-10 HISTORY — DX: Malignant (primary) neoplasm, unspecified: C80.1

## 2019-07-10 LAB — NM MYOCAR MULTI W/SPECT W/WALL MOTION / EF
LV dias vol: 77 mL (ref 46–106)
LV sys vol: 28 mL
Peak HR: 98 {beats}/min
RATE: 0.41
Rest HR: 58 {beats}/min
SDS: 0
SRS: 0
SSS: 0
TID: 0.95

## 2019-07-10 MED ORDER — TECHNETIUM TC 99M TETROFOSMIN IV KIT
30.0000 | PACK | Freq: Once | INTRAVENOUS | Status: AC | PRN
  Administered 2019-07-10: 32 via INTRAVENOUS

## 2019-07-10 MED ORDER — REGADENOSON 0.4 MG/5ML IV SOLN
INTRAVENOUS | Status: AC
  Administered 2019-07-10: 0.4 mg via INTRAVENOUS
  Filled 2019-07-10: qty 5

## 2019-07-10 MED ORDER — SODIUM CHLORIDE FLUSH 0.9 % IV SOLN
INTRAVENOUS | Status: AC
  Administered 2019-07-10: 10 mL via INTRAVENOUS
  Filled 2019-07-10: qty 10

## 2019-07-10 MED ORDER — TECHNETIUM TC 99M TETROFOSMIN IV KIT
10.0000 | PACK | Freq: Once | INTRAVENOUS | Status: AC | PRN
  Administered 2019-07-10: 11 via INTRAVENOUS

## 2019-07-12 ENCOUNTER — Telehealth: Payer: Self-pay | Admitting: *Deleted

## 2019-07-12 NOTE — Telephone Encounter (Signed)
Patient informed. Copy sent to PCP °

## 2019-07-12 NOTE — Telephone Encounter (Signed)
-----   Message from Satira Sark, MD sent at 07/10/2019  2:27 PM EDT ----- Results reviewed.  Please let her know that the stress test was low risk overall, no definite ischemic territories to suggest obstructive CAD as cause of symptoms.  Would monitor for any change in symptoms and keep regular follow-up with PCP.

## 2019-07-25 HISTORY — PX: BACK SURGERY: SHX140

## 2019-08-05 ENCOUNTER — Other Ambulatory Visit: Payer: Self-pay | Admitting: Neurosurgery

## 2019-08-09 NOTE — Progress Notes (Signed)
[place] [place] [address] Phone: [phone] Fax: [phone]   Your procedure is scheduled on Wednesday, June 30th.  Report to [facility] Main Entrance "A" at 9:25 A.M., and check in at the Admitting office.  Call this number if you have problems the morning of surgery:  [phone]  Call [phone] if you have any questions prior to your surgery date Monday-Friday 8am-4pm   Remember:  Do not eat or drink after midnight the night before your surgery    Take these medicines the morning of surgery with A SIP OF WATER  DULoxetine (CYMBALTA) esomeprazole (NEXIUM) fexofenadine (ALLEGRA)  fluticasone (FLONASE)/nasal spray metoprolol tartrate (LOPRESSOR)  If needed - acetaminophen (TYLENOL), oxycodone (OXY-IR), rizatriptan (MAXALT)    As of today, STOP taking any Aspirin (unless otherwise instructed by your surgeon) and Aspirin containing products, Aleve, Naproxen, Ibuprofen, Motrin, Advil, Goody's, BC's, all herbal medications, fish oil, and all vitamins.                     Do not wear jewelry, make up, or nail polish            Do not wear lotions, powders, perfumes, or deodorant.            Do not shave 48 hours prior to surgery.              Do not bring valuables to the hospital.            [facility] is not responsible for any belongings or valuables.  Do NOT Smoke (Tobacco/Vapping) or drink Alcohol 24 hours prior to your procedure If you use a CPAP at night, you may bring all equipment for your overnight stay.   Contacts, glasses, dentures or bridgework may not be worn into surgery.      For patients admitted to the hospital, discharge time will be determined by your treatment team.   Patients discharged the day of surgery will not be allowed to drive home, and someone needs to stay with them for 24 hours.  Special instructions:   [facility]- Preparing For Surgery  Before surgery, you can play  an important role. Because skin is not sterile, your skin needs to be as free of germs as possible. You can reduce the number of germs on your skin by washing with CHG (chlorahexidine gluconate) Soap before surgery.  CHG is an antiseptic cleaner which kills germs and bonds with the skin to continue killing germs even after washing.    Oral Hygiene is also important to reduce your risk of infection.  Remember - BRUSH YOUR TEETH THE MORNING OF SURGERY WITH YOUR REGULAR TOOTHPASTE  Please do not use if you have an allergy to CHG or antibacterial soaps. If your skin becomes reddened/irritated stop using the CHG.  Do not shave (including legs and underarms) for at least 48 hours prior to first CHG shower. It is OK to shave your face.  Please follow these instructions carefully.   1. Shower the NIGHT BEFORE SURGERY and the MORNING OF SURGERY with CHG Soap.   2. If you chose to wash your hair, wash your hair first as usual with your normal shampoo.  3. After you shampoo, rinse your hair and body thoroughly to remove the shampoo.  4. Use CHG as you would any other liquid soap. You can apply CHG directly to the skin and wash gently with a scrungie  or a clean washcloth.   5. Apply the CHG Soap to your body ONLY FROM THE NECK DOWN.  Do not use on open wounds or open sores. Avoid contact with your eyes, ears, mouth and genitals (private parts). Wash Face and genitals (private parts)  with your normal soap.   6. Wash thoroughly, paying special attention to the area where your surgery will be performed.  7. Thoroughly rinse your body with warm water from the neck down.  8. DO NOT shower/wash with your normal soap after using and rinsing off the CHG Soap.  9. Pat yourself dry with a CLEAN TOWEL.  10. Wear CLEAN PAJAMAS to bed the night before surgery, wear comfortable clothes the morning of surgery  11. Place CLEAN SHEETS on your bed the night of your first shower and DO NOT SLEEP WITH PETS.  Day of  Surgery: Shower with CHG soap as instructed above.  Do not apply any deodorants/lotions.  Please wear clean clothes to the hospital/surgery center.   Remember to brush your teeth WITH YOUR REGULAR TOOTHPASTE.   Please read over the following fact sheets that you were given.

## 2019-08-12 ENCOUNTER — Other Ambulatory Visit (HOSPITAL_COMMUNITY)
Admission: RE | Admit: 2019-08-12 | Discharge: 2019-08-12 | Disposition: A | Discharge status: Home / Self Care | Payer: BC Managed Care – PPO | Source: Ambulatory Visit | Attending: Neurosurgery | Admitting: Neurosurgery

## 2019-08-12 ENCOUNTER — Encounter (HOSPITAL_COMMUNITY): Payer: Self-pay

## 2019-08-12 ENCOUNTER — Other Ambulatory Visit: Payer: Self-pay

## 2019-08-12 ENCOUNTER — Encounter (HOSPITAL_COMMUNITY)
Admission: RE | Admit: 2019-08-12 | Discharge: 2019-08-12 | Disposition: A | Discharge status: Home / Self Care | Payer: BC Managed Care – PPO | Source: Ambulatory Visit | Attending: Neurosurgery | Admitting: Neurosurgery

## 2019-08-12 DIAGNOSIS — Z01812 Encounter for preprocedural laboratory examination: Secondary | ICD-10-CM | POA: Insufficient documentation

## 2019-08-12 HISTORY — DX: Other complications of anesthesia, initial encounter: T88.59XA

## 2019-08-12 HISTORY — DX: Anxiety disorder, unspecified: F41.9

## 2019-08-12 LAB — SURGICAL PCR SCREEN
MRSA, PCR: NEGATIVE
Staphylococcus aureus: NEGATIVE

## 2019-08-12 LAB — BASIC METABOLIC PANEL WITH GFR
Anion gap: 11 (ref 5–15)
BUN: 18 mg/dL (ref 6–20)
CO2: 26 mmol/L (ref 22–32)
Calcium: 9.1 mg/dL (ref 8.9–10.3)
Chloride: 103 mmol/L (ref 98–111)
Creatinine, Ser: 0.91 mg/dL (ref 0.44–1.00)
GFR calc Af Amer: >60 mL/min (ref >60)
GFR calc non Af Amer: >60 mL/min (ref >60)
Glucose, Bld: 117 mg/dL — ABNORMAL HIGH (ref 70–99)
Potassium: 3.3 mmol/L — ABNORMAL LOW (ref 3.5–5.1)
Sodium: 140 mmol/L (ref 135–145)

## 2019-08-12 LAB — TYPE AND SCREEN
ABO/RH(D): A NEG
Antibody Screen: NEGATIVE

## 2019-08-12 LAB — CBC
HCT: 38.8 % (ref 36.0–46.0)
Hemoglobin: 12.2 g/dL (ref 12.0–15.0)
MCH: 28.1 pg (ref 26.0–34.0)
MCHC: 31.4 g/dL (ref 30.0–36.0)
MCV: 89.4 fL (ref 80.0–100.0)
Platelets: 435 10*3/uL — ABNORMAL HIGH (ref 150–400)
RBC: 4.34 MIL/uL (ref 3.87–5.11)
RDW: 13.2 % (ref 11.5–15.5)
WBC: 8.5 10*3/uL (ref 4.0–10.5)
nRBC: 0 % (ref 0.0–0.2)

## 2019-08-12 LAB — SARS CORONAVIRUS 2 (TAT 6-24 HRS): SARS Coronavirus 2: NEGATIVE

## 2019-08-12 LAB — ABO/RH: ABO/RH(D): A NEG

## 2019-08-12 NOTE — Progress Notes (Signed)
Patient denies shortness of breath, fever, cough or chest pain.  PCP - Beth @ [facility] Cardiologist - Dr Rozann Lesches Neurology - Dr Marcial Pacas  Chest x-ray - n/a EKG - 08/12/19 Stress Test - 07/10/19 ECHO - 01/01/19 Cardiac Cath - n/a  Anesthesia review: No  Coronavirus Screening Covid test scheduled on 08/12/19 Do you have any of the following symptoms:  Cough yes/no: No Fever (>100.78F)  yes/no: No Runny nose yes/no: No Sore throat yes/no: No Difficulty breathing/shortness of breath  yes/no: No  Have you traveled in the last 14 days and where? yes/no: No  Patient verbalized understanding of instructions that were given to them at the PAT appointment.

## 2019-08-13 NOTE — Anesthesia Preprocedure Evaluation (Addendum)
Anesthesia Evaluation  Patient identified by MRN, date of birth, ID band Patient awake    Reviewed: Allergy & Precautions, H&P , NPO status , Patient's Chart, lab work & pertinent test results  Airway Mallampati: II  TM Distance: >3 FB Neck ROM: Full    Dental no notable dental hx. (+) Teeth Intact, Dental Advisory Given   Pulmonary neg pulmonary ROS, former smoker,    Pulmonary exam normal breath sounds clear to auscultation       Cardiovascular hypertension, Pt. on medications  Rhythm:Regular Rate:Normal  Nuclear stress test 07/10/19: No diagnostic ST segment changes to indicate ischemia. No significant myocardial perfusion defects to indicate scar or ischemia. Breast attenuation artifact is noted. Also radiotracer uptake within the gut near the inferior wall. This is a low risk study. Nuclear stress EF: 63%.  Long-term cardiac monitor 06/03/19-06/14/19: Predominant rhythm is sinus with heart rate ranging from 42 bpm up to 141 bpm and average heart rate 69 bpm.  Rare PACs and PVCs were noted representing less than 1% of total beats.  Very brief burst of SVT lasting 4 beats was noted, no sustained arrhythmias however and no pauses.  Echo 01/01/19: 1. Left ventricular ejection fraction, by visual estimation, is 60 to  65%. The left ventricle has normal function. There is no left ventricular  hypertrophy.  2. The left ventricle has no regional wall motion abnormalities.  3. Global right ventricle has normal systolic function.The right  ventricular size is normal. No increase in right ventricular wall  thickness.  4. Left atrial size was severely dilated.  5. Right atrial size was normal.  6. The mitral valve is grossly normal. No evidence of mitral valve  regurgitation.  7. The tricuspid valve is grossly normal. Tricuspid valve regurgitation  is not demonstrated.  8. The aortic valve is tricuspid. Aortic valve  regurgitation is not  visualized. No evidence of aortic valve sclerosis or stenosis.  9. The pulmonic valve was grossly normal. Pulmonic valve regurgitation is  not visualized.  10. The inferior vena cava is dilated in size with >50% respiratory  variability, suggesting right atrial pressure of 8 mmHg.     Neuro/Psych  Headaches, Anxiety Depression    GI/Hepatic Neg liver ROS, GERD  Medicated,  Endo/Other  negative endocrine ROS  Renal/GU negative Renal ROS  negative genitourinary   Musculoskeletal   Abdominal   Peds  Hematology negative hematology ROS (+)   Anesthesia Other Findings   Reproductive/Obstetrics negative OB ROS                           Anesthesia Physical Anesthesia Plan  ASA: II  Anesthesia Plan: General   Post-op Pain Management:    Induction: Intravenous  PONV Risk Score and Plan: 4 or greater and Ondansetron, Dexamethasone and Midazolam  Airway Management Planned: Oral ETT  Additional Equipment:   Intra-op Plan:   Post-operative Plan: Extubation in OR  Informed Consent: I have reviewed the patients History and Physical, chart, labs and discussed the procedure including the risks, benefits and alternatives for the proposed anesthesia with the patient or authorized representative who has indicated his/her understanding and acceptance.     Dental advisory given  Plan Discussed with: CRNA  Anesthesia Plan Comments: (PAT note written 08/13/2019 by Myra Gianotti, PA-C. )       Anesthesia Quick Evaluation

## 2019-08-13 NOTE — Progress Notes (Signed)
Anesthesia Chart Review:  Case: [id number] Date/Time: 08/14/19 1112   Procedure: Lumbar 4-5 posterior lumbar interbody fusion (N/A ) - 3C   Anesthesia type: General   Pre-op diagnosis:      Lumbar stenosis     Instability   Location: [facility] OR ROOM 29 / [place] OR   Surgeons: Ashok Pall, MD      DISCUSSION: Patient is a 59 year old female scheduled for the above procedure.  History includes former smoker (quit 02/15/96), HTN, Bell's Palsy (left, 03/2019), OCD/anxiety, acid reflux, +ANA, skin cancer, migraines, hysterectomy. BMI is consistent with obesity.  Said she was slow to wake up after wisdom teeth extraction but otherwise no anesthesia issues.  08/12/2019 presurgical COVID-19 test negative.  Anesthesia team to evaluate on the day of surgery.  VS: BP 125/75   Pulse 65   Temp 36.8 C (Oral)   Resp 18   Ht 5\' 7"  (1.702 m)   Wt 97.2 kg   LMP  (LMP Unknown)   SpO2 98%   BMI 33.55 kg/m    PROVIDERS: Leonie Douglas, MD his listed as PCP. Per PAT RN interview: PCP is "Corporate treasurer" at [place], MD is cardiologist. She had recent work-up for palpitations and dyspnea on exertion including echo, cardiac monitor, stress test (see below).  Sinda Du, MD is pulmonologist (recently retired). 12/25/18 office note scanned under Media tab.   LABS: Labs reviewed: Acceptable for surgery. (all labs ordered are listed, but only abnormal results are displayed)  Labs Reviewed  CBC - Abnormal; Notable for the following components:      Result Value   Platelets 435 (*)    All other components within normal limits  BASIC METABOLIC PANEL - Abnormal; Notable for the following components:   Potassium 3.3 (*)    Glucose, Bld 117 (*)    All other components within normal limits  SURGICAL PCR SCREEN  TYPE AND SCREEN    PFTs 01/31/19: FVC 2.54 (66%), post 2.63 (69%). FEV1 2.01 (68%), post 2.14 (72%). DLCO unc 16.06 (70%).   IMAGES: CT L-spine/myelogram  07/05/19: IMPRESSION: Facet osteoarthritis and disc bulging mainly at L3-4 and below. Dominant findings at L4-5 where there is moderate spinal stenosis that worsens with standing and extension.   EKG: 05/28/19 ([facility]): Normal sinus rhythm Left axis deviation Left ventricular hypertrophy with repolarization abnormality Cannot rule out septal infarct, age undetermined   CV: Nuclear stress test 07/10/19:  No diagnostic ST segment changes to indicate ischemia.  No significant myocardial perfusion defects to indicate scar or ischemia. Breast attenuation artifact is noted. Also radiotracer uptake within the gut near the inferior wall.  This is a low risk study.  Nuclear stress EF: 63%.  Long-term cardiac monitor 06/03/19-06/14/19: Study Highlights: "ZIO XT reviewed.  10 days 7 hours analyzed.  Predominant rhythm is sinus with heart rate ranging from 42 bpm up to 141 bpm and average heart rate 69 bpm.  Rare PACs and PVCs were noted representing less than 1% of total beats.  Very brief burst of SVT lasting 4 beats was noted, no sustained arrhythmias however and no pauses."  Echo 01/01/19: IMPRESSIONS  1. Left ventricular ejection fraction, by visual estimation, is 60 to  65%. The left ventricle has normal function. There is no left ventricular  hypertrophy.  2. The left ventricle has no regional wall motion abnormalities.  3. Global right ventricle has normal systolic function.The right  ventricular size is normal. No increase in right ventricular wall  thickness.  4. Left atrial size was severely dilated.  5. Right atrial size was normal.  6. The mitral valve is grossly normal. No evidence of mitral valve  regurgitation.  7. The tricuspid valve is grossly normal. Tricuspid valve regurgitation  is not demonstrated.  8. The aortic valve is tricuspid. Aortic valve regurgitation is not  visualized. No evidence of aortic valve sclerosis or stenosis.  9. The pulmonic  valve was grossly normal. Pulmonic valve regurgitation is  not visualized.  10. The inferior vena cava is dilated in size with >50% respiratory  variability, suggesting right atrial pressure of 8 mmHg.    Past Medical History:  Diagnosis Date  . Acid reflux   . Anxiety    OCD  . Bell's palsy 03/2019  . Cancer ([place])    Skin right arm and left ear, left breast  . Complication of anesthesia    slow to wake up during wisdom teeth ext, no other problems with the other surgery  . Depression   . 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 Hypertension   . Migraine   . Positive ANA (antinuclear antibody)    no problems, no meds    Past Surgical History:  Procedure Laterality Date  . ABDOMINAL HYSTERECTOMY    . APPENDECTOMY  2002  . BACK SURGERY  07/25/2019   Laminectomy and poraminotomy lumbar @ [facility] [place]  . CARPAL TUNNEL RELEASE  2015  . CHOLECYSTECTOMY  2004  . COLONOSCOPY  10/04/2010   Procedure: COLONOSCOPY;  Surgeon: Daneil Dolin, MD;  Location: [facility] SUITE;  Service: Endoscopy;  Laterality: N/A;  8:15AM  . EXTERNAL EAR SURGERY Left 01/2018   Skin cancer  . FOOT SURGERY Bilateral 2018  . SKIN CANCER EXCISION Right 09/20/2018   Right arm  . TENDON REPAIR    . ULNAR NERVE REPAIR    . UPPER GI ENDOSCOPY    . WISDOM TOOTH EXTRACTION      MEDICATIONS: . diphenhydrAMINE HCl (BENADRYL PO)  . acetaminophen (TYLENOL) 500 MG tablet  . chlorthalidone (HYGROTON) 25 MG tablet  . Cholecalciferol (VITAMIN D) 50 MCG (2000 UT) tablet  . DULoxetine (CYMBALTA) 30 MG capsule  . esomeprazole (NEXIUM) 20 MG capsule  . fexofenadine (ALLEGRA) 180 MG tablet  . fluticasone (FLONASE) 50 MCG/ACT nasal spray  . metoprolol tartrate (LOPRESSOR) 25 MG tablet  . oxycodone (OXY-IR) 5 MG capsule  . rizatriptan (MAXALT) 10 MG tablet  . tiZANidine (ZANAFLEX) 4 MG tablet   No current facility-administered medications for  this encounter.    Myra Gianotti, PA-C Surgical Short Stay/Anesthesiology [facility] Phone [phone] [facility] Phone [phone] 08/13/2019 5:50 PM

## 2019-08-14 ENCOUNTER — Encounter (HOSPITAL_COMMUNITY): Payer: Self-pay | Admitting: Neurosurgery

## 2019-08-14 ENCOUNTER — Other Ambulatory Visit: Payer: Self-pay

## 2019-08-14 ENCOUNTER — Inpatient Hospital Stay (HOSPITAL_COMMUNITY): Payer: BC Managed Care – PPO

## 2019-08-14 ENCOUNTER — Inpatient Hospital Stay (HOSPITAL_COMMUNITY): Payer: BC Managed Care – PPO | Admitting: Anesthesiology

## 2019-08-14 ENCOUNTER — Inpatient Hospital Stay (HOSPITAL_COMMUNITY)
Admission: RE | Admit: 2019-08-14 | Discharge: 2019-08-15 | DRG: 460 | Disposition: A | Discharge status: Home / Self Care | Payer: BC Managed Care – PPO | Attending: Neurosurgery | Admitting: Neurosurgery

## 2019-08-14 ENCOUNTER — Inpatient Hospital Stay (HOSPITAL_COMMUNITY): Payer: BC Managed Care – PPO | Admitting: Vascular Surgery

## 2019-08-14 ENCOUNTER — Encounter (HOSPITAL_COMMUNITY)
Admission: RE | Disposition: A | Discharge status: Home / Self Care | Payer: Self-pay | Source: Home / Self Care | Attending: Neurosurgery

## 2019-08-14 DIAGNOSIS — M48062 Spinal stenosis, lumbar region with neurogenic claudication: Principal | ICD-10-CM | POA: Diagnosis present

## 2019-08-14 DIAGNOSIS — K219 Gastro-esophageal reflux disease without esophagitis: Secondary | ICD-10-CM | POA: Diagnosis present

## 2019-08-14 DIAGNOSIS — Z823 Family history of stroke: Secondary | ICD-10-CM | POA: Diagnosis not present

## 2019-08-14 DIAGNOSIS — Z888 Allergy status to other drugs, medicaments and biological substances status: Secondary | ICD-10-CM | POA: Diagnosis not present

## 2019-08-14 DIAGNOSIS — F329 Major depressive disorder, single episode, unspecified: Secondary | ICD-10-CM | POA: Diagnosis present

## 2019-08-14 DIAGNOSIS — M47816 Spondylosis without myelopathy or radiculopathy, lumbar region: Secondary | ICD-10-CM | POA: Diagnosis present

## 2019-08-14 DIAGNOSIS — Z87891 Personal history of nicotine dependence: Secondary | ICD-10-CM

## 2019-08-14 DIAGNOSIS — Z79899 Other long term (current) drug therapy: Secondary | ICD-10-CM | POA: Diagnosis not present

## 2019-08-14 DIAGNOSIS — Z882 Allergy status to sulfonamides status: Secondary | ICD-10-CM

## 2019-08-14 DIAGNOSIS — Z885 Allergy status to narcotic agent status: Secondary | ICD-10-CM | POA: Diagnosis not present

## 2019-08-14 DIAGNOSIS — M532X6 Spinal instabilities, lumbar region: Secondary | ICD-10-CM | POA: Diagnosis present

## 2019-08-14 DIAGNOSIS — I10 Essential (primary) hypertension: Secondary | ICD-10-CM | POA: Diagnosis present

## 2019-08-14 DIAGNOSIS — Z8249 Family history of ischemic heart disease and other diseases of the circulatory system: Secondary | ICD-10-CM | POA: Diagnosis not present

## 2019-08-14 DIAGNOSIS — Z20822 Contact with and (suspected) exposure to covid-19: Secondary | ICD-10-CM | POA: Diagnosis present

## 2019-08-14 DIAGNOSIS — Z886 Allergy status to analgesic agent status: Secondary | ICD-10-CM | POA: Diagnosis not present

## 2019-08-14 DIAGNOSIS — Z881 Allergy status to other antibiotic agents status: Secondary | ICD-10-CM

## 2019-08-14 DIAGNOSIS — Z981 Arthrodesis status: Secondary | ICD-10-CM

## 2019-08-14 DIAGNOSIS — Z91040 Latex allergy status: Secondary | ICD-10-CM

## 2019-08-14 DIAGNOSIS — Z85828 Personal history of other malignant neoplasm of skin: Secondary | ICD-10-CM | POA: Diagnosis not present

## 2019-08-14 DIAGNOSIS — Z419 Encounter for procedure for purposes other than remedying health state, unspecified: Secondary | ICD-10-CM

## 2019-08-14 LAB — CBC
HCT: 33.2 % — ABNORMAL LOW (ref 36.0–46.0)
Hemoglobin: 10.5 g/dL — ABNORMAL LOW (ref 12.0–15.0)
MCH: 28.2 pg (ref 26.0–34.0)
MCHC: 31.6 g/dL (ref 30.0–36.0)
MCV: 89.2 fL (ref 80.0–100.0)
Platelets: 409 10*3/uL — ABNORMAL HIGH (ref 150–400)
RBC: 3.72 MIL/uL — ABNORMAL LOW (ref 3.87–5.11)
RDW: 13.4 % (ref 11.5–15.5)
WBC: 16 10*3/uL — ABNORMAL HIGH (ref 4.0–10.5)
nRBC: 0 % (ref 0.0–0.2)

## 2019-08-14 SURGERY — POSTERIOR LUMBAR FUSION 1 LEVEL
Anesthesia: General | Site: Back

## 2019-08-14 MED ORDER — CHLORHEXIDINE GLUCONATE CLOTH 2 % EX PADS: 6.0000 | MEDICATED_PAD | Freq: Once | CUTANEOUS | Status: DC

## 2019-08-14 MED ORDER — VANCOMYCIN HCL IN DEXTROSE 1-5 GM/200ML-% IV SOLN
1000.0000 mg | INTRAVENOUS | Status: AC
  Administered 2019-08-14: 1000 mg via INTRAVENOUS
  Filled 2019-08-14: qty 200

## 2019-08-14 MED ORDER — ONDANSETRON HCL 4 MG/2ML IJ SOLN
INTRAMUSCULAR | Status: DC | PRN
  Administered 2019-08-14 (×2): 4 mg via INTRAVENOUS

## 2019-08-14 MED ORDER — ACETAMINOPHEN 650 MG RE SUPP: 650.0000 mg | RECTAL | Status: DC | PRN

## 2019-08-14 MED ORDER — MENTHOL 3 MG MT LOZG: 1.0000 | LOZENGE | OROMUCOSAL | Status: DC | PRN

## 2019-08-14 MED ORDER — ORAL CARE MOUTH RINSE: 15.0000 mL | Freq: Once | OROMUCOSAL | Status: AC

## 2019-08-14 MED ORDER — SUMATRIPTAN SUCCINATE 50 MG PO TABS
50.0000 mg | ORAL_TABLET | ORAL | Status: DC | PRN
  Filled 2019-08-14: qty 1

## 2019-08-14 MED ORDER — DEXAMETHASONE SODIUM PHOSPHATE 10 MG/ML IJ SOLN
INTRAMUSCULAR | Status: DC | PRN
  Administered 2019-08-14: 10 mg via INTRAVENOUS

## 2019-08-14 MED ORDER — BACITRACIN ZINC 500 UNIT/GM EX OINT
TOPICAL_OINTMENT | CUTANEOUS | Status: AC
  Filled 2019-08-14: qty 28.35

## 2019-08-14 MED ORDER — BUPIVACAINE HCL (PF) 0.5 % IJ SOLN
INTRAMUSCULAR | Status: DC | PRN
  Administered 2019-08-14: 30 mL

## 2019-08-14 MED ORDER — PROPOFOL 10 MG/ML IV BOLUS
INTRAVENOUS | Status: DC | PRN
  Administered 2019-08-14: 100 mg via INTRAVENOUS

## 2019-08-14 MED ORDER — CHLORHEXIDINE GLUCONATE 0.12 % MT SOLN
15.0000 mL | Freq: Once | OROMUCOSAL | Status: AC
  Administered 2019-08-14: 15 mL via OROMUCOSAL
  Filled 2019-08-14: qty 15

## 2019-08-14 MED ORDER — POTASSIUM CHLORIDE IN NACL 20-0.9 MEQ/L-% IV SOLN: INTRAVENOUS | Status: DC

## 2019-08-14 MED ORDER — PHENOL 1.4 % MT LIQD: 1.0000 | OROMUCOSAL | Status: DC | PRN

## 2019-08-14 MED ORDER — LIDOCAINE 2% (20 MG/ML) 5 ML SYRINGE
INTRAMUSCULAR | Status: DC | PRN
  Administered 2019-08-14: 40 mg via INTRAVENOUS

## 2019-08-14 MED ORDER — CEFAZOLIN SODIUM-DEXTROSE 2-4 GM/100ML-% IV SOLN
2.0000 g | Freq: Three times a day (TID) | INTRAVENOUS | Status: AC
  Administered 2019-08-14 – 2019-08-15 (×2): 2 g via INTRAVENOUS
  Filled 2019-08-14 (×2): qty 100

## 2019-08-14 MED ORDER — HEPARIN SODIUM (PORCINE) 5000 UNIT/ML IJ SOLN
5000.0000 [IU] | Freq: Three times a day (TID) | INTRAMUSCULAR | Status: DC
  Administered 2019-08-14 – 2019-08-15 (×3): 5000 [IU] via SUBCUTANEOUS
  Filled 2019-08-14 (×3): qty 1

## 2019-08-14 MED ORDER — ROCURONIUM BROMIDE 10 MG/ML (PF) SYRINGE
PREFILLED_SYRINGE | INTRAVENOUS | Status: DC | PRN
  Administered 2019-08-14: 60 mg via INTRAVENOUS

## 2019-08-14 MED ORDER — FENTANYL CITRATE (PF) 250 MCG/5ML IJ SOLN
INTRAMUSCULAR | Status: AC
  Filled 2019-08-14: qty 5

## 2019-08-14 MED ORDER — BISACODYL 5 MG PO TBEC: 5.0000 mg | DELAYED_RELEASE_TABLET | Freq: Every day | ORAL | Status: DC | PRN

## 2019-08-14 MED ORDER — ROCURONIUM BROMIDE 10 MG/ML (PF) SYRINGE
PREFILLED_SYRINGE | INTRAVENOUS | Status: AC
  Filled 2019-08-14: qty 10

## 2019-08-14 MED ORDER — HYDROMORPHONE HCL 1 MG/ML IJ SOLN
0.2500 mg | INTRAMUSCULAR | Status: DC | PRN
  Administered 2019-08-14 (×3): 0.5 mg via INTRAVENOUS

## 2019-08-14 MED ORDER — SENNOSIDES-DOCUSATE SODIUM 8.6-50 MG PO TABS: 1.0000 | ORAL_TABLET | Freq: Every evening | ORAL | Status: DC | PRN

## 2019-08-14 MED ORDER — THROMBIN 20000 UNITS EX SOLR: CUTANEOUS | Status: DC | PRN

## 2019-08-14 MED ORDER — ALBUMIN HUMAN 5 % IV SOLN
INTRAVENOUS | Status: DC | PRN
Start: 2019-08-14 — End: 2019-08-14

## 2019-08-14 MED ORDER — SODIUM CHLORIDE 0.9% FLUSH: 3.0000 mL | Freq: Two times a day (BID) | INTRAVENOUS | Status: DC

## 2019-08-14 MED ORDER — MIDAZOLAM HCL 2 MG/2ML IJ SOLN
INTRAMUSCULAR | Status: DC | PRN
  Administered 2019-08-14: 2 mg via INTRAVENOUS

## 2019-08-14 MED ORDER — HYDROMORPHONE HCL 1 MG/ML IJ SOLN
INTRAMUSCULAR | Status: AC
  Filled 2019-08-14: qty 1

## 2019-08-14 MED ORDER — SENNA 8.6 MG PO TABS
1.0000 | ORAL_TABLET | Freq: Two times a day (BID) | ORAL | Status: DC
  Administered 2019-08-14 – 2019-08-15 (×2): 8.6 mg via ORAL
  Filled 2019-08-14 (×2): qty 1

## 2019-08-14 MED ORDER — LIDOCAINE 2% (20 MG/ML) 5 ML SYRINGE
INTRAMUSCULAR | Status: AC
  Filled 2019-08-14: qty 5

## 2019-08-14 MED ORDER — SODIUM CHLORIDE 0.9% FLUSH: 3.0000 mL | INTRAVENOUS | Status: DC | PRN

## 2019-08-14 MED ORDER — OXYCODONE HCL 5 MG PO TABS
ORAL_TABLET | ORAL | Status: AC
  Filled 2019-08-14: qty 2

## 2019-08-14 MED ORDER — VITAMIN D3 25 MCG (1000 UNIT) PO TABS
2000.0000 [IU] | ORAL_TABLET | Freq: Every day | ORAL | Status: DC
  Administered 2019-08-14 – 2019-08-15 (×2): 2000 [IU] via ORAL
  Filled 2019-08-14 (×2): qty 2

## 2019-08-14 MED ORDER — METOPROLOL TARTRATE 25 MG PO TABS
25.0000 mg | ORAL_TABLET | Freq: Two times a day (BID) | ORAL | Status: DC
  Administered 2019-08-14 – 2019-08-15 (×2): 25 mg via ORAL
  Filled 2019-08-14 (×2): qty 1

## 2019-08-14 MED ORDER — DIAZEPAM 5 MG PO TABS
ORAL_TABLET | ORAL | Status: AC
  Filled 2019-08-14: qty 1

## 2019-08-14 MED ORDER — ONDANSETRON HCL 4 MG PO TABS: 4.0000 mg | ORAL_TABLET | Freq: Four times a day (QID) | ORAL | Status: DC | PRN

## 2019-08-14 MED ORDER — DIPHENHYDRAMINE HCL 50 MG/ML IJ SOLN
INTRAMUSCULAR | Status: AC
  Filled 2019-08-14: qty 1

## 2019-08-14 MED ORDER — PANTOPRAZOLE SODIUM 40 MG PO TBEC
40.0000 mg | DELAYED_RELEASE_TABLET | Freq: Every day | ORAL | Status: DC
  Administered 2019-08-14 – 2019-08-15 (×2): 40 mg via ORAL
  Filled 2019-08-14 (×2): qty 1

## 2019-08-14 MED ORDER — SODIUM CHLORIDE 0.9 % IV SOLN: 250.0000 mL | INTRAVENOUS | Status: DC

## 2019-08-14 MED ORDER — PROPOFOL 10 MG/ML IV BOLUS
INTRAVENOUS | Status: AC
  Filled 2019-08-14: qty 20

## 2019-08-14 MED ORDER — ONDANSETRON HCL 4 MG/2ML IJ SOLN
INTRAMUSCULAR | Status: AC
  Filled 2019-08-14: qty 2

## 2019-08-14 MED ORDER — DIAZEPAM 5 MG PO TABS
5.0000 mg | ORAL_TABLET | Freq: Four times a day (QID) | ORAL | Status: DC | PRN
  Administered 2019-08-14 – 2019-08-15 (×2): 5 mg via ORAL
  Filled 2019-08-14: qty 1

## 2019-08-14 MED ORDER — MAGNESIUM CITRATE PO SOLN: 1.0000 | Freq: Once | ORAL | Status: DC | PRN

## 2019-08-14 MED ORDER — DEXAMETHASONE SODIUM PHOSPHATE 10 MG/ML IJ SOLN
INTRAMUSCULAR | Status: AC
  Filled 2019-08-14: qty 1

## 2019-08-14 MED ORDER — LACTATED RINGERS IV SOLN: INTRAVENOUS | Status: DC

## 2019-08-14 MED ORDER — OXYCODONE HCL ER 10 MG PO T12A
10.0000 mg | EXTENDED_RELEASE_TABLET | Freq: Two times a day (BID) | ORAL | Status: DC
  Administered 2019-08-14 – 2019-08-15 (×2): 10 mg via ORAL
  Filled 2019-08-14 (×2): qty 1

## 2019-08-14 MED ORDER — THROMBIN 20000 UNITS EX SOLR
CUTANEOUS | Status: AC
  Filled 2019-08-14: qty 20000

## 2019-08-14 MED ORDER — KETOROLAC TROMETHAMINE 15 MG/ML IJ SOLN
15.0000 mg | Freq: Four times a day (QID) | INTRAMUSCULAR | Status: DC
  Administered 2019-08-14 – 2019-08-15 (×2): 15 mg via INTRAVENOUS
  Filled 2019-08-14 (×3): qty 1

## 2019-08-14 MED ORDER — DULOXETINE HCL 30 MG PO CPEP
30.0000 mg | ORAL_CAPSULE | Freq: Two times a day (BID) | ORAL | Status: DC
  Administered 2019-08-14 – 2019-08-15 (×2): 30 mg via ORAL
  Filled 2019-08-14 (×2): qty 1

## 2019-08-14 MED ORDER — BUPIVACAINE HCL (PF) 0.5 % IJ SOLN
INTRAMUSCULAR | Status: AC
  Filled 2019-08-14: qty 30

## 2019-08-14 MED ORDER — OXYCODONE HCL 5 MG PO TABS
10.0000 mg | ORAL_TABLET | ORAL | Status: DC | PRN
  Administered 2019-08-14 – 2019-08-15 (×3): 10 mg via ORAL
  Filled 2019-08-14 (×2): qty 2

## 2019-08-14 MED ORDER — ZOLPIDEM TARTRATE 5 MG PO TABS: 5.0000 mg | ORAL_TABLET | Freq: Every evening | ORAL | Status: DC | PRN

## 2019-08-14 MED ORDER — LIDOCAINE-EPINEPHRINE 0.5 %-1:200000 IJ SOLN
INTRAMUSCULAR | Status: AC
  Filled 2019-08-14: qty 1

## 2019-08-14 MED ORDER — ONDANSETRON HCL 4 MG/2ML IJ SOLN: 4.0000 mg | Freq: Four times a day (QID) | INTRAMUSCULAR | Status: DC | PRN

## 2019-08-14 MED ORDER — ACETAMINOPHEN 325 MG PO TABS: 650.0000 mg | ORAL_TABLET | ORAL | Status: DC | PRN

## 2019-08-14 MED ORDER — MIDAZOLAM HCL 2 MG/2ML IJ SOLN
INTRAMUSCULAR | Status: AC
  Filled 2019-08-14: qty 2

## 2019-08-14 MED ORDER — FENTANYL CITRATE (PF) 250 MCG/5ML IJ SOLN
INTRAMUSCULAR | Status: DC | PRN
  Administered 2019-08-14: 100 ug via INTRAVENOUS
  Administered 2019-08-14 (×5): 50 ug via INTRAVENOUS

## 2019-08-14 MED ORDER — PHENYLEPHRINE HCL-NACL 10-0.9 MG/250ML-% IV SOLN
INTRAVENOUS | Status: DC | PRN
  Administered 2019-08-14: 25 ug/min via INTRAVENOUS

## 2019-08-14 MED ORDER — CHLORTHALIDONE 25 MG PO TABS
25.0000 mg | ORAL_TABLET | Freq: Every day | ORAL | Status: DC
  Administered 2019-08-14 – 2019-08-15 (×2): 25 mg via ORAL
  Filled 2019-08-14 (×2): qty 1

## 2019-08-14 MED ORDER — DIPHENHYDRAMINE HCL 50 MG/ML IJ SOLN
12.5000 mg | Freq: Once | INTRAMUSCULAR | Status: AC
  Administered 2019-08-14: 12.5 mg via INTRAVENOUS

## 2019-08-14 MED ORDER — SUGAMMADEX SODIUM 200 MG/2ML IV SOLN
INTRAVENOUS | Status: DC | PRN
  Administered 2019-08-14: 200 mg via INTRAVENOUS

## 2019-08-14 MED ORDER — FLUTICASONE PROPIONATE 50 MCG/ACT NA SUSP
1.0000 | Freq: Every day | NASAL | Status: DC
  Administered 2019-08-14 – 2019-08-15 (×2): 1 via NASAL
  Filled 2019-08-14: qty 16

## 2019-08-14 MED ORDER — ACETAMINOPHEN 500 MG PO TABS
1000.0000 mg | ORAL_TABLET | Freq: Once | ORAL | Status: AC
  Administered 2019-08-14: 1000 mg via ORAL

## 2019-08-14 MED ORDER — LORATADINE 10 MG PO TABS
10.0000 mg | ORAL_TABLET | Freq: Every day | ORAL | Status: DC
  Administered 2019-08-14 – 2019-08-15 (×2): 10 mg via ORAL
  Filled 2019-08-14 (×2): qty 1

## 2019-08-14 MED ORDER — ACETAMINOPHEN 500 MG PO TABS
ORAL_TABLET | ORAL | Status: AC
  Filled 2019-08-14: qty 2

## 2019-08-14 MED ORDER — OXYCODONE HCL 5 MG PO TABS: 5.0000 mg | ORAL_TABLET | ORAL | Status: DC | PRN

## 2019-08-14 MED ORDER — 0.9 % SODIUM CHLORIDE (POUR BTL) OPTIME
TOPICAL | Status: DC | PRN
  Administered 2019-08-14: 1000 mL

## 2019-08-14 MED ORDER — PHENYLEPHRINE 40 MCG/ML (10ML) SYRINGE FOR IV PUSH (FOR BLOOD PRESSURE SUPPORT)
PREFILLED_SYRINGE | INTRAVENOUS | Status: DC | PRN
  Administered 2019-08-14: 80 ug via INTRAVENOUS

## 2019-08-14 SURGICAL SUPPLY — 79 items
ADH SKN CLS APL DERMABOND .7 (GAUZE/BANDAGES/DRESSINGS) ×1
APL SKNCLS STERI-STRIP NONHPOA (GAUZE/BANDAGES/DRESSINGS)
BASKET BONE COLLECTION (BASKET) ×1 IMPLANT
BENZOIN TINCTURE PRP APPL 2/3 (GAUZE/BANDAGES/DRESSINGS) IMPLANT
BLADE CLIPPER SURG (BLADE) IMPLANT
BLADE SURG 15 STRL LF DISP TIS (BLADE) IMPLANT
BLADE SURG 15 STRL SS (BLADE) ×2
BUR MATCHSTICK NEURO 3.0 LAGG (BURR) ×2 IMPLANT
BUR PRECISION FLUTE 5.0 (BURR) ×2 IMPLANT
CAGE POST IBF 11X4D 26/9 (Cage) ×2 IMPLANT
CANISTER SUCT 3000ML PPV (MISCELLANEOUS) ×2 IMPLANT
CAP RELINE MOD TULIP RMM (Cap) ×4 IMPLANT
CARTRIDGE OIL MAESTRO DRILL (MISCELLANEOUS) ×1 IMPLANT
CNTNR URN SCR LID CUP LEK RST (MISCELLANEOUS) ×1 IMPLANT
CONT SPEC 4OZ STRL OR WHT (MISCELLANEOUS) ×2
COVER BACK TABLE 60X90IN (DRAPES) ×2 IMPLANT
COVER WAND RF STERILE (DRAPES) ×1 IMPLANT
DECANTER SPIKE VIAL GLASS SM (MISCELLANEOUS) ×2 IMPLANT
DERMABOND ADVANCED (GAUZE/BANDAGES/DRESSINGS) ×1
DERMABOND ADVANCED .7 DNX12 (GAUZE/BANDAGES/DRESSINGS) ×1 IMPLANT
DIFFUSER DRILL AIR PNEUMATIC (MISCELLANEOUS) ×2 IMPLANT
DRAPE C-ARM 42X72 X-RAY (DRAPES) ×3 IMPLANT
DRAPE C-ARMOR (DRAPES) ×1 IMPLANT
DRAPE INCISE 23X17 IOBAN STRL (DRAPES) ×1
DRAPE INCISE 23X17 STRL (DRAPES) IMPLANT
DRAPE INCISE IOBAN 23X17 STRL (DRAPES) ×1 IMPLANT
DRAPE LAPAROTOMY 100X72X124 (DRAPES) ×2 IMPLANT
DRAPE SURG 17X23 STRL (DRAPES) ×2 IMPLANT
DRSG TEGADERM 4X4.75 (GAUZE/BANDAGES/DRESSINGS) ×1 IMPLANT
DURAPREP 26ML APPLICATOR (WOUND CARE) ×1 IMPLANT
ELECT BLADE 4.0 EZ CLEAN MEGAD (MISCELLANEOUS) ×2
ELECT REM PT RETURN 9FT ADLT (ELECTROSURGICAL) ×2
ELECTRODE BLDE 4.0 EZ CLN MEGD (MISCELLANEOUS) IMPLANT
ELECTRODE REM PT RTRN 9FT ADLT (ELECTROSURGICAL) ×1 IMPLANT
GAUZE 4X4 16PLY RFD (DISPOSABLE) IMPLANT
GAUZE SPONGE 4X4 12PLY STRL (GAUZE/BANDAGES/DRESSINGS) IMPLANT
GAUZE SPONGE 4X4 12PLY STRL LF (GAUZE/BANDAGES/DRESSINGS) ×1 IMPLANT
GLOVE BIOGEL PI IND STRL 6.5 (GLOVE) IMPLANT
GLOVE BIOGEL PI INDICATOR 6.5 (GLOVE) ×2
GLOVE ECLIPSE 6.5 STRL STRAW (GLOVE) ×2 IMPLANT
GLOVE EXAM NITRILE XL STR (GLOVE) IMPLANT
GLOVE SURG SS PI 6.0 STRL IVOR (GLOVE) ×4 IMPLANT
GLOVE SURG SS PI 6.5 STRL IVOR (GLOVE) ×2 IMPLANT
GOWN STRL REUS W/ TWL LRG LVL3 (GOWN DISPOSABLE) ×2 IMPLANT
GOWN STRL REUS W/ TWL XL LVL3 (GOWN DISPOSABLE) IMPLANT
GOWN STRL REUS W/TWL 2XL LVL3 (GOWN DISPOSABLE) IMPLANT
GOWN STRL REUS W/TWL LRG LVL3 (GOWN DISPOSABLE) ×8
GOWN STRL REUS W/TWL XL LVL3 (GOWN DISPOSABLE) ×2
KIT BASIN OR (CUSTOM PROCEDURE TRAY) ×2 IMPLANT
KIT POSITION SURG JACKSON T1 (MISCELLANEOUS) ×2 IMPLANT
KIT TURNOVER KIT B (KITS) ×2 IMPLANT
MILL MEDIUM DISP (BLADE) IMPLANT
NDL HYPO 25X1 1.5 SAFETY (NEEDLE) ×1 IMPLANT
NDL SPNL 18GX3.5 QUINCKE PK (NEEDLE) IMPLANT
NEEDLE HYPO 25X1 1.5 SAFETY (NEEDLE) ×2 IMPLANT
NEEDLE SPNL 18GX3.5 QUINCKE PK (NEEDLE) ×2 IMPLANT
NS IRRIG 1000ML POUR BTL (IV SOLUTION) ×2 IMPLANT
OIL CARTRIDGE MAESTRO DRILL (MISCELLANEOUS) ×2
PACK LAMINECTOMY NEURO (CUSTOM PROCEDURE TRAY) ×2 IMPLANT
PAD ARMBOARD 7.5X6 YLW CONV (MISCELLANEOUS) ×4 IMPLANT
ROD RELINE COCR LORD 5X30 (Rod) ×2 IMPLANT
SCREW LOCK RSS 4.5/5.0MM (Screw) ×4 IMPLANT
SCREW SHANK RELINE MOD 5.5X35 (Screw) ×2 IMPLANT
SCREW SHANK RELINE MOD 6.5X35 (Screw) ×2 IMPLANT
SPONGE LAP 4X18 RFD (DISPOSABLE) IMPLANT
SPONGE SURGIFOAM ABS GEL 100 (HEMOSTASIS) ×2 IMPLANT
STRIP CLOSURE SKIN 1/2X4 (GAUZE/BANDAGES/DRESSINGS) IMPLANT
SUT ETHILON 3 0 FSL (SUTURE) ×1 IMPLANT
SUT PROLENE 6 0 BV (SUTURE) IMPLANT
SUT VIC AB 0 CT1 18XCR BRD8 (SUTURE) ×1 IMPLANT
SUT VIC AB 0 CT1 8-18 (SUTURE) ×4
SUT VIC AB 2-0 CT1 18 (SUTURE) ×2 IMPLANT
SUT VIC AB 3-0 SH 8-18 (SUTURE) ×2 IMPLANT
TOWEL GREEN STERILE (TOWEL DISPOSABLE) ×2 IMPLANT
TOWEL GREEN STERILE FF (TOWEL DISPOSABLE) ×2 IMPLANT
TRAY FOL W/BAG SLVR 16FR STRL (SET/KITS/TRAYS/PACK) IMPLANT
TRAY FOLEY MTR SLVR 16FR STAT (SET/KITS/TRAYS/PACK) ×1 IMPLANT
TRAY FOLEY W/BAG SLVR 16FR LF (SET/KITS/TRAYS/PACK) ×2
WATER STERILE IRR 1000ML POUR (IV SOLUTION) ×2 IMPLANT

## 2019-08-14 NOTE — Anesthesia Postprocedure Evaluation (Signed)
Anesthesia Post Note  Patient: Elizabeth Cohen  Procedure(s) Performed: Lumbar four-five posterior lumbar interbody fusion (N/A Back)     Patient location during evaluation: PACU Anesthesia Type: General Level of consciousness: awake and alert Pain management: pain level controlled Vital Signs Assessment: post-procedure vital signs reviewed and stable Respiratory status: spontaneous breathing, nonlabored ventilation and respiratory function stable Cardiovascular status: blood pressure returned to baseline and stable Postop Assessment: no apparent nausea or vomiting Anesthetic complications: no   No complications documented.  Last Vitals:  Vitals:   08/14/19 2100 08/14/19 2115  BP: 124/72 125/74  Pulse: 94 97  Resp: (!) 21 10  Temp:    SpO2: 99% 96%    Last Pain:  Vitals:   08/14/19 2115  TempSrc:   PainSc: [place]

## 2019-08-14 NOTE — Transfer of Care (Signed)
Immediate Anesthesia Transfer of Care Note  Patient: Elizabeth Cohen  Procedure(s) Performed: Lumbar four-five posterior lumbar interbody fusion (N/A Back)  Patient Location: PACU  Anesthesia Type:General  Level of Consciousness: drowsy  Airway & Oxygen Therapy: Patient Spontanous Breathing and Patient connected to face mask oxygen  Post-op Assessment: Report given to RN and Post -op Vital signs reviewed and stable  Post vital signs: Reviewed and stable  Last Vitals:  Vitals Value Taken Time  BP 129/70 08/14/19 2000  Temp    Pulse 84 08/14/19 2005  Resp 18 08/14/19 2005  SpO2 98 % 08/14/19 2005  Vitals shown include unvalidated device data.  Last Pain:  Vitals:   08/14/19 0941  TempSrc:   PainSc: 4       Patients Stated Pain Goal: 3 (44/[id number]/86 [id number])  Complications: No complications documented.

## 2019-08-14 NOTE — Op Note (Signed)
08/14/2019  8:04 PM  PATIENT:  Elizabeth Cohen  59 y.o. female with severe facet arthropathy, and spinal stenosis causing neurogenic claudication. I aborted a simple decompression recently because the facets were incompetent and arthropathic. She returns for a lumbar fusion and decompression  PRE-OPERATIVE DIAGNOSIS:  Lumbar stenosis L4/5 Instability  POST-OPERATIVE DIAGNOSIS:  Lumbar stenosis L4/5 Instability  PROCEDURE:  Procedure(s): Lumbar four-five posterior lumbar interbody fusion, Synthes titanium cages 11 x56mm filled with autograft morsels Laminectomy L4 beyond the needed exposure for a PLIF Non segmental pedicle screw fixation L4/5(nuvasive relign) SURGEON:  Surgeon(s): Ashok Pall, MD Newman Pies, MD  ASSISTANTS:Jekins, Dellis Filbert  ANESTHESIA:   general  EBL:  Total I/O In: 750 [I.V.:500; IV Piggyback:250] Out: 150 [Urine:50; Blood:100]  BLOOD ADMINISTERED:none  CELL SAVER GIVEN:none  COUNT:per nursing  DRAINS: none   SPECIMEN:  No Specimen  DICTATION: Elizabeth Cohen is a 59 y.o. female whom was taken to the operating room intubated, and placed under a general anesthetic without difficulty. A foley catheter was placed under sterile conditions. She was positioned prone on a Jackson table with all pressure points properly padded.  Her lumbar region was prepped and draped in a sterile manner. I infiltrated 10cc's 1/2%lidocaine/1:2000,000 strength epinephrine into the planned incision. I opened the skin with a 10 blade and took the incision down to the thoracolumbar fascia. I exposed the lamina of L3,4,and 5 in a subperiosteal fashion bilaterally. I confirmed my location with an intraoperative xray.  I placed self retaining retractors and started the decompression.  I decompressed the spinal canal by performing a laminectomy of L4 and inferior facetectomies of L4 bilaterally. I used the drill and Kerrison punches to remove the bone and the hypertrophied ligamentum  flavum causing stenosis. I decompressed the lateral recesses and the neural foramina by removing the bone and facets, a partial superior facetectomy was also performed. PLIF's were performed at L4/5 in the same fashion. I opened the disc space with a 15 blade then used a variety of instruments to remove the disc and prepare the space for the arthrodesis. I used curettes, rongeurs, punches, shavers for the disc space, and rasps in the discetomy. I measured the disc space and placed 2 11 x56mm  titaniumacages(Synthes) into the disc space(s). The cages were packed with autograft morsels and the space around the cages in the disc space were also packed with autograft morsels.    We placed pedicle screws at L4, and L5, using fluoroscopic guidance. I drilled a pilot hole, then cannulated the pedicle with a drill at each site. We then tapped each pedicle, assessing each site for pedicle violations. No cutouts were appreciated. Screws (nuvasive relign) were then placed at each site without difficulty. I attached rods and locking caps with the appropriate tools. The locking caps were secured with torque limited screwdrivers. Final films were performed and the final construct appeared to be in good position.  We closed the wound in a layered fashion. We approximated the thoracolumbar fascia, subcutaneous, and subcuticular planes with vicryl sutures. I used a nylon suture for a sterile dressing. I did trim the skin edges with a 15 blade then closed the skin    PLAN OF CARE: Admit to inpatient   PATIENT DISPOSITION:  PACU - hemodynamically stable.   Delay start of Pharmacological VTE agent (>24hrs) due to surgical blood loss or risk of bleeding:  yes

## 2019-08-14 NOTE — Anesthesia Procedure Notes (Signed)
Procedure Name: Intubation Date/Time: 08/14/2019 3:56 PM Performed by: Kathryne Hitch, CRNA Pre-anesthesia Checklist: Patient identified, Emergency Drugs available, Suction available and Patient being monitored Patient Re-evaluated:Patient Re-evaluated prior to induction Oxygen Delivery Method: Circle system utilized Preoxygenation: Pre-oxygenation with 100% oxygen Induction Type: IV induction Ventilation: Mask ventilation without difficulty Laryngoscope Size: Miller and 3 Grade View: Grade I Tube type: Oral Tube size: 7.0 mm Number of attempts: 1 Airway Equipment and Method: Stylet and Oral airway Placement Confirmation: ETT inserted through vocal cords under direct vision,  positive ETCO2 and breath sounds checked- equal and bilateral Secured at: 21 cm Tube secured with: Tape Dental Injury: Teeth and Oropharynx as per pre-operative assessment

## 2019-08-14 NOTE — H&P (Addendum)
BP 129/71   Pulse (!) 55   Temp 97.9 F (36.6 C) (Temporal)   Resp 18   Ht 5\' 7"  (1.702 m)   Wt 97.2 kg   LMP  (LMP Unknown)   SpO2 100%   BMI 33.55 kg/m  Elizabeth Cohen returns today with a myelogram and post myelogram CT.  She also had a hip x-ray done, which showed some mild arthritis in the hips.  But she does have neurogenic claudication hurting whenever she has to stand or walk.  She is a Theme park manager.  MRI shows lumbar stenosis at 4-5, mainly due to ligamentous hypertrophy, mild disc bulge, but it is the ligaments that are the worst.  Now, she does have a slight bit of movement on flexion and extension, and she does have significant facet arthropathy. She was taken to the operating room for the decompression but her facets were grossly incompetent and I felt she needed to be fused. I aborted the procedure And spoke with her. I planned on a lumbar arthrodesis which she will be admitted for today.  Allergies  Allergen Reactions  . Celecoxib Hives  . Codeine Itching and Nausea And Vomiting  . Epinephrine Other (See Comments)    dizziness  . Gabapentin Other (See Comments)    Altered mental state  . Latex     Skin blisters  . Morphine Hives  . Naproxen Other (See Comments)    Jerrye Bushy  . Povidone-Iodine     Betadine cause skin blisters  . Tape Other (See Comments)    Surgical/ Blisters Bandage adhesive/ Blisters Band-aids/ Blisters  . Topamax [Topiramate]     Altered mental state  . Sulfonamide Derivatives Rash   Past Medical History:  Diagnosis Date  . Acid reflux   . Anxiety    OCD  . Bell's palsy 03/2019  . Cancer ([place])    Skin right arm and left ear, left breast  . Complication of anesthesia    slow to wake up during wisdom teeth ext, no other problems with the other surgery  . Depression   . 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 Hypertension   . Migraine   . Positive ANA (antinuclear antibody)    no problems,  no meds  ' Past Surgical History:  Procedure Laterality Date  . ABDOMINAL HYSTERECTOMY    . APPENDECTOMY  2002  . BACK SURGERY  07/25/2019   Laminectomy and poraminotomy lumbar @ [facility] [place]  . CARPAL TUNNEL RELEASE  2015  . CHOLECYSTECTOMY  2004  . COLONOSCOPY  10/04/2010   Procedure: COLONOSCOPY;  Surgeon: Daneil Dolin, MD;  Location: [facility];  Service: Endoscopy;  Laterality: N/A;  8:15AM  . EXTERNAL EAR SURGERY Left 01/2018   Skin cancer  . FOOT SURGERY Bilateral 2018  . SKIN CANCER EXCISION Right 09/20/2018   Right arm  . TENDON REPAIR    . ULNAR NERVE REPAIR    . UPPER GI ENDOSCOPY    . WISDOM TOOTH EXTRACTION      Prior to Admission medications   Medication Sig Start Date End Date Taking? Authorizing Provider  acetaminophen (TYLENOL) 500 MG tablet Take 1,000 mg by mouth every 6 (six) hours as needed for mild pain or moderate pain.   Yes [provider]  chlorthalidone (HYGROTON) 25 MG tablet Take 25 mg by mouth daily.  03/08/19  Yes [provider]  Cholecalciferol (VITAMIN D) 50 MCG (2000 UT) tablet Take 2,000 Units by mouth daily.    Yes [provider]  diphenhydrAMINE HCl (BENADRYL PO) Take by mouth daily as needed (to help with sleep).   Yes [provider]  DULoxetine (CYMBALTA) 30 MG capsule Take 30 mg by mouth 2 (two) times daily. 06/08/19  Yes [provider]  esomeprazole (NEXIUM) 20 MG capsule Take 20 mg by mouth daily.    Yes [provider]  fexofenadine (ALLEGRA) 180 MG tablet Take 180 mg by mouth daily.   Yes [provider]  fluticasone (FLONASE) 50 MCG/ACT nasal spray Place 1 spray into both nostrils daily.   Yes [provider]  metoprolol tartrate (LOPRESSOR) 25 MG tablet Take 25 mg by mouth 2 (two) times daily. 02/10/19  Yes [provider]  oxycodone (OXY-IR) 5 MG capsule Take 5 mg by mouth every 4 (four) hours as needed for pain.   Yes [provider]  rizatriptan (MAXALT) 10 MG tablet Take 10 mg by mouth daily as needed for migraine. May repeat in 2 hours if needed    Yes [provider]  tiZANidine (ZANAFLEX) 4 MG tablet Take 4 mg by mouth at bedtime.   Yes [provider]   Family History  Problem Relation Age of Onset  . Stroke Mother   . Hypertension Mother   . Heart disease Mother   . Atrial fibrillation Mother   . Hypertension Father    Social History   Socioeconomic History  . Marital status: Married    Spouse name: Not on file  . Number of children: 1  . Years of education: some college  . Highest education level: Not on file  Occupational History  . Not on file  Tobacco Use  . Smoking status: Former Smoker    Packs/day: 1.00    Years: 10.00    Pack years: 10.00    Types: Cigarettes    Quit date: 1998    Years since quitting: 23.5  . Smokeless tobacco: Never Used  Vaping Use  . Vaping Use: Never used  Substance and Sexual Activity  . Alcohol use: Never    Alcohol/week: 0.0 standard drinks  . Drug use: Never  . Sexual activity: Yes    Birth control/protection: Surgical    Comment: Hysterectomy  Other Topics Concern  . Not on file  Social History Narrative   Lives at home with her husband.   Right-handed.   No caffeine use.   Social Determinants of Health   Financial Resource Strain:   . Difficulty of Paying Living Expenses:   Food Insecurity:   . Worried About Charity fundraiser in the Last Year:   . Arboriculturist in the Last Year:   Transportation Needs:   . Film/video editor (Medical):   Marland Kitchen Lack of Transportation (Non-Medical):   Physical Activity:   . Days of Exercise per Week:   . Minutes of Exercise per Session:   Stress:   . Feeling of Stress :   Social Connections:   . Frequency of Communication with Friends and Family:   . Frequency of Social Gatherings with Friends and Family:   . Attends Religious Services:   . Active Member of Clubs or  Organizations:   . Attends Archivist Meetings:   Marland Kitchen Marital Status:   Intimate Partner Violence:   . Fear of Current or Ex-Partner:   . Emotionally Abused:   Marland Kitchen Physically  Abused:   . Sexually Abused:        She is 67 inches, weighs 211 pounds.  Blood pressure is 106/71, pulse is 83, temperature is 96.2.  She is alert, oriented by 4.  She answers all questions appropriately.  Memory, language, attention span, and fund of knowledge are normal.  Speech is clear, it is also fluent.  Hearing intact to voice.  Pupils equal, round, and reactive to light.  Full extraocular movements and full visual fields.  She has 5/5 strength in both the upper and lower extremities.  Romberg is negative.  3+ reflexes in the upper and lower extremities.  No clonus.  Mild Hoffmann sign.  Gait is otherwise normal.     Elizabeth Cohen would like to proceed with an arthrodesis at L4/5 due to gross instability identified at surgery.      I will see her in the operating room.  We went over risks and benefits, bleeding, infection, no relief, possible need for fusion in the future.  She understands and wishes to proceed.

## 2019-08-15 LAB — CREATININE, SERUM
Creatinine, Ser: 1.25 mg/dL — ABNORMAL HIGH (ref 0.44–1.00)
GFR calc Af Amer: 55 mL/min — ABNORMAL LOW (ref >60)
GFR calc non Af Amer: 47 mL/min — ABNORMAL LOW (ref >60)

## 2019-08-15 MED ORDER — HYDROCODONE-ACETAMINOPHEN 5-325 MG PO TABS: 1.0000 | ORAL_TABLET | Freq: Four times a day (QID) | ORAL | 0 refills | Status: AC | PRN

## 2019-08-15 MED ORDER — TIZANIDINE HCL 4 MG PO TABS
4.0000 mg | ORAL_TABLET | Freq: Four times a day (QID) | ORAL | 0 refills | Status: DC | PRN
Start: 2019-08-15 — End: 2019-12-17

## 2019-08-15 NOTE — Discharge Instructions (Signed)

## 2019-08-15 NOTE — Progress Notes (Signed)
Patient is discharged from room 3C10 at this time. Alert and in stable condition. IV site d/c'd and instructions read to patient and spouse with understanding verbalized and all questions answered. Left unit via wheelchair with all belongings at side. 

## 2019-08-15 NOTE — Evaluation (Signed)
Occupational Therapy Evaluation Patient Details Name: Elizabeth Cohen MRN: [id number] DOB: 08-11-1960 Today's Date: 08/15/2019    History of Present Illness 59 y.o. female presenting with lumbar stenosis at L4-5 s/p PLIF on 6/30 by Dr. Christella Noa. PMHx significant for; anxiety/depression, skin CA, and HTN.   Clinical Impression   Prior to admission, patient was living with her spouse in a private residence and was independent with all BADL/IADLs, and was working part-time as a Emergency planning/management officer. Prior to onset of pain 6-7 months ago, patient was working full-time. Patient currently presents at baseline level of function demonstrating independence to Mod-I with BADLs including dressing, toileting, and grooming standing at sink level after education on back precautions and compensatory techniques to maintain back precautions. Patient does not demonstrate need for continued acute OT services at this time. OT to sign off. No follow-up OT recommendations.     Follow Up Recommendations  No OT follow up    Equipment Recommendations  None recommended by OT    Recommendations for Other Services       Precautions / Restrictions Precautions Precautions: Back Precaution Booklet Issued: Yes (comment) Required Braces or Orthoses: Spinal Brace Spinal Brace: Lumbar corset Restrictions Weight Bearing Restrictions: No      Mobility Bed Mobility Overal bed mobility: Modified Independent             General bed mobility comments: With log rolling technique and use of bed rail.  Transfers Overall transfer level: Independent Equipment used: None             General transfer comment: Patient able to ambulate around hospital room independently without AD.     Balance Overall balance assessment: No apparent balance deficits (not formally assessed)                                         ADL either performed or assessed with clinical judgement   ADL Overall ADL's : Needs  assistance/impaired     Grooming: Standing;Independent;Wash/dry hands;Wash/dry face;Oral care Grooming Details (indicate cue type and reason): Use of compensatory technique to maintain back precautions.          Upper Body Dressing : Independent   Lower Body Dressing: Modified independent;Sit to/from stand Lower Body Dressing Details (indicate cue type and reason): Able to don LB clothing seated EOB with figure-4 position and good adherence to back precautions.  Toilet Transfer: Modified Programmer, applications Details (indicate cue type and reason): To standard commode with Mod I and good adherence to back precautions Toileting- Clothing Manipulation and Hygiene: Modified independent       Functional mobility during ADLs: Independent       Vision Baseline Vision/History: Wears glasses Wears Glasses: At all times Patient Visual Report: No change from baseline Vision Assessment?: No apparent visual deficits     Perception     Praxis      Pertinent Vitals/Pain Pain Assessment: No/denies pain (At rest and with activity. )     Hand Dominance     Extremity/Trunk Assessment Upper Extremity Assessment Upper Extremity Assessment: Overall WFL for tasks assessed   Lower Extremity Assessment Lower Extremity Assessment: Defer to PT evaluation   Cervical / Trunk Assessment Cervical / Trunk Assessment: Other exceptions (s/p lumbar sx. )   Communication Communication Communication: No difficulties   Cognition Arousal/Alertness: Awake/alert Behavior During Therapy: WFL for tasks assessed/performed Overall Cognitive Status: Within Functional Limits  for tasks assessed                                     General Comments  Clean dry dressing at low back.     Exercises     Shoulder Instructions      Home Living Family/patient expects to be discharged to:: Private residence Living Arrangements: Spouse/significant other Available Help at Discharge:  Available 24 hours/day;Family Type of Home: Other(Comment) (Mobile home) Home Access: Stairs to enter Entrance Stairs-Number of Steps: 5 STE +1 Entrance Stairs-Rails: Can reach both Home Layout: One level     Bathroom Shower/Tub: Teacher, early years/pre: Standard     Home Equipment: None          Prior Functioning/Environment Level of Independence: Independent        Comments: BADLs/IADLs. 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 Currenlty part-time 2/2 pain prior to sx.         OT Problem List:        OT Treatment/Interventions:      OT Goals(Current goals can be found in the care plan section) Acute Rehab OT Goals Patient Stated Goal: To return home.  OT Frequency:     Barriers to D/C:            Co-evaluation              AM-PAC OT "6 Clicks" Daily Activity     Outcome Measure Help from another person eating meals?: None Help from another person taking care of personal grooming?: None Help from another person toileting, which includes using toliet, bedpan, or urinal?: None Help from another person bathing (including washing, rinsing, drying)?: None Help from another person to put on and taking off regular upper body clothing?: None Help from another person to put on and taking off regular lower body clothing?: None 6 Click Score: 24   End of Session    Activity Tolerance: Patient tolerated treatment well Patient left: in chair;with call bell/phone within reach  OT Visit Diagnosis: Unsteadiness on feet (R26.81);Pain                Time: 0981-1914 OT Time Calculation (min): 29 min Charges:  OT General Charges $OT Visit: 1 Visit OT Evaluation $OT Eval Low Complexity: 1 Low OT Treatments $Self Care/Home Management : 8-22 mins  Gola Bribiesca H. OTR/L Supplemental OT, Department of rehab services [phone]  Davi Rotan R H. 08/15/2019, 11:03 AM

## 2019-08-15 NOTE — Evaluation (Signed)
Physical Therapy Evaluation and Discharge Patient Details Name: Elizabeth Cohen MRN: [id number] DOB: 09-30-1960 Today's Date: 08/15/2019   History of Present Illness  59 y.o. female presenting with lumbar stenosis at L4-5 s/p PLIF on 6/30 by Dr. Christella Noa. PMHx significant for; anxiety/depression, skin CA, and HTN.  Clinical Impression  Patient evaluated by Physical Therapy with no further acute PT needs identified. All education has been completed and the patient has no further questions. Pt was able to demonstrate transfers and ambulation with gross modified independence and no AD. Pt was educated on precautions, brace application/wearing schedule, appropriate activity progression, and car transfer. See below for any follow-up Physical Therapy or equipment needs. PT is signing off. Thank you for this referral.     Follow Up Recommendations No PT follow up;Supervision - Intermittent    Equipment Recommendations  None recommended by PT    Recommendations for Other Services       Precautions / Restrictions Precautions Precautions: Back Precaution Booklet Issued: Yes (comment) Required Braces or Orthoses: Spinal Brace Spinal Brace: Lumbar corset;Applied in sitting position Restrictions Weight Bearing Restrictions: No      Mobility  Bed Mobility               General bed mobility comments: Pt was received sitting up in the recliner.   Transfers Overall transfer level: Independent Equipment used: None             General transfer comment: No assist. Pt demonstrated proper hand placement on seated surface for safety.   Ambulation/Gait Ambulation/Gait assistance: Modified independent (Device/Increase time) Gait Distance (Feet): 400 Feet Assistive device: None Gait Pattern/deviations: WFL(Within Functional Limits) Gait velocity: Decreased Gait velocity interpretation: 1.31 - 2.62 ft/sec, indicative of limited community ambulator General Gait Details: Slow but steady.  No obvious gait deviations  Stairs Stairs: Yes Stairs assistance: Supervision;Modified independent (Device/Increase time) Stair Management: One rail Right;Alternating pattern;Forwards Number of Stairs: 10 General stair comments: Pt easily negotiating stairs. Audible creaking in her knees but pt reports it is not painful.   Wheelchair Mobility    Modified Rankin (Stroke Patients Only)       Balance Overall balance assessment: No apparent balance deficits (not formally assessed)                                           Pertinent Vitals/Pain Pain Assessment: No/denies pain    Home Living Family/patient expects to be discharged to:: Private residence Living Arrangements: Spouse/significant other Available Help at Discharge: Available 24 hours/day;Family Type of Home: Mobile home Home Access: Stairs to enter Entrance Stairs-Rails: Can reach both Entrance Stairs-Number of Steps: 5 STE +1 Home Layout: One level Home Equipment: None      Prior Function Level of Independence: Independent         Comments: BADLs/IADLs. 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      Hand Dominance        Extremity/Trunk Assessment   Upper Extremity Assessment Upper Extremity Assessment: Overall WFL for tasks assessed    Lower Extremity Assessment Lower Extremity Assessment: Generalized weakness (baseline knee joint arthritis (audible creaking with stairs))    Cervical / Trunk Assessment Cervical / Trunk Assessment: Other exceptions (s/p lumbar sx. )  Communication   Communication: No difficulties  Cognition Arousal/Alertness: Awake/alert Behavior During Therapy: WFL for tasks assessed/performed Overall  Cognitive Status: Within Functional Limits for tasks assessed                                        General Comments      Exercises     Assessment/Plan    PT Assessment Patent  does not need any further PT services  PT Problem List         PT Treatment Interventions      PT Goals (Current goals can be found in the Care Plan section)  Acute Rehab PT Goals Patient Stated Goal: To return home. PT Goal Formulation: All assessment and education complete, DC therapy    Frequency     Barriers to discharge        Co-evaluation               AM-PAC PT "6 Clicks" Mobility  Outcome Measure Help needed turning from your back to your side while in a flat bed without using bedrails?: None Help needed moving from lying on your back to sitting on the side of a flat bed without using bedrails?: None Help needed moving to and from a bed to a chair (including a wheelchair)?: None Help needed standing up from a chair using your arms (e.g., wheelchair or bedside chair)?: None Help needed to walk in hospital room?: None Help needed climbing 3-5 steps with a railing? : None 6 Click Score: 24    End of Session Equipment Utilized During Treatment: Gait belt;Back brace Activity Tolerance: Patient tolerated treatment well Patient left: in chair;with call bell/phone within reach;with family/visitor present Nurse Communication: Mobility status PT Visit Diagnosis: Unsteadiness on feet (R26.81);Pain Pain - part of body:  (back)    Time: 3143-8887 PT Time Calculation (min) (ACUTE ONLY): 22 min   Charges:   PT Evaluation $PT Eval Low Complexity: 1 Low          Rolinda Roan, PT, DPT Acute Rehabilitation Services Pager: [phone] Office: [phone]   Thelma Comp 08/15/2019, 2:15 PM

## 2019-08-15 NOTE — Progress Notes (Signed)
Orthopedic Tech Progress Note Patient Details:  [name] 12-09-1960 [id number] RN said patient has back brace.  Patient ID: Elizabeth Cohen, female   DOB: 18-Jun-1960, 59 y.o.   MRN: [id number]   Chip Boer 08/15/2019, 8:26 AM

## 2019-08-16 NOTE — Discharge Summary (Signed)
Physician Discharge Summary  Patient ID: Elizabeth Cohen MRN: [id number] DOB/AGE: [birth year] 59 y.o.  Admit date: 08/14/2019 Discharge date: 08/16/2019  Admission Diagnoses:lumbar stenosis L4/5, lumbar instability  Discharge Diagnoses: lumbar stenosis L4/5, lumbar instability Active Problems:   Lumbar stenosis with neurogenic claudication   S/P lumbar fusion   Discharged Condition: good  Hospital Course: Elizabeth Cohen was initially taken to the operating room 2 weeks ago where I found gross instability in her L4/5 facet. I had planned on a simple decompression for stenosis, but aborted the procedure due to these findings. She was brought to the hospital for a lumbar decompression and arthrodesis at L4/5. Post op she has done very well. Her wound at discharge in clean, dry, and without signs of infection. She is ambulating, voiding, and tolerating a regular diet.   Treatments: surgery: Lumbar four-five posterior lumbar interbody fusion, Synthes titanium cages 11 x74mm filled with autograft morsels Laminectomy L4 beyond the needed exposure for a PLIF Non segmental pedicle screw fixation L4/5(nuvasive relign)  Discharge Exam: Blood pressure (!) 102/55, pulse 70, temperature 98.2 F (36.8 C), temperature source Oral, resp. rate 16, height 5\' 7"  (1.702 m), weight 97.2 kg, SpO2 97 %. General appearance: alert, cooperative and no distress  Disposition: Discharge disposition: 01-Home or Self Care      Lumbar stenosis Instability Discharge Instructions    Incentive spirometry RT   Complete by: As directed      Allergies as of 08/15/2019      Reactions   Celecoxib Hives   Codeine Itching, Nausea And Vomiting   Epinephrine Other (See Comments)   dizziness   Gabapentin Other (See Comments)   Altered mental state   Latex    Skin blisters   Morphine Hives   Naproxen Other (See Comments)   Gerd   Povidone-iodine    Betadine cause skin blisters   Tape Other (See Comments)    Surgical/ Blisters Bandage adhesive/ Blisters Band-aids/ Blisters   Topamax [topiramate]    Altered mental state   Sulfonamide Derivatives Rash      Medication List    STOP taking these medications   oxycodone 5 MG capsule Commonly known as: OXY-IR     TAKE these medications   acetaminophen 500 MG tablet Commonly known as: TYLENOL Take 1,000 mg by mouth every 6 (six) hours as needed for mild pain or moderate pain.   BENADRYL PO Take by mouth daily as needed (to help with sleep).   chlorthalidone 25 MG tablet Commonly known as: HYGROTON Take 25 mg by mouth daily.   DULoxetine 30 MG capsule Commonly known as: CYMBALTA Take 30 mg by mouth 2 (two) times daily.   esomeprazole 20 MG capsule Commonly known as: NEXIUM Take 20 mg by mouth daily.   fexofenadine 180 MG tablet Commonly known as: ALLEGRA Take 180 mg by mouth daily.   fluticasone 50 MCG/ACT nasal spray Commonly known as: FLONASE Place 1 spray into both nostrils daily.   HYDROcodone-acetaminophen 5-325 MG tablet Commonly known as: NORCO/VICODIN Take 1 tablet by mouth every 6 (six) hours as needed for up to 7 days for moderate pain.   metoprolol tartrate 25 MG tablet Commonly known as: LOPRESSOR Take 25 mg by mouth 2 (two) times daily.   rizatriptan 10 MG tablet Commonly known as: MAXALT Take 10 mg by mouth daily as needed for migraine. May repeat in 2 hours if needed   Vitamin D 50 MCG (2000 UT) tablet Take 2,000 Units by mouth daily.  Zanaflex 4 MG tablet Generic drug: tiZANidine Take 4 mg by mouth at bedtime. What changed: Another medication with the same name was added. Make sure you understand how and when to take each.   tiZANidine 4 MG tablet Commonly known as: ZANAFLEX Take 1 tablet (4 mg total) by mouth every 6 (six) hours as needed for muscle spasms. What changed: You were already taking a medication with the same name, and this prescription was added. Make sure you understand how and when  to take each.       Follow-up Information    Ashok Pall, MD Follow up in 1 week(s).   Specialty: Neurosurgery Why: please call the office to make an appointment to have sutures removed Contact information: [address] [place] [address] [phone]               Signed: Ashok Pall 08/16/2019, 1:30 PM

## 2019-10-07 ENCOUNTER — Encounter: Payer: Self-pay | Admitting: Internal Medicine

## 2019-10-15 ENCOUNTER — Encounter: Payer: Self-pay | Admitting: Internal Medicine

## 2019-12-02 ENCOUNTER — Ambulatory Visit: Payer: BC Managed Care – PPO | Admitting: Nurse Practitioner

## 2019-12-17 ENCOUNTER — Other Ambulatory Visit: Payer: Self-pay

## 2019-12-17 ENCOUNTER — Encounter: Payer: Self-pay | Admitting: Internal Medicine

## 2019-12-17 ENCOUNTER — Ambulatory Visit (INDEPENDENT_AMBULATORY_CARE_PROVIDER_SITE_OTHER): Payer: BC Managed Care – PPO | Admitting: Internal Medicine

## 2019-12-17 VITALS — BP 120/81 | HR 60 | Temp 97.1°F | Ht 67.5 in | Wt 207.8 lb

## 2019-12-17 DIAGNOSIS — K219 Gastro-esophageal reflux disease without esophagitis: Secondary | ICD-10-CM | POA: Diagnosis not present

## 2019-12-17 NOTE — Progress Notes (Signed)
Primary Care Physician:  Dara Lords, NP Primary Gastroenterologist:  Dr. Gala Romney  Pre-Procedure History & Physical: HPI:  Elizabeth Cohen is a 59 y.o. female here for further evaluation of a low ferritin and refractory reflux. Symptoms.  Seen by PCP recently.  Ferritin low at 11.  Serum iron low at 29; I did not see an iron binding capacity.  Hemoglobin slightly depressed at 10.6 MCV 86.  LFTs normal except for a slightly elevated alkaline phosphatase of 135. Patient has a long history of GERD.  Historically, well controlled on Nexium 40 mg daily.  However, she notes over the last year refractory reflux symptoms and associated nausea but no vomiting.  Has lots of gas/belching.  Waterbrash.  No dysphagia. She notes an insidious 60 pound weight gain over the past 2 to 3 years.  She cites recent foot and back surgery producing transient immobility as the explanation behind the majority of her weight gain.  She reports also alternating constipation and diarrhea.  Predominantly diarrhea 10 years ago for which she underwent a colonoscopy.  Biopsies negative for microscopic colitis.  Serological screen for celiac disease also negative at that time.  Past Medical History:  Diagnosis Date  . Acid reflux   . Anxiety    OCD  . Bell's palsy 03/2019  . Cancer ([place])    Skin right arm and left ear, left breast  . Complication of anesthesia    slow to wake up during wisdom teeth ext, no other problems with the other surgery  . Depression   . 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 Hypertension   . Migraine   . Positive ANA (antinuclear antibody)    no problems, no meds    Past Surgical History:  Procedure Laterality Date  . ABDOMINAL HYSTERECTOMY    . APPENDECTOMY  2002  . BACK SURGERY  07/25/2019   Laminectomy and poraminotomy lumbar @ [facility] [place]  . CARPAL TUNNEL RELEASE  2015  . CHOLECYSTECTOMY  2004  .  COLONOSCOPY  10/04/2010   Procedure: COLONOSCOPY;  Surgeon: Daneil Dolin, MD;  [facility];  Service: Endoscopy;  Laterality: N/A;  8:15AM  . EXTERNAL EAR SURGERY Left 01/2018   Skin cancer  . FOOT SURGERY Bilateral 2018  . SKIN CANCER EXCISION Right 09/20/2018   Right arm  . TENDON REPAIR    . ULNAR NERVE REPAIR    . UPPER GI ENDOSCOPY    . WISDOM TOOTH EXTRACTION      Prior to Admission medications   Medication Sig Start Date End Date Taking? Authorizing Provider  acetaminophen (TYLENOL) 500 MG tablet Take 1,000 mg by mouth every 6 (six) hours as needed for mild pain or moderate pain.   Yes [provider]  Calcium Carbonate Antacid (TUMS PO) Take 2 tablets by mouth as needed.   Yes [provider]  chlorthalidone (HYGROTON) 25 MG tablet Take 25 mg by mouth daily.  03/08/19  Yes [provider]  diphenhydrAMINE HCl (BENADRYL PO) Take by mouth daily as needed (to help with sleep).   Yes [provider]  DULoxetine (CYMBALTA) 30 MG capsule Take 30 mg by mouth 2 (two) times daily. 06/08/19  Yes [provider]  esomeprazole (NEXIUM) 20 MG capsule Take 20 mg by mouth daily.    Yes [provider]  fexofenadine (ALLEGRA) 180 MG tablet Take  180 mg by mouth daily.   Yes [provider]  fluticasone (FLONASE) 50 MCG/ACT nasal spray Place 1 spray into both nostrils daily.   Yes [provider]  metoprolol tartrate (LOPRESSOR) 25 MG tablet Take 25 mg by mouth 2 (two) times daily. 02/10/19  Yes [provider]  rizatriptan (MAXALT) 10 MG tablet Take 10 mg by mouth daily as needed for migraine. May repeat in 2 hours if needed    Yes [provider]    Allergies as of 12/17/2019 - Review Complete 12/17/2019  Allergen Reaction Noted  . Celecoxib Hives   . Codeine Itching and Nausea And Vomiting   . Epinephrine Other (See Comments)   . Gabapentin Other (See Comments) 07/10/2019  . Latex    .  Morphine Hives   . Naproxen Other (See Comments)   . Povidone-iodine    . Tape Other (See Comments)   . Topamax [topiramate]  07/10/2019  . Sulfonamide derivatives Rash     Family History  Problem Relation Age of Onset  . Stroke Mother   . Hypertension Mother   . Heart disease Mother   . Atrial fibrillation Mother   . Hypertension Father     Social History   Socioeconomic History  . Marital status: Married    Spouse name: Not on file  . Number of children: 1  . Years of education: some college  . Highest education level: Not on file  Occupational History  . Not on file  Tobacco Use  . Smoking status: Former Smoker    Packs/day: 1.00    Years: 10.00    Pack years: 10.00    Types: Cigarettes    Quit date: 1998    Years since quitting: 23.8  . Smokeless tobacco: Never Used  Vaping Use  . Vaping Use: Never used  Substance and Sexual Activity  . Alcohol use: Never    Alcohol/week: 0.0 standard drinks  . Drug use: Never  . Sexual activity: Yes    Birth control/protection: Surgical    Comment: Hysterectomy  Other Topics Concern  . Not on file  Social History Narrative   Lives at home with her husband.   Right-handed.   No caffeine use.   Social Determinants of Health   Financial Resource Strain:   . Difficulty of Paying Living Expenses: Not on file  Food Insecurity:   . Worried About Charity fundraiser in the Last Year: Not on file  . Ran Out of Food in the Last Year: Not on file  Transportation Needs:   . Lack of Transportation (Medical): Not on file  . Lack of Transportation (Non-Medical): Not on file  Physical Activity:   . Days of Exercise per Week: Not on file  . Minutes of Exercise per Session: Not on file  Stress:   . Feeling of Stress : Not on file  Social Connections:   . Frequency of Communication with Friends and Family: Not on file  . Frequency of Social Gatherings with Friends and Family: Not on file  . Attends Religious Services: Not on  file  . Active Member of Clubs or Organizations: Not on file  . Attends Archivist Meetings: Not on file  . Marital Status: Not on file  Intimate Partner Violence:   . Fear of Current or Ex-Partner: Not on file  . Emotionally Abused: Not on file  . Physically Abused: Not on file  . Sexually Abused: Not on file    Review  of Systems: See HPI, otherwise negative ROS  Physical Exam: BP 120/81   Pulse 60   Temp (!) 97.1 F (36.2 C) (Temporal)   Ht 5' 7.5" (1.715 m)   Wt 207 lb 12.8 oz (94.3 kg)   LMP  (LMP Unknown)   BMI 32.07 kg/m  General:   Alert,  pleasant and cooperative in NAD Abdomen: Non-distended, normal bowel sounds.  Soft and nontender without appreciable mass or hepatosplenomegaly.  Pulses:  Normal pulses noted. Extremities:  Without clubbing or edema. Rectal: Deferred until the time of colonoscopy   Impression/Plan: 59 year old lady with a low serum ferritin and iron in the setting of refractory GERD and alternating constipation and diarrhea  -the latter symptoms most consistent with irritable bowel syndrome.  Her recalcitrant reflux symptoms likely due, to a significant degree,  To a 60 pound weight gain over the past couple of years.  She has never had an EGD.  Chronically altered bowel habits over the years.  Prior evaluation negative for microscopic colitis and celiac disease.  It has been nearly 10 years since she had a colonoscopy. Her refractory reflux symptoms need further evaluation.  Minimally elevated alkaline phosphatase-nonspecific.  Clinically, she has had no GI bleeding.  Recommendations:  I have offered the patient both a diagnostic EGD and colonoscopy for refractory GERD and iron deficiency anemia.  Propofol.  ASA 2  The risks, benefits, limitations, imponderables and alternatives regarding both EGD and colonoscopy have been reviewed with the patient. Questions have been answered. All parties agreeable.   We will repeat a hepatic  function profile in the near future.  Notice: This dictation was prepared with Dragon dictation along with smaller phrase technology. Any transcriptional errors that result from this process are unintentional and may not be corrected upon review.

## 2019-12-17 NOTE — H&P (View-Only) (Signed)
Primary Care Physician:  Dara Lords, NP Primary Gastroenterologist:  Dr. Gala Romney  Pre-Procedure History & Physical: HPI:  Elizabeth Cohen is a 59 y.o. female here for further evaluation of a low ferritin and refractory reflux. Symptoms.  Seen by PCP recently.  Ferritin low at 11.  Serum iron low at 29; I did not see an iron binding capacity.  Hemoglobin slightly depressed at 10.6 MCV 86.  LFTs normal except for a slightly elevated alkaline phosphatase of 135. Patient has a long history of GERD.  Historically, well controlled on Nexium 40 mg daily.  However, she notes over the last year refractory reflux symptoms and associated nausea but no vomiting.  Has lots of gas/belching.  Waterbrash.  No dysphagia. She notes an insidious 60 pound weight gain over the past 2 to 3 years.  She cites recent foot and back surgery producing transient immobility as the explanation behind the majority of her weight gain.  She reports also alternating constipation and diarrhea.  Predominantly diarrhea 10 years ago for which she underwent a colonoscopy.  Biopsies negative for microscopic colitis.  Serological screen for celiac disease also negative at that time.  Past Medical History:  Diagnosis Date  . Acid reflux   . Anxiety    OCD  . Bell's palsy 03/2019  . Cancer ([place])    Skin right arm and left ear, left breast  . Complication of anesthesia    slow to wake up during wisdom teeth ext, no other problems with the other surgery  . Depression   . 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 Hypertension   . Migraine   . Positive ANA (antinuclear antibody)    no problems, no meds    Past Surgical History:  Procedure Laterality Date  . ABDOMINAL HYSTERECTOMY    . APPENDECTOMY  2002  . BACK SURGERY  07/25/2019   Laminectomy and poraminotomy lumbar @ [facility] [place]  . CARPAL TUNNEL RELEASE  2015  . CHOLECYSTECTOMY  2004  .  COLONOSCOPY  10/04/2010   Procedure: COLONOSCOPY;  Surgeon: Daneil Dolin, MD;  [facility];  Service: Endoscopy;  Laterality: N/A;  8:15AM  . EXTERNAL EAR SURGERY Left 01/2018   Skin cancer  . FOOT SURGERY Bilateral 2018  . SKIN CANCER EXCISION Right 09/20/2018   Right arm  . TENDON REPAIR    . ULNAR NERVE REPAIR    . UPPER GI ENDOSCOPY    . WISDOM TOOTH EXTRACTION      Prior to Admission medications   Medication Sig Start Date End Date Taking? Authorizing Provider  acetaminophen (TYLENOL) 500 MG tablet Take 1,000 mg by mouth every 6 (six) hours as needed for mild pain or moderate pain.   Yes [provider]  Calcium Carbonate Antacid (TUMS PO) Take 2 tablets by mouth as needed.   Yes [provider]  chlorthalidone (HYGROTON) 25 MG tablet Take 25 mg by mouth daily.  03/08/19  Yes [provider]  diphenhydrAMINE HCl (BENADRYL PO) Take by mouth daily as needed (to help with sleep).   Yes [provider]  DULoxetine (CYMBALTA) 30 MG capsule Take 30 mg by mouth 2 (two) times daily. 06/08/19  Yes [provider]  esomeprazole (NEXIUM) 20 MG capsule Take 20 mg by mouth daily.    Yes [provider]  fexofenadine (ALLEGRA) 180 MG tablet Take  180 mg by mouth daily.   Yes [provider]  fluticasone (FLONASE) 50 MCG/ACT nasal spray Place 1 spray into both nostrils daily.   Yes [provider]  metoprolol tartrate (LOPRESSOR) 25 MG tablet Take 25 mg by mouth 2 (two) times daily. 02/10/19  Yes [provider]  rizatriptan (MAXALT) 10 MG tablet Take 10 mg by mouth daily as needed for migraine. May repeat in 2 hours if needed    Yes [provider]    Allergies as of 12/17/2019 - Review Complete 12/17/2019  Allergen Reaction Noted  . Celecoxib Hives   . Codeine Itching and Nausea And Vomiting   . Epinephrine Other (See Comments)   . Gabapentin Other (See Comments) 07/10/2019  . Latex    .  Morphine Hives   . Naproxen Other (See Comments)   . Povidone-iodine    . Tape Other (See Comments)   . Topamax [topiramate]  07/10/2019  . Sulfonamide derivatives Rash     Family History  Problem Relation Age of Onset  . Stroke Mother   . Hypertension Mother   . Heart disease Mother   . Atrial fibrillation Mother   . Hypertension Father     Social History   Socioeconomic History  . Marital status: Married    Spouse name: Not on file  . Number of children: 1  . Years of education: some college  . Highest education level: Not on file  Occupational History  . Not on file  Tobacco Use  . Smoking status: Former Smoker    Packs/day: 1.00    Years: 10.00    Pack years: 10.00    Types: Cigarettes    Quit date: 1998    Years since quitting: 23.8  . Smokeless tobacco: Never Used  Vaping Use  . Vaping Use: Never used  Substance and Sexual Activity  . Alcohol use: Never    Alcohol/week: 0.0 standard drinks  . Drug use: Never  . Sexual activity: Yes    Birth control/protection: Surgical    Comment: Hysterectomy  Other Topics Concern  . Not on file  Social History Narrative   Lives at home with her husband.   Right-handed.   No caffeine use.   Social Determinants of Health   Financial Resource Strain:   . Difficulty of Paying Living Expenses: Not on file  Food Insecurity:   . Worried About Charity fundraiser in the Last Year: Not on file  . Ran Out of Food in the Last Year: Not on file  Transportation Needs:   . Lack of Transportation (Medical): Not on file  . Lack of Transportation (Non-Medical): Not on file  Physical Activity:   . Days of Exercise per Week: Not on file  . Minutes of Exercise per Session: Not on file  Stress:   . Feeling of Stress : Not on file  Social Connections:   . Frequency of Communication with Friends and Family: Not on file  . Frequency of Social Gatherings with Friends and Family: Not on file  . Attends Religious Services: Not on  file  . Active Member of Clubs or Organizations: Not on file  . Attends Archivist Meetings: Not on file  . Marital Status: Not on file  Intimate Partner Violence:   . Fear of Current or Ex-Partner: Not on file  . Emotionally Abused: Not on file  . Physically Abused: Not on file  . Sexually Abused: Not on file    Review  of Systems: See HPI, otherwise negative ROS  Physical Exam: BP 120/81   Pulse 60   Temp (!) 97.1 F (36.2 C) (Temporal)   Ht 5' 7.5" (1.715 m)   Wt 207 lb 12.8 oz (94.3 kg)   LMP  (LMP Unknown)   BMI 32.07 kg/m  General:   Alert,  pleasant and cooperative in NAD Abdomen: Non-distended, normal bowel sounds.  Soft and nontender without appreciable mass or hepatosplenomegaly.  Pulses:  Normal pulses noted. Extremities:  Without clubbing or edema. Rectal: Deferred until the time of colonoscopy   Impression/Plan: 59 year old lady with a low serum ferritin and iron in the setting of refractory GERD and alternating constipation and diarrhea  -the latter symptoms most consistent with irritable bowel syndrome.  Her recalcitrant reflux symptoms likely due, to a significant degree,  To a 60 pound weight gain over the past couple of years.  She has never had an EGD.  Chronically altered bowel habits over the years.  Prior evaluation negative for microscopic colitis and celiac disease.  It has been nearly 10 years since she had a colonoscopy. Her refractory reflux symptoms need further evaluation.  Minimally elevated alkaline phosphatase-nonspecific.  Clinically, she has had no GI bleeding.  Recommendations:  I have offered the patient both a diagnostic EGD and colonoscopy for refractory GERD and iron deficiency anemia.  Propofol.  ASA 2  The risks, benefits, limitations, imponderables and alternatives regarding both EGD and colonoscopy have been reviewed with the patient. Questions have been answered. All parties agreeable.   We will repeat a hepatic  function profile in the near future.  Notice: This dictation was prepared with Dragon dictation along with smaller phrase technology. Any transcriptional errors that result from this process are unintentional and may not be corrected upon review.

## 2019-12-17 NOTE — Patient Instructions (Signed)
Schedule an EGD and Colonoscopy (refractory GERD and iron deficiency anemia )  ASA 2 propofol  Further recommendations to follow

## 2019-12-18 ENCOUNTER — Ambulatory Visit: Payer: BC Managed Care – PPO | Admitting: Nurse Practitioner

## 2019-12-24 ENCOUNTER — Other Ambulatory Visit: Payer: Self-pay

## 2019-12-24 ENCOUNTER — Other Ambulatory Visit (HOSPITAL_COMMUNITY)
Admission: RE | Admit: 2019-12-24 | Discharge: 2019-12-24 | Disposition: A | Discharge status: Home / Self Care | Payer: BC Managed Care – PPO | Source: Ambulatory Visit | Attending: Internal Medicine | Admitting: Internal Medicine

## 2019-12-24 DIAGNOSIS — Z20822 Contact with and (suspected) exposure to covid-19: Secondary | ICD-10-CM | POA: Insufficient documentation

## 2019-12-24 DIAGNOSIS — K317 Polyp of stomach and duodenum: Secondary | ICD-10-CM | POA: Diagnosis not present

## 2019-12-24 DIAGNOSIS — Z79899 Other long term (current) drug therapy: Secondary | ICD-10-CM | POA: Diagnosis not present

## 2019-12-24 DIAGNOSIS — Z01812 Encounter for preprocedural laboratory examination: Secondary | ICD-10-CM | POA: Insufficient documentation

## 2019-12-24 DIAGNOSIS — D509 Iron deficiency anemia, unspecified: Secondary | ICD-10-CM | POA: Diagnosis present

## 2019-12-24 DIAGNOSIS — Z886 Allergy status to analgesic agent status: Secondary | ICD-10-CM | POA: Diagnosis not present

## 2019-12-24 DIAGNOSIS — K640 First degree hemorrhoids: Secondary | ICD-10-CM | POA: Diagnosis not present

## 2019-12-24 DIAGNOSIS — K449 Diaphragmatic hernia without obstruction or gangrene: Secondary | ICD-10-CM | POA: Diagnosis not present

## 2019-12-24 DIAGNOSIS — Z888 Allergy status to other drugs, medicaments and biological substances status: Secondary | ICD-10-CM | POA: Diagnosis not present

## 2019-12-24 DIAGNOSIS — Z882 Allergy status to sulfonamides status: Secondary | ICD-10-CM | POA: Diagnosis not present

## 2019-12-24 DIAGNOSIS — K219 Gastro-esophageal reflux disease without esophagitis: Secondary | ICD-10-CM | POA: Diagnosis not present

## 2019-12-24 DIAGNOSIS — Z87891 Personal history of nicotine dependence: Secondary | ICD-10-CM | POA: Diagnosis not present

## 2019-12-24 DIAGNOSIS — Z1211 Encounter for screening for malignant neoplasm of colon: Secondary | ICD-10-CM | POA: Diagnosis not present

## 2019-12-24 DIAGNOSIS — Z885 Allergy status to narcotic agent status: Secondary | ICD-10-CM | POA: Diagnosis not present

## 2019-12-24 DIAGNOSIS — D123 Benign neoplasm of transverse colon: Secondary | ICD-10-CM | POA: Diagnosis not present

## 2019-12-24 DIAGNOSIS — Z91040 Latex allergy status: Secondary | ICD-10-CM | POA: Diagnosis not present

## 2019-12-24 LAB — BASIC METABOLIC PANEL
Anion gap: 9 (ref 5–15)
BUN: 19 mg/dL (ref 6–20)
CO2: 27 mmol/L (ref 22–32)
Calcium: 8.9 mg/dL (ref 8.9–10.3)
Chloride: 101 mmol/L (ref 98–111)
Creatinine, Ser: 0.93 mg/dL (ref 0.44–1.00)
GFR, Estimated: >60 mL/min (ref >60)
Glucose, Bld: 103 mg/dL — ABNORMAL HIGH (ref 70–99)
Potassium: 3 mmol/L — ABNORMAL LOW (ref 3.5–5.1)
Sodium: 137 mmol/L (ref 135–145)

## 2019-12-24 LAB — SARS CORONAVIRUS 2 (TAT 6-24 HRS): SARS Coronavirus 2: NEGATIVE

## 2019-12-26 ENCOUNTER — Ambulatory Visit (HOSPITAL_COMMUNITY): Payer: BC Managed Care – PPO | Admitting: Anesthesiology

## 2019-12-26 ENCOUNTER — Encounter (HOSPITAL_COMMUNITY)
Admission: RE | Disposition: A | Discharge status: Home / Self Care | Payer: Self-pay | Source: Home / Self Care | Attending: Internal Medicine

## 2019-12-26 ENCOUNTER — Ambulatory Visit (HOSPITAL_COMMUNITY)
Admission: RE | Admit: 2019-12-26 | Discharge: 2019-12-26 | Disposition: A | Discharge status: Home / Self Care | Payer: BC Managed Care – PPO | Attending: Internal Medicine | Admitting: Internal Medicine

## 2019-12-26 ENCOUNTER — Other Ambulatory Visit: Payer: Self-pay

## 2019-12-26 ENCOUNTER — Encounter (HOSPITAL_COMMUNITY): Payer: Self-pay | Admitting: Internal Medicine

## 2019-12-26 DIAGNOSIS — K317 Polyp of stomach and duodenum: Secondary | ICD-10-CM | POA: Insufficient documentation

## 2019-12-26 DIAGNOSIS — Z87891 Personal history of nicotine dependence: Secondary | ICD-10-CM | POA: Insufficient documentation

## 2019-12-26 DIAGNOSIS — K219 Gastro-esophageal reflux disease without esophagitis: Secondary | ICD-10-CM | POA: Insufficient documentation

## 2019-12-26 DIAGNOSIS — K635 Polyp of colon: Secondary | ICD-10-CM | POA: Diagnosis not present

## 2019-12-26 DIAGNOSIS — D509 Iron deficiency anemia, unspecified: Secondary | ICD-10-CM | POA: Diagnosis not present

## 2019-12-26 DIAGNOSIS — Z20822 Contact with and (suspected) exposure to covid-19: Secondary | ICD-10-CM | POA: Insufficient documentation

## 2019-12-26 DIAGNOSIS — Z885 Allergy status to narcotic agent status: Secondary | ICD-10-CM | POA: Insufficient documentation

## 2019-12-26 DIAGNOSIS — Z886 Allergy status to analgesic agent status: Secondary | ICD-10-CM | POA: Insufficient documentation

## 2019-12-26 DIAGNOSIS — Z79899 Other long term (current) drug therapy: Secondary | ICD-10-CM | POA: Insufficient documentation

## 2019-12-26 DIAGNOSIS — Z882 Allergy status to sulfonamides status: Secondary | ICD-10-CM | POA: Insufficient documentation

## 2019-12-26 DIAGNOSIS — Z1211 Encounter for screening for malignant neoplasm of colon: Secondary | ICD-10-CM | POA: Insufficient documentation

## 2019-12-26 DIAGNOSIS — D123 Benign neoplasm of transverse colon: Secondary | ICD-10-CM | POA: Insufficient documentation

## 2019-12-26 DIAGNOSIS — Z91040 Latex allergy status: Secondary | ICD-10-CM | POA: Insufficient documentation

## 2019-12-26 DIAGNOSIS — K449 Diaphragmatic hernia without obstruction or gangrene: Secondary | ICD-10-CM | POA: Insufficient documentation

## 2019-12-26 DIAGNOSIS — Z888 Allergy status to other drugs, medicaments and biological substances status: Secondary | ICD-10-CM | POA: Insufficient documentation

## 2019-12-26 DIAGNOSIS — K640 First degree hemorrhoids: Secondary | ICD-10-CM | POA: Insufficient documentation

## 2019-12-26 HISTORY — PX: COLONOSCOPY WITH PROPOFOL: SHX5780

## 2019-12-26 HISTORY — PX: ESOPHAGOGASTRODUODENOSCOPY (EGD) WITH PROPOFOL: SHX5813

## 2019-12-26 HISTORY — PX: POLYPECTOMY: SHX5525

## 2019-12-26 SURGERY — COLONOSCOPY WITH PROPOFOL
Anesthesia: General

## 2019-12-26 MED ORDER — LACTATED RINGERS IV SOLN: INTRAVENOUS | Status: DC | PRN

## 2019-12-26 MED ORDER — GLYCOPYRROLATE 0.2 MG/ML IJ SOLN
INTRAMUSCULAR | Status: AC
  Administered 2019-12-26: 0.2 mg via INTRAVENOUS
  Filled 2019-12-26: qty 1

## 2019-12-26 MED ORDER — LIDOCAINE VISCOUS HCL 2 % MT SOLN
OROMUCOSAL | Status: AC
  Administered 2019-12-26: 15 mL via OROMUCOSAL
  Filled 2019-12-26: qty 15

## 2019-12-26 MED ORDER — PROPOFOL 10 MG/ML IV BOLUS
INTRAVENOUS | Status: DC | PRN
  Administered 2019-12-26: 100 mg via INTRAVENOUS
  Administered 2019-12-26: 175 ug/kg/min via INTRAVENOUS

## 2019-12-26 MED ORDER — LIDOCAINE VISCOUS HCL 2 % MT SOLN: 15.0000 mL | Freq: Once | OROMUCOSAL | Status: AC

## 2019-12-26 MED ORDER — STERILE WATER FOR IRRIGATION IR SOLN
Status: DC | PRN
  Administered 2019-12-26: 200 mL

## 2019-12-26 MED ORDER — GLYCOPYRROLATE 0.2 MG/ML IJ SOLN: 0.2000 mg | Freq: Once | INTRAMUSCULAR | Status: AC

## 2019-12-26 MED ORDER — LACTATED RINGERS IV SOLN: Freq: Once | INTRAVENOUS | Status: AC

## 2019-12-26 NOTE — Anesthesia Postprocedure Evaluation (Signed)
Anesthesia Post Note  Patient: Elizabeth Cohen  Procedure(s) Performed: COLONOSCOPY WITH PROPOFOL (N/A ) ESOPHAGOGASTRODUODENOSCOPY (EGD) WITH PROPOFOL (N/A ) POLYPECTOMY  Patient location during evaluation: Endoscopy Anesthesia Type: General Level of consciousness: awake, oriented, awake and alert and patient cooperative Pain management: pain level controlled Vital Signs Assessment: post-procedure vital signs reviewed and stable Respiratory status: spontaneous breathing, respiratory function stable and nonlabored ventilation Cardiovascular status: blood pressure returned to baseline and stable Postop Assessment: no headache and no backache Anesthetic complications: no   No complications documented.   Last Vitals:  Vitals:   12/26/19 1058  BP: 125/88  Resp: 17  Temp: 36.6 C  SpO2: 97%    Last Pain:  Vitals:   12/26/19 1126  TempSrc:   PainSc: [place]

## 2019-12-26 NOTE — Anesthesia Preprocedure Evaluation (Addendum)
Anesthesia Evaluation  Patient identified by MRN, date of birth, ID band Patient awake    Reviewed: Allergy & Precautions, NPO status , Patient's Chart, lab work & pertinent test results  History of Anesthesia Complications (+) PROLONGED EMERGENCE and history of anesthetic complications  Airway Mallampati: II  TM Distance: >3 FB Neck ROM: Full    Dental  (+) Dental Advisory Given Crowns :   Pulmonary former smoker,    Pulmonary exam normal breath sounds clear to auscultation       Cardiovascular Exercise Tolerance: Good hypertension, Pt. on medications Normal cardiovascular exam Rhythm:Regular Rate:Normal     Neuro/Psych  Headaches, PSYCHIATRIC DISORDERS Anxiety Depression  Neuromuscular disease    GI/Hepatic Neg liver ROS, GERD  Medicated and Poorly Controlled,  Endo/Other  negative endocrine ROS  Renal/GU negative Renal ROS  negative genitourinary   Musculoskeletal S/P Lumbar fusion   Abdominal   Peds negative pediatric ROS (+)  Hematology negative hematology ROS (+)   Anesthesia Other Findings   Reproductive/Obstetrics negative OB ROS                            Anesthesia Physical Anesthesia Plan  ASA: II  Anesthesia Plan: General   Post-op Pain Management:    Induction: Intravenous  PONV Risk Score and Plan: TIVA  Airway Management Planned: Nasal Cannula and Natural Airway  Additional Equipment:   Intra-op Plan:   Post-operative Plan:   Informed Consent: I have reviewed the patients History and Physical, chart, labs and discussed the procedure including the risks, benefits and alternatives for the proposed anesthesia with the patient or authorized representative who has indicated his/her understanding and acceptance.     Dental advisory given  Plan Discussed with: CRNA and Surgeon  Anesthesia Plan Comments:        Anesthesia Quick Evaluation

## 2019-12-26 NOTE — Op Note (Signed)
[facility] Patient Name: Elizabeth Cohen Procedure Date: 12/26/2019 11:21 AM MRN: [id number] Date of Birth: 1960-10-02 Attending MD: Norvel Richards , MD CSN: [id number] Age: 59 Admit Type: Outpatient Procedure:                Upper GI endoscopy Indications:              Iron deficiency anemia, Heartburn Providers:                Norvel Richards, MD, Lambert Mody,                            Raphael Gibney, Technician Referring MD:              Medicines:                Propofol per Anesthesia Complications:            No immediate complications. Estimated Blood Loss:     Estimated blood loss was minimal. Procedure:                Pre-Anesthesia Assessment:                           - Prior to the procedure, a History and Physical                            was performed, and patient medications and                            allergies were reviewed. The patient's tolerance of                            previous anesthesia was also reviewed. The risks                            and benefits of the procedure and the sedation                            options and risks were discussed with the patient.                            All questions were answered, and informed consent                            was obtained. Prior Anticoagulants: The patient has                            taken no previous anticoagulant or antiplatelet                            agents. ASA Grade Assessment: II - A patient with                            mild systemic disease. After reviewing the risks  and benefits, the patient was deemed in                            satisfactory condition to undergo the procedure.                           After obtaining informed consent, the endoscope was                            passed under direct vision. Throughout the                            procedure, the patient's blood pressure, pulse, and                             oxygen saturations were monitored continuously. The                            [id number]-[id number] ([id number]) scope was introduced through the                            mouth, and advanced to the second part of duodenum.                            The upper GI endoscopy was accomplished without                            difficulty. The patient tolerated the procedure                            well. Scope In: 11:31:52 AM Scope Out: 11:39:36 AM Total Procedure Duration: 0 hours 7 minutes 44 seconds  Findings:      The examined esophagus was normal.      A small hiatal hernia was present. Multiple 2 to 3 mm hyperplastic       appearing gastric polyps scattered over the mucosa of the stomach. No       ulcer or infiltrating process. Pylorus patent.      The duodenal bulb and second portion of the duodenum were normal. 1       gastric polyp was Impression:               - Normal esophagus.                           - Small hiatal hernia. Gastric polyps -status post                            polypectomy                           - Normal duodenal bulb and second portion of the                            duodenum. Moderate Sedation:      Moderate (conscious) sedation was personally administered by an  anesthesia professional. The following parameters were monitored: oxygen       saturation, heart rate, blood pressure, respiratory rate, EKG, adequacy       of pulmonary ventilation, and response to care. Recommendation:           - Patient has a contact number available for                            emergencies. The signs and symptoms of potential                            delayed complications were discussed with the                            patient. Return to normal activities tomorrow.                            Written discharge instructions were provided to the                            patient.                           - Advance diet as tolerated.                           - Continue  present medications; stop Nexium begin                            Protonix 40 mg twice daily i.e. before breakfast                            and supper.                           - Await pathology results.                           - Return to my office in 6 weeks. See colonoscopy                            report. Procedure Code(s):        --- Professional ---                           [phone], Esophagogastroduodenoscopy, flexible,                            transoral; diagnostic, including collection of                            specimen(s) by brushing or washing, when performed                            (separate procedure) Diagnosis Code(s):        --- Professional ---  K44.9, Diaphragmatic hernia without obstruction or                            gangrene                           D50.9, Iron deficiency anemia, unspecified                           R12, Heartburn CPT copyright 2019 [employer]. All rights reserved. The codes documented in this report are preliminary and upon coder review may  be revised to meet current compliance requirements. Cristopher Estimable. Dominic Mahaney, MD Norvel Richards, MD 12/26/2019 11:46:18 AM This report has been signed electronically. Number of Addenda: 0

## 2019-12-26 NOTE — Transfer of Care (Signed)
Immediate Anesthesia Transfer of Care Note  Patient: Elizabeth Cohen  Procedure(s) Performed: COLONOSCOPY WITH PROPOFOL (N/A ) ESOPHAGOGASTRODUODENOSCOPY (EGD) WITH PROPOFOL (N/A ) POLYPECTOMY  Patient Location: Endoscopy Unit  Anesthesia Type:General  Level of Consciousness: awake, alert , oriented and patient cooperative  Airway & Oxygen Therapy: Patient Spontanous Breathing  Post-op Assessment: Report given to RN, Post -op Vital signs reviewed and stable and Patient moving all extremities  Post vital signs: Reviewed and stable  Last Vitals:  Vitals Value Taken Time  BP    Temp    Pulse    Resp    SpO2      Last Pain:  Vitals:   12/26/19 1126  TempSrc:   PainSc: 4       Patients Stated Pain Goal: 6 (68/25/74 9355)  Complications: No complications documented.

## 2019-12-26 NOTE — Op Note (Signed)
[facility] Patient Name: Elizabeth Cohen Procedure Date: 12/26/2019 11:46 AM MRN: [id number] Date of Birth: 05-15-1960 Attending MD: Norvel Richards , MD CSN: [id number] Age: 59 Admit Type: Outpatient Procedure:                Colonoscopy Indications:              Screening for colorectal malignant neoplasm Providers:                Norvel Richards, MD, Lambert Mody,                            Raphael Gibney, Technician Referring MD:              Medicines:                Propofol per Anesthesia Complications:            No immediate complications. Estimated Blood Loss:     Estimated blood loss was minimal. Procedure:                Pre-Anesthesia Assessment:                           - Prior to the procedure, a History and Physical                            was performed, and patient medications and                            allergies were reviewed. The patient's tolerance of                            previous anesthesia was also reviewed. The risks                            and benefits of the procedure and the sedation                            options and risks were discussed with the patient.                            All questions were answered, and informed consent                            was obtained. Prior Anticoagulants: The patient has                            taken no previous anticoagulant or antiplatelet                            agents. ASA Grade Assessment: II - A patient with                            mild systemic disease. After reviewing the risks  and benefits, the patient was deemed in                            satisfactory condition to undergo the procedure.                           After obtaining informed consent, the colonoscope                            was passed under direct vision. Throughout the                            procedure, the patient's blood pressure, pulse, and                             oxygen saturations were monitored continuously. The                            [id number]-[id number] ([id number]) scope was introduced through                            the anus and advanced to the the cecum, identified                            by appendiceal orifice and ileocecal valve. The                            entire colon was well visualized. Scope In: 11:49:53 AM Scope Out: 03:54:65 PM Scope Withdrawal Time: 0 hours 11 minutes 56 seconds  Total Procedure Duration: 0 hours 18 minutes 25 seconds  Findings:      The perianal and digital rectal examinations were normal.      A 5 mm polyp was found in the hepatic flexure. The polyp was sessile.       The polyp was removed with a cold snare. Resection and retrieval were       complete. Estimated blood loss was minimal.      Non-bleeding internal hemorrhoids were found during retroflexion. The       hemorrhoids were moderate, medium-sized and Grade I (internal       hemorrhoids that do not prolapse).      The exam was otherwise without abnormality on direct and retroflexion       views. Normal-appearing terminal ileum Impression:               - One 5 mm polyp at the hepatic flexure, removed                            with a cold snare. Resected and retrieved.                           - Non-bleeding internal hemorrhoids.                           - The examination was otherwise normal on direct  and retroflexion views. Begin Benefiber 2                            tablespoons daily. Protonix 40 mg twice daily. See                            EGD report. Office visit with [place] in 4 weeks Moderate Sedation:      Moderate (conscious) sedation was personally administered by an       anesthesia professional. The following parameters were monitored: oxygen       saturation, heart rate, blood pressure, and response to care. Recommendation:           - Patient has a contact number available for                             emergencies. The signs and symptoms of potential                            delayed complications were discussed with the                            patient. Return to normal activities tomorrow.                            Written discharge instructions were provided to the                            patient.                           - Resume previous diet.                           - Continue present medications.                           - Repeat colonoscopy date to be determined after                            pending pathology results are reviewed for                            surveillance based on pathology results.                           - Return to GI office in 4 weeks. Procedure Code(s):        --- Professional ---                           [phone], Colonoscopy, flexible; with removal of                            tumor(s), polyp(s), or other lesion(s) by snare  technique Diagnosis Code(s):        --- Professional ---                           Z12.11, Encounter for screening for malignant                            neoplasm of colon                           K63.5, Polyp of colon                           K64.0, First degree hemorrhoids CPT copyright 2019 American Medical Association. All rights reserved. The codes documented in this report are preliminary and upon coder review may  be revised to meet current compliance requirements. Cristopher Estimable. Dayln Tugwell, MD Norvel Richards, MD 12/26/2019 12:09:03 PM This report has been signed electronically. Number of Addenda: 0

## 2019-12-26 NOTE — Discharge Instructions (Signed)
Colonoscopy Discharge Instructions  Read the instructions outlined below and refer to this sheet in the next few weeks. These discharge instructions provide you with general information on caring for yourself after you leave the hospital. Your doctor may also give you specific instructions. While your treatment has been planned according to the most current medical practices available, unavoidable complications occasionally occur. If you have any problems or questions after discharge, call Dr. Gala Romney at [phone]. ACTIVITY  You may resume your regular activity, but move at a slower pace for the next 24 hours.   Take frequent rest periods for the next 24 hours.   Walking will help get rid of the air and reduce the bloated feeling in your belly (abdomen).   No driving for 24 hours (because of the medicine (anesthesia) used during the test).    Do not sign any important legal documents or operate any machinery for 24 hours (because of the anesthesia used during the test).  NUTRITION  Drink plenty of fluids.   You may resume your normal diet as instructed by your doctor.   Begin with a light meal and progress to your normal diet. Heavy or fried foods are harder to digest and may make you feel sick to your stomach (nauseated).   Avoid alcoholic beverages for 24 hours or as instructed.  MEDICATIONS  You may resume your normal medications unless your doctor tells you otherwise.  WHAT YOU CAN EXPECT TODAY  Some feelings of bloating in the abdomen.   Passage of more gas than usual.   Spotting of blood in your stool or on the toilet paper.  IF YOU HAD POLYPS REMOVED DURING THE COLONOSCOPY:  No aspirin products for 7 days or as instructed.   No alcohol for 7 days or as instructed.   Eat a soft diet for the next 24 hours.  FINDING OUT THE RESULTS OF YOUR TEST Not all test results are available during your visit. If your test results are not back during the visit, make an appointment  with your caregiver to find out the results. Do not assume everything is normal if you have not heard from your caregiver or the medical facility. It is important for you to follow up on all of your test results.  SEEK IMMEDIATE MEDICAL ATTENTION IF:  You have more than a spotting of blood in your stool.   Your belly is swollen (abdominal distention).   You are nauseated or vomiting.   You have a temperature over 101.   You have abdominal pain or discomfort that is severe or gets worse throughout the day.  EGD Discharge instructions Please read the instructions outlined below and refer to this sheet in the next few weeks. These discharge instructions provide you with general information on caring for yourself after you leave the hospital. Your doctor may also give you specific instructions. While your treatment has been planned according to the most current medical practices available, unavoidable complications occasionally occur. If you have any problems or questions after discharge, please call your doctor. ACTIVITY  You may resume your regular activity but move at a slower pace for the next 24 hours.   Take frequent rest periods for the next 24 hours.   Walking will help expel (get rid of) the air and reduce the bloated feeling in your abdomen.   No driving for 24 hours (because of the anesthesia (medicine) used during the test).   You may shower.   Do not sign any important  legal documents or operate any machinery for 24 hours (because of the anesthesia used during the test).  NUTRITION  Drink plenty of fluids.   You may resume your normal diet.   Begin with a light meal and progress to your normal diet.   Avoid alcoholic beverages for 24 hours or as instructed by your caregiver.  MEDICATIONS  You may resume your normal medications unless your caregiver tells you otherwise.  WHAT YOU CAN EXPECT TODAY  You may experience abdominal discomfort such as a feeling of fullness  or "gas" pains.  FOLLOW-UP  Your doctor will discuss the results of your test with you.  SEEK IMMEDIATE MEDICAL ATTENTION IF ANY OF THE FOLLOWING OCCUR:  Excessive nausea (feeling sick to your stomach) and/or vomiting.   Severe abdominal pain and distention (swelling).   Trouble swallowing.   Temperature over 101 F (37.8 C).   Rectal bleeding or vomiting of blood.   1 polyp removed from both your stomach and your colon today  You need more aggressive treatment for GERD  Colon polyp and GERD information provided  Stop Nexium; begin Protonix 40 mg twice daily-take before breakfast and supper  Office visit with [place] in 4 weeks  At patient request, I called [name] at [phone] and reviewed findings and recommendations   Colon Polyps  Polyps are tissue growths inside the body. Polyps can grow in many places, including the large intestine (colon). A polyp may be a round bump or a mushroom-shaped growth. You could have one polyp or several. Most colon polyps are noncancerous (benign). However, some colon polyps can become cancerous over time. Finding and removing the polyps early can help prevent this. What are the causes? The exact cause of colon polyps is not known. What increases the risk? You are more likely to develop this condition if you:  Have a family history of colon cancer or colon polyps.  Are older than 75 or older than 45 if you are African American.  Have inflammatory bowel disease, such as ulcerative colitis or Crohn's disease.  Have certain hereditary conditions, such as: ? Familial adenomatous polyposis. ? Lynch syndrome. ? Turcot syndrome. ? Peutz-Jeghers syndrome.  Are overweight.  Smoke cigarettes.  Do not get enough exercise.  Drink too much alcohol.  Eat a diet that is high in fat and red meat and low in fiber.  Had childhood cancer that was treated with abdominal radiation. What are the signs or symptoms? Most polyps do not cause  symptoms. If you have symptoms, they may include:  Blood coming from your rectum when having a bowel movement.  Blood in your stool. The stool may look dark red or black.  Abdominal pain.  A change in bowel habits, such as constipation or diarrhea. How is this diagnosed? This condition is diagnosed with a colonoscopy. This is a procedure in which a lighted, flexible scope is inserted into the anus and then passed into the colon to examine the area. Polyps are sometimes found when a colonoscopy is done as part of routine cancer screening tests. How is this treated? Treatment for this condition involves removing any polyps that are found. Most polyps can be removed during a colonoscopy. Those polyps will then be tested for cancer. Additional treatment may be needed depending on the results of testing. Follow these instructions at home: Lifestyle  Maintain a healthy weight, or lose weight if recommended by your health care provider.  Exercise every day or as told by your health care provider.  Do not use any products that contain nicotine or tobacco, such as cigarettes and e-cigarettes. If you need help quitting, ask your health care provider.  If you drink alcohol, limit how much you have: ? 0-1 drink a day for women. ? 0-2 drinks a day for men.  Be aware of how much alcohol is in your drink. In the [place] one drink equals one 12 oz bottle of beer (355 mL), one 5 oz glass of wine (148 mL), or one 1 oz shot of hard liquor (44 mL). Eating and drinking   Eat foods that are high in fiber, such as fruits, vegetables, and whole grains.  Eat foods that are high in calcium and vitamin D, such as milk, cheese, yogurt, eggs, liver, fish, and broccoli.  Limit foods that are high in fat, such as fried foods and desserts.  Limit the amount of red meat and processed meat you eat, such as hot dogs, sausage, bacon, and lunch meats. General instructions  Keep all follow-up visits as told by your  health care provider. This is important. ? This includes having regularly scheduled colonoscopies. ? Talk to your health care provider about when you need a colonoscopy. Contact a health care provider if:  You have new or worsening bleeding during a bowel movement.  You have new or increased blood in your stool.  You have a change in bowel habits.  You lose weight for no known reason. Summary  Polyps are tissue growths inside the body. Polyps can grow in many places, including the colon.  Most colon polyps are noncancerous (benign), but some can become cancerous over time.  This condition is diagnosed with a colonoscopy.  Treatment for this condition involves removing any polyps that are found. Most polyps can be removed during a colonoscopy. This information is not intended to replace advice given to you by your health care provider. Make sure you discuss any questions you have with your health care provider. Document Revised: 05/18/2017 Document Reviewed: 05/18/2017 Elsevier Patient Education  [place].  Gastroesophageal Reflux Disease, Adult Gastroesophageal reflux (GER) happens when acid from the stomach flows up into the tube that connects the mouth and the stomach (esophagus). Normally, food travels down the esophagus and stays in the stomach to be digested. With GER, food and stomach acid sometimes move back up into the esophagus. You may have a disease called gastroesophageal reflux disease (GERD) if the reflux:  Happens often.  Causes frequent or very bad symptoms.  Causes problems such as damage to the esophagus. When this happens, the esophagus becomes sore and swollen (inflamed). Over time, GERD can make small holes (ulcers) in the lining of the esophagus. What are the causes? This condition is caused by a problem with the muscle between the esophagus and the stomach. When this muscle is weak or not normal, it does not close properly to keep food and acid from  coming back up from the stomach. The muscle can be weak because of:  Tobacco use.  Pregnancy.  Having a certain type of hernia (hiatal hernia).  Alcohol use.  Certain foods and drinks, such as coffee, chocolate, onions, and peppermint. What increases the risk? You are more likely to develop this condition if you:  Are overweight.  Have a disease that affects your connective tissue.  Use NSAID medicines. What are the signs or symptoms? Symptoms of this condition include:  Heartburn.  Difficult or painful swallowing.  The feeling of having a lump in the throat.  A bitter taste in the mouth.  Bad breath.  Having a lot of saliva.  Having an upset or bloated stomach.  Belching.  Chest pain. Different conditions can cause chest pain. Make sure you see your doctor if you have chest pain.  Shortness of breath or noisy breathing (wheezing).  Ongoing (chronic) cough or a cough at night.  Wearing away of the surface of teeth (tooth enamel).  Weight loss. How is this treated? Treatment will depend on how bad your symptoms are. Your doctor may suggest:  Changes to your diet.  Medicine.  Surgery. Follow these instructions at home: Eating and drinking   Follow a diet as told by your doctor. You may need to avoid foods and drinks such as: ? Coffee and tea (with or without caffeine). ? Drinks that contain alcohol. ? Energy drinks and sports drinks. ? Bubbly (carbonated) drinks or sodas. ? Chocolate and cocoa. ? Peppermint and mint flavorings. ? Garlic and onions. ? Horseradish. ? Spicy and acidic foods. These include peppers, chili powder, curry powder, vinegar, hot sauces, and BBQ sauce. ? Citrus fruit juices and citrus fruits, such as oranges, lemons, and limes. ? Tomato-based foods. These include red sauce, chili, salsa, and pizza with red sauce. ? Fried and fatty foods. These include donuts, french fries, potato chips, and high-fat dressings. ? High-fat  meats. These include hot dogs, rib eye steak, sausage, ham, and bacon. ? High-fat dairy items, such as whole milk, butter, and cream cheese.  Eat small meals often. Avoid eating large meals.  Avoid drinking large amounts of liquid with your meals.  Avoid eating meals during the 2-3 hours before bedtime.  Avoid lying down right after you eat.  Do not exercise right after you eat. Lifestyle   Do not use any products that contain nicotine or tobacco. These include cigarettes, e-cigarettes, and chewing tobacco. If you need help quitting, ask your doctor.  Try to lower your stress. If you need help doing this, ask your doctor.  If you are overweight, lose an amount of weight that is healthy for you. Ask your doctor about a safe weight loss goal. General instructions  Pay attention to any changes in your symptoms.  Take over-the-counter and prescription medicines only as told by your doctor. Do not take aspirin, ibuprofen, or other NSAIDs unless your doctor says it is okay.  Wear loose clothes. Do not wear anything tight around your waist.  Raise (elevate) the head of your bed about 6 inches (15 cm).  Avoid bending over if this makes your symptoms worse.  Keep all follow-up visits as told by your doctor. This is important. Contact a doctor if:  You have new symptoms.  You lose weight and you do not know why.  You have trouble swallowing or it hurts to swallow.  You have wheezing or a cough that keeps happening.  Your symptoms do not get better with treatment.  You have a hoarse voice. Get help right away if:  You have pain in your arms, neck, jaw, teeth, or back.  You feel sweaty, dizzy, or light-headed.  You have chest pain or shortness of breath.  You throw up (vomit) and your throw-up looks like blood or coffee grounds.  You pass out (faint).  Your poop (stool) is bloody or black.  You cannot swallow, drink, or eat. Summary  If a person has gastroesophageal  reflux disease (GERD), food and stomach acid move back up into the esophagus and cause symptoms or problems  such as damage to the esophagus.  Treatment will depend on how bad your symptoms are.  Follow a diet as told by your doctor.  Take all medicines only as told by your doctor. This information is not intended to replace advice given to you by your health care provider. Make sure you discuss any questions you have with your health care provider. Document Revised: 08/09/2017 Document Reviewed: 08/09/2017 Elsevier Patient Education  [place].

## 2019-12-26 NOTE — Interval H&P Note (Signed)
History and Physical Interval Note:  12/26/2019 11:19 AM  Elizabeth Cohen  has presented today for surgery, with the diagnosis of GERD, iron deficiency anemia.  The various methods of treatment have been discussed with the patient and family. After consideration of risks, benefits and other options for treatment, the patient has consented to  Procedure(s) with comments: COLONOSCOPY WITH PROPOFOL (N/A) - 12:30pm ESOPHAGOGASTRODUODENOSCOPY (EGD) WITH PROPOFOL (N/A) as a surgical intervention.  The patient's history has been reviewed, patient examined, no change in status, stable for surgery.  I have reviewed the patient's chart and labs.  Questions were answered to the patient's satisfaction.     Manus Rudd  Diagnostic EGD and colonoscopy per plan.  Patient denies dysphagia.  She is constipated about 50% of time loose bowels the other 50%.  The risks, benefits, limitations, imponderables and alternatives regarding both EGD and colonoscopy have been reviewed with the patient. Questions have been answered. All parties agreeable.

## 2019-12-27 ENCOUNTER — Other Ambulatory Visit: Payer: Self-pay

## 2019-12-27 LAB — SURGICAL PATHOLOGY

## 2019-12-29 ENCOUNTER — Encounter: Payer: Self-pay | Admitting: Internal Medicine

## 2020-01-01 ENCOUNTER — Telehealth: Payer: Self-pay | Admitting: Internal Medicine

## 2020-01-01 NOTE — Telephone Encounter (Signed)
Spoke with pt. Pt was notified of results and notified that a letter was mailed to her.

## 2020-01-01 NOTE — Telephone Encounter (Signed)
Pt was checking to see if her procedure results were back yet. [phone]

## 2020-01-02 ENCOUNTER — Encounter (HOSPITAL_COMMUNITY): Payer: Self-pay | Admitting: Internal Medicine

## 2020-01-28 ENCOUNTER — Ambulatory Visit (INDEPENDENT_AMBULATORY_CARE_PROVIDER_SITE_OTHER): Payer: BC Managed Care – PPO | Admitting: Internal Medicine

## 2020-01-28 ENCOUNTER — Other Ambulatory Visit: Payer: Self-pay

## 2020-01-28 ENCOUNTER — Encounter: Payer: Self-pay | Admitting: Internal Medicine

## 2020-01-28 VITALS — BP 126/79 | HR 65 | Temp 96.6°F | Ht 68.0 in | Wt 208.0 lb

## 2020-01-28 DIAGNOSIS — K219 Gastro-esophageal reflux disease without esophagitis: Secondary | ICD-10-CM | POA: Diagnosis not present

## 2020-01-28 DIAGNOSIS — K582 Mixed irritable bowel syndrome: Secondary | ICD-10-CM | POA: Diagnosis not present

## 2020-01-28 NOTE — Patient Instructions (Addendum)
°  Continue protonix 40 mg twice daily  Use pepsid complete as needs for flares of reflux  20 pound weight over the next 6 months  Align probiotic daily  Benefiber 1 tablespoon daily  Information on IBS and GERD provided  OV in 8 weeks

## 2020-01-28 NOTE — Progress Notes (Signed)
Primary Care Physician:  Dara Lords, NP Primary Gastroenterologist:  Dr. Gala Romney  Pre-Procedure History & Physical: HPI:  Elizabeth Cohen is a 59 y.o. female here for office follow-up.  Has diarrhea about 50% of time and then constipation the other half.  Cycles 4 days of each.  No rectal bleeding.  Recent adenoma removed from her colon; due for surveillance 5 years.  Hyperplastic polyp and hiatal hernia found on EGD.  PPI changed to Protonix 40 mg twice daily.  Significant improvement in symptoms of reflux but still has breakthrough 3 times weekly.  She is 40 to 50 pounds over ideal body weight range.  She has joined [employer].  Has not started Benefiber.  Past Medical History:  Diagnosis Date  . Acid reflux   . Anxiety    OCD  . Bell's palsy 03/2019  . Cancer ([place])    Skin right arm and left ear, left breast  . Complication of anesthesia    slow to wake up during wisdom teeth ext, no other problems with the other surgery  . Depression   . 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 Hypertension   . Migraine   . Positive ANA (antinuclear antibody)    no problems, no meds    Past Surgical History:  Procedure Laterality Date  . ABDOMINAL HYSTERECTOMY    . APPENDECTOMY  2002  . BACK SURGERY  07/25/2019   Laminectomy and poraminotomy lumbar @ [facility]  . CARPAL TUNNEL RELEASE  2015  . CHOLECYSTECTOMY  2004  . COLONOSCOPY  10/04/2010   Procedure: COLONOSCOPY;  Surgeon: Daneil Dolin, MD;  [facility];  Service: Endoscopy;  Laterality: N/A;  8:15AM  . COLONOSCOPY WITH PROPOFOL N/A 12/26/2019   Procedure: COLONOSCOPY WITH PROPOFOL;  Surgeon: Daneil Dolin, MD;  [facility];  Service: Endoscopy;  Laterality: N/A;  12:30pm  . ESOPHAGOGASTRODUODENOSCOPY (EGD) WITH PROPOFOL N/A 12/26/2019   Procedure: ESOPHAGOGASTRODUODENOSCOPY (EGD) WITH PROPOFOL;  Surgeon: Daneil Dolin, MD;  [facility];  Service: Endoscopy;  Laterality: N/A;  . EXTERNAL EAR SURGERY Left 01/2018   Skin cancer  . FOOT SURGERY Bilateral 2018  . POLYPECTOMY  12/26/2019   Procedure: POLYPECTOMY;  Surgeon: Daneil Dolin, MD;  [facility];  Service: Endoscopy;;  . SKIN CANCER EXCISION Right 09/20/2018   Right arm  . TENDON REPAIR    . ULNAR NERVE REPAIR    . UPPER GI ENDOSCOPY    . WISDOM TOOTH EXTRACTION      Prior to Admission medications   Medication Sig Start Date End Date Taking? Authorizing Provider  Calcium Carbonate Antacid (TUMS PO) Take 2 tablets by mouth daily as needed (heartburn).    Yes [provider]  chlorthalidone (HYGROTON) 25 MG tablet Take 25 mg by mouth daily.  03/08/19  Yes [provider]  DULoxetine (CYMBALTA) 30 MG capsule Take 30 mg by mouth 2 (two) times daily. 06/08/19  Yes [provider]  fluticasone (FLONASE) 50 MCG/ACT nasal spray Place 1 spray into both nostrils daily.   Yes [provider]  ibuprofen (ADVIL) 200 MG tablet Take 800 mg by mouth every 6 (six) hours as needed for headache or moderate pain.   Yes [provider]  metoprolol tartrate (LOPRESSOR) 25 MG tablet Take 25 mg by mouth 2 (two) times daily.  02/10/19  Yes [provider]  pantoprazole (PROTONIX) 40 MG tablet Take 40 mg by mouth 2 (two) times daily. 01/20/20  Yes [provider]  rizatriptan (MAXALT) 10 MG tablet Take 10 mg by mouth daily as needed for migraine. May repeat in 2 hours if needed   Yes [provider]    Allergies as of 01/28/2020 - Review Complete 01/28/2020  Allergen Reaction Noted  . Celecoxib Hives   . Codeine Itching and Nausea And Vomiting   . Epinephrine Other (See Comments)   . Gabapentin Other (See Comments) 07/10/2019  . Latex    . Morphine Hives   . Naproxen Other (See Comments)   . Povidone-iodine    . Tape Other (See Comments)   . Topamax [topiramate]   07/10/2019  . Sulfonamide derivatives Rash     Family History  Problem Relation Age of Onset  . Stroke Mother   . Hypertension Mother   . Heart disease Mother   . Atrial fibrillation Mother   . Hypertension Father     Social History   Socioeconomic History  . Marital status: Married    Spouse name: Not on file  . Number of children: 1  . Years of education: some college  . Highest education level: Not on file  Occupational History  . Not on file  Tobacco Use  . Smoking status: Former Smoker    Packs/day: 1.00    Years: 10.00    Pack years: 10.00    Types: Cigarettes    Quit date: 1998    Years since quitting: 23.9  . Smokeless tobacco: Never Used  Vaping Use  . Vaping Use: Never used  Substance and Sexual Activity  . Alcohol use: Never    Alcohol/week: 0.0 standard drinks  . Drug use: Never  . Sexual activity: Yes    Birth control/protection: Surgical    Comment: Hysterectomy  Other Topics Concern  . Not on file  Social History Narrative   Lives at home with her husband.   Right-handed.   No caffeine use.   Social Determinants of Health   Financial Resource Strain: Not on file  Food Insecurity: Not on file  Transportation Needs: Not on file  Physical Activity: Not on file  Stress: Not on file  Social Connections: Not on file  Intimate Partner Violence: Not on file    Review of Systems: See HPI, otherwise negative ROS  Physical Exam: BP 126/79   Pulse 65   Temp (!) 96.6 F (35.9 C) (Temporal)   Ht 5\' 8"  (1.727 m)   Wt 208 lb (94.3 kg)   LMP  (LMP Unknown)   BMI 31.63 kg/m  General:   Alert,  Well-developed, well-nourished, pleasant and cooperative in NAD Neck:  Supple; no masses or thyromegaly. No significant cervical adenopathy. Lungs:  Clear throughout to auscultation.   No wheezes, crackles, or rhonchi. No acute distress. Heart:  Regular rate and rhythm; no murmurs, clicks, rubs,  or gallops. Abdomen: Non-distended, normal bowel sounds.   Soft and nontender without appreciable mass or hepatosplenomegaly.  Pulses:  Normal pulses noted. Extremities:  Without clubbing or edema.  Impression/Plan: 59 year old lady with alternating constipation and diarrhea most consistent with irritable bowel syndrome in the setting.  Recalcitrant GERD.  Recent colonoscopy and EGD reassuring. We talked about the benign but chronic nature of IBS.  Our goal is for her good days to outnumber her bad days.  Recommendations:   Continue protonix 40 mg twice daily  Use pepsid complete as needs for flares of reflux  20 pound weight over the next 6 months  Align probiotic daily  Benefiber 1 tablespoon daily  Information on IBS and GERD provided  OV in 8 weeks    Notice: This dictation was prepared with Dragon dictation along with smaller phrase technology. Any transcriptional errors that result from this process are unintentional and may not be corrected upon review.

## 2020-03-31 ENCOUNTER — Ambulatory Visit: Payer: BC Managed Care – PPO | Admitting: Internal Medicine

## 2020-05-05 ENCOUNTER — Ambulatory Visit: Payer: BC Managed Care – PPO | Admitting: Internal Medicine

## 2020-05-05 ENCOUNTER — Other Ambulatory Visit: Payer: Self-pay

## 2020-05-05 ENCOUNTER — Encounter: Payer: Self-pay | Admitting: Internal Medicine

## 2020-05-05 VITALS — BP 118/77 | HR 57 | Temp 96.9°F | Ht 68.0 in | Wt 208.4 lb

## 2020-05-05 DIAGNOSIS — K219 Gastro-esophageal reflux disease without esophagitis: Secondary | ICD-10-CM | POA: Diagnosis not present

## 2020-05-05 DIAGNOSIS — K5909 Other constipation: Secondary | ICD-10-CM

## 2020-05-05 NOTE — Patient Instructions (Addendum)
GERD information provided  Elevate the head of your bed with 2 x 4 blocks  Stop Protonix  Trial of rabeprazole 20 mg twice daily (before breakfast and supper) dispense 60 with 3 refills  Stop Pepcid Complete  Prescription for famotidine 40 mg tablets -take 1 twice a day as needed for reflux symptoms not controlled with rabeprazole (dispense 60) with 3 refills  Look up reflux gourmet on [email or web address]  I recommend you try this preparation.  Take it after you eat a meal up to 3 times a day and at bedtime  As discussed, weight loss as feasible  Continue Benefiber daily.  Office visit with [place] in 6 to 8 weeks

## 2020-05-05 NOTE — Progress Notes (Signed)
Primary Care Physician:  Salmon Nation, MD Primary Gastroenterologist:  Dr. Gala Romney  Pre-Procedure History & Physical: HPI:  Elizabeth Cohen is a 60 y.o. female here for follow-up of GERD and constipation with constipation now well managed with Benefiber daily.  GERD continues to be an issue.  Switched out from esomeprazole to pantoprazole.  40 mg twice daily.  Continues to have reflux symptoms more or less daily.  No dysphagia.  Some associated nausea but no vomiting.  Compliant with pantoprazole 40 mg before supper and breakfast.  Does not like taking Pepcid Complete because of the chalky taste.  Dietary recall indicates she is on a very good prudent antireflux diet.  Her spine problems have been associated with  decreased mobility and is largely the cause of her 30 to 40 pound weight gain over the past 3 to 4 years.  She is now on hydrocodone for back pain as well.  Past Medical History:  Diagnosis Date   Acid reflux    Anxiety    OCD   Bell's palsy 03/2019   Cancer ([place])    Skin right arm and left ear, left breast   Complication of anesthesia    slow to wake up during wisdom teeth ext, no other problems with the other surgery   Depression    History of skin cancer    Basal cell, squamous cell   right arm and left ear, left breast    Hypertension    Migraine    Positive ANA (antinuclear antibody)    no problems, no meds    Past Surgical History:  Procedure Laterality Date   ABDOMINAL HYSTERECTOMY     APPENDECTOMY  2002   BACK SURGERY  07/25/2019   Laminectomy and poraminotomy lumbar @ [facility]  2015   CHOLECYSTECTOMY  2004   COLONOSCOPY  10/04/2010   Procedure: COLONOSCOPY;  Surgeon: Daneil Dolin, MD;  [facility];  Service: Endoscopy;  Laterality: N/A;  8:15AM   COLONOSCOPY WITH PROPOFOL N/A 12/26/2019   Procedure: COLONOSCOPY WITH PROPOFOL;  Surgeon: Daneil Dolin, MD;  [facility];  Service: Endoscopy;  Laterality: N/A;  12:30pm   ESOPHAGOGASTRODUODENOSCOPY (EGD) WITH PROPOFOL N/A 12/26/2019   Procedure: ESOPHAGOGASTRODUODENOSCOPY (EGD) WITH PROPOFOL;  Surgeon: Daneil Dolin, MD;  [facility];  Service: Endoscopy;  Laterality: N/A;   EXTERNAL EAR SURGERY Left 01/2018   Skin cancer   FOOT SURGERY Bilateral 2018   POLYPECTOMY  12/26/2019   Procedure: POLYPECTOMY;  Surgeon: Daneil Dolin, MD;  [facility];  Service: Endoscopy;;   SKIN CANCER EXCISION Right 09/20/2018   Right arm   TENDON REPAIR     ULNAR NERVE REPAIR     UPPER GI ENDOSCOPY     WISDOM TOOTH EXTRACTION      Prior to Admission medications   Medication Sig Start Date End Date Taking? Authorizing Provider  Calcium Carbonate Antacid (TUMS PO) Take 2 tablets by mouth daily as needed (heartburn).    Yes [provider]  chlorthalidone (HYGROTON) 25 MG tablet Take 25 mg by mouth daily.  03/08/19  Yes [provider]  fluticasone (FLONASE) 50 MCG/ACT nasal spray Place 1 spray into both nostrils daily.   Yes [provider]  HYDROcodone-acetaminophen (NORCO/VICODIN) 5-325 MG tablet Take 1 tablet by mouth every 6 (six) hours as needed. for pain 04/09/20  Yes [provider]  ibuprofen (ADVIL)  200 MG tablet Take 800 mg by mouth every 6 (six) hours as needed for headache or moderate pain.   Yes [provider]  metoprolol tartrate (LOPRESSOR) 25 MG tablet Take 25 mg by mouth 2 (two) times daily. 02/10/19  Yes [provider]  pantoprazole (PROTONIX) 40 MG tablet Take 40 mg by mouth 2 (two) times daily. 01/20/20  Yes [provider]  PARoxetine (PAXIL) 40 MG tablet Take 40 mg by mouth daily. 04/11/20  Yes [provider]  Probiotic Product (PROBIOTIC DAILY PO) Take by mouth daily.   Yes [provider]  rizatriptan (MAXALT) 10 MG tablet Take 10 mg by mouth daily as needed for migraine. May  repeat in 2 hours if needed   Yes [provider]  Wheat Dextrin (BENEFIBER) POWD Take by mouth daily. 2 teaspoons daily   Yes [provider]    Allergies as of 05/05/2020 - Review Complete 05/05/2020  Allergen Reaction Noted   Celecoxib Hives    Codeine Itching and Nausea And Vomiting    Epinephrine Other (See Comments)    Gabapentin Other (See Comments) 07/10/2019   Latex     Morphine Hives    Naproxen Other (See Comments)    Povidone-iodine     Tape Other (See Comments)    Topamax [topiramate]  07/10/2019   Sulfonamide derivatives Rash     Family History  Problem Relation Age of Onset   Stroke Mother    Hypertension Mother    Heart disease Mother    Atrial fibrillation Mother    Hypertension Father     Social History   Socioeconomic History   Marital status: Married    Spouse name: Not on file   Number of children: 1   Years of education: some college   Highest education level: Not on file  Occupational History   Not on file  Tobacco Use   Smoking status: Former Smoker    Packs/day: 1.00    Years: 10.00    Pack years: 10.00    Types: Cigarettes    Quit date: 1998    Years since quitting: 24.2   Smokeless tobacco: Never Used  Scientific laboratory technician Use: Never used  Substance and Sexual Activity   Alcohol use: Never    Alcohol/week: 0.0 standard drinks   Drug use: Never   Sexual activity: Yes    Birth control/protection: Surgical    Comment: Hysterectomy  Other Topics Concern   Not on file  Social History Narrative   Lives at home with her husband.   Right-handed.   No caffeine use.   Social Determinants of Health   Financial Resource Strain: Not on file  Food Insecurity: Not on file  Transportation Needs: Not on file  Physical Activity: Not on file  Stress: Not on file  Social Connections: Not on file  Intimate Partner Violence: Not on file    Review of Systems: See HPI, otherwise negative  ROS  Physical Exam: BP 118/77    Pulse (!) 57    Temp (!) 96.9 F (36.1 C) (Temporal)    Ht 5\' 8"  (1.727 m)    Wt 208 lb 6.4 oz (94.5 kg)    LMP  (LMP Unknown)    BMI 31.69 kg/m  General:   Alert,  Well-developed, well-nourished, pleasant and cooperative in NAD  Impression/Plan: 60 year old obese lady with constipation well managed with daily fiber supplement in the way of Benefiber.  Longstanding GERD in the setting of  significant weight gain.  She has refractory symptoms to twice daily PPI therapy.  Not really embracing supplemental H2 blocker therapy because of personal preference.  We talked about the multi-pronged approach to gastroesophageal reflux disease.  She is doing well with diet.  Weight gain is the big issue here.  She has limited exercise capacity.  Discussed the spectrum of treatment for GERD including antireflux surgery.  In no way would  recommend going in that direction at this time.  Recommendations:  GERD information provided  Elevate the head of the bed with 2 x 4 blocks  Stop Protonix  Trial of rabeprazole 20 mg twice daily (before breakfast and supper) dispense 60 with 3 refills  Stop Pepcid Complete  Prescription for famotidine 40 mg tablets -take 1 twice a day as needed for reflux symptoms not controlled with rabeprazole (dispense 60) with 3 refills  Look up reflux gourmet on [email or web address]  I recommend you try this preparation. Take it after you eat a meal up to 3 times a day and at bedtime  As discussed, weight loss as feasible  Continue Benefiber daily.  Office visit with [place] in 6 to 8 weeks        Notice: This dictation was prepared with Dragon dictation along with smaller phrase technology. Any transcriptional errors that result from this process are unintentional and may not be corrected upon review.

## 2020-05-06 ENCOUNTER — Ambulatory Visit: Payer: BC Managed Care – PPO | Attending: Orthopedic Surgery | Admitting: Physical Therapy

## 2020-05-06 ENCOUNTER — Other Ambulatory Visit: Payer: Self-pay

## 2020-05-06 ENCOUNTER — Encounter: Payer: Self-pay | Admitting: Physical Therapy

## 2020-05-06 DIAGNOSIS — G8929 Other chronic pain: Secondary | ICD-10-CM | POA: Diagnosis present

## 2020-05-06 DIAGNOSIS — M25562 Pain in left knee: Secondary | ICD-10-CM | POA: Insufficient documentation

## 2020-05-06 DIAGNOSIS — M25561 Pain in right knee: Secondary | ICD-10-CM | POA: Diagnosis present

## 2020-05-06 DIAGNOSIS — M6281 Muscle weakness (generalized): Secondary | ICD-10-CM | POA: Diagnosis present

## 2020-05-06 NOTE — Therapy (Signed)
[facility] [place], [place], [address] Phone: [phone]   Fax:  [phone]  Physical Therapy Evaluation  Patient Details  Name: Elizabeth Cohen MRN: [id number] Date of Birth: [birth year] Referring Provider (PT): Netta Cedars, MD   Encounter Date: 05/07/2058   PT End of Session - 05/06/20 1843    Visit Number 1    Number of Visits 8    Date for PT Re-Evaluation 06/10/20    Authorization Type BCBS; Progress note every 10th visit    PT Start Time 0947    PT Stop Time 1030    PT Time Calculation (min) 43 min    Activity Tolerance Patient tolerated treatment well;Patient limited by pain    Behavior During Therapy [facility] for tasks assessed/performed           Past Medical History:  Diagnosis Date  . Acid reflux   . Anxiety    OCD  . Bell's palsy 03/2019  . Cancer ([place])    Skin right arm and left ear, left breast  . Complication of anesthesia    slow to wake up during wisdom teeth ext, no other problems with the other surgery  . Depression   . 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 Hypertension   . Migraine   . Positive ANA (antinuclear antibody)    no problems, no meds    Past Surgical History:  Procedure Laterality Date  . ABDOMINAL HYSTERECTOMY    . APPENDECTOMY  2002  . BACK SURGERY  07/25/2019   Laminectomy and poraminotomy lumbar @ [facility] [place]  . CARPAL TUNNEL RELEASE  2015  . CHOLECYSTECTOMY  2004  . COLONOSCOPY  10/04/2010   Procedure: COLONOSCOPY;  Surgeon: Daneil Dolin, MD;  [facility];  Service: Endoscopy;  Laterality: N/A;  8:15AM  . COLONOSCOPY WITH PROPOFOL N/A 12/26/2019   Procedure: COLONOSCOPY WITH PROPOFOL;  Surgeon: Daneil Dolin, MD;  [facility];  Service: Endoscopy;  Laterality: N/A;  12:30pm  . ESOPHAGOGASTRODUODENOSCOPY (EGD) WITH PROPOFOL N/A 12/26/2019   Procedure:  ESOPHAGOGASTRODUODENOSCOPY (EGD) WITH PROPOFOL;  Surgeon: Daneil Dolin, MD;  [facility];  Service: Endoscopy;  Laterality: N/A;  . EXTERNAL EAR SURGERY Left 01/2018   Skin cancer  . FOOT SURGERY Bilateral 2018  . POLYPECTOMY  12/26/2019   Procedure: POLYPECTOMY;  Surgeon: Daneil Dolin, MD;  [facility];  Service: Endoscopy;;  . SKIN CANCER EXCISION Right 09/20/2018   Right arm  . TENDON REPAIR    . ULNAR NERVE REPAIR    . UPPER GI ENDOSCOPY    . WISDOM TOOTH EXTRACTION      There were no vitals filed for this visit.    Subjective Assessment - 05/06/20 1831    Subjective COVID-19 screening performed upon arrival. Patient arrives to physical therapy with reports of chronic bilateral knee pain L>R. Patinet reports bilateral LE weakness with knee buckling. Patient also reports history of back pain with two back surgeries with ongoing left LE symptoms to left 2nd through 4th toes. Patient reports ADLs and home activities are difficult and requires multiple rest breaks to perform activities. Patinet reports two falls in the past 6 months with the most recent fall occuring in the shower. Patient requires assistance to come to standing. Patient reports pain at worst as 8.5/10 and pain at  best as  3.5-4/10. Patient's goals are to decrease pain, improve movement, improve strength, and increase standing, walking, and sitting tolerance, and have less difficulties with home and work activities.    Pertinent History latex and tape allergy, anxiety, depression, HTN, migraines, lumbar fusion, history of ulnar nerve repair, history of bilateral foot surgery, history of bells palsy    Limitations Sitting;Standing;Walking;House hold activities    How long can you sit comfortably? "short periods"    How long can you stand comfortably? "<10 mins"    How long can you walk comfortably? "< 10 mins"    Diagnostic tests x-ray: arthritis    Patient Stated Goals decrease pain     Currently in Pain? Yes    Pain Score 6     Pain Location Knee    Pain Orientation Right;Left    Pain Descriptors / Indicators Aching;Throbbing;Numbness;Shooting    Pain Type Chronic pain    Pain Onset More than a month ago    Pain Frequency Constant    Aggravating Factors  standing walking too long    Pain Relieving Factors heat, laying down, massaging knees    Effect of Pain on Daily Activities difficult to perform daily tasks.              [facility] PT Assessment - 05/06/20 0001      Assessment   Medical Diagnosis Pain in right knee, Pain in left knee    Referring Provider (PT) Netta Cedars, MD    Onset Date/Surgical Date --   chronic   Next MD Visit "6 weeks"    Prior Therapy no      Precautions   Precautions Fall      Restrictions   Weight Bearing Restrictions No      Balance Screen   Has the patient fallen in the past 6 months Yes    How many times? 2    Has the patient had a decrease in activity level because of a fear of falling?  No    Is the patient reluctant to leave their home because of a fear of falling?  No      Home [employer] residence    Living Arrangements Spouse/significant other    [place] to enter    Entrance Stairs-Number of Steps 5    Entrance Stairs-Rails Can reach both      Prior Function   Level of Independence Independent with basic ADLs      Observation/Other Assessments-Edema    Edema Circumferential      Circumferential Edema   Circumferential - Right 41.5 cm at midpatella    Circumferential - Left  41.5 cm at midpatella      ROM / Strength   AROM / PROM / Strength AROM;PROM;Strength      AROM   Overall AROM  Deficits;Due to pain    AROM Assessment Site Knee    Right/Left Knee Left;Right    Right Knee Extension 2   into hyperextension   Right Knee Flexion 127    Left Knee Extension 2    Left Knee Flexion 107      PROM   Overall PROM  Within functional limits for tasks performed;Due  to pain    PROM Assessment Site Knee    Right/Left Knee Right;Left    Right Knee Extension 2   into hyperextension   Right Knee Flexion 130    Left Knee Extension 0    Left Knee Flexion 128  Strength   Overall Strength Deficits    Strength Assessment Site Hip;Knee    Right/Left Hip Right;Left    Right Hip Flexion 3-/5    Right Hip Extension 3-/5    Right Hip ABduction 3-/5    Left Hip Flexion 3-/5    Left Hip Extension 3-/5    Left Hip ABduction 3-/5    Right/Left Knee Right;Left    Right Knee Flexion 4/5    Right Knee Extension 4/5    Left Knee Flexion 4/5    Left Knee Extension 4-/5      Palpation   Patella mobility WFL bilaterally    Palpation comment tenderness throughout bilateral knees      Transfers   Five time sit to stand comments  32.1 seconds with UE support      Ambulation/Gait   Gait Pattern Step-through pattern;Decreased stride length;Decreased stance time - left;Decreased weight shift to left;Antalgic                      Objective measurements completed on examination: See above findings.               PT Education - 05/06/20 1843    Education Details quad sets, heel slides, hip abduction, LAQ all in sitting    Person(s) Educated Patient    Methods Explanation;Demonstration;Handout    Comprehension Verbalized understanding               PT Long Term Goals - 05/06/20 1849      PT LONG TERM GOAL #1   Title Patient will be independent with HEP and its progression.    Time 4    Period Weeks    Status New      PT LONG TERM GOAL #2   Title Patient will demonstrate 4/5 bilateral LE MMT in all planes to improve stability during functional tasks.    Time 4    Period Weeks    Status New      PT LONG TERM GOAL #3   Title Patient will decrease risk of falls and improve functional LE strength as noted by the ability to perform 5x sit to stand test with UE support in 25 seconds or less.    Baseline --    Time 4     Period Weeks    Status New      PT LONG TERM GOAL #4   Title Patient will report ability to perform ADLs with bilateral knee pain less than or equal to 4/10.    Time 4    Period Weeks    Status New      PT LONG TERM GOAL #5   Title Patient will report ability to stand and walk for 20 mins or greater to improve ability to perform ADLs and home tasks.    Time 4    Period Weeks    Status New                  Plan - 05/06/20 1848    Clinical Impression Statement Patient is a 60 year old female who presents the physical therapy with chronic bilateral knee pain left greater than right. Patient with decreased bilateral knee range of motion, localized edema, and decreased balance. Patient 5x sit to stand test with UE support time of 31.2 seconds categorizes her as a fall risk with decreased functional LE strength. Patient with normal patella mobility but with audible crepitus with AROM and PROM, bilaterally. Patient  ambulates without an AD and with decreased bilateral step lengths, decreased left stance time, and decreased left weight bearing. Patient and PT discussed HEP and POC to which the patient reported understanding. Patient would benefit from skilled physical therapy to address deficits and patient goals.    Personal Factors and Comorbidities Age;Comorbidity 2;Time since onset of injury/illness/exacerbation    Comorbidities latex and tape allergy, anxiety, depression, HTN, migraines, lumbar fusion, history of ulnar nerve repair, history of bilateral foot surgery    Examination-Activity Limitations Locomotion Level;Transfers;Stairs;Stand;Squat    Examination-Participation Restrictions Cleaning;Meal Prep    Stability/Clinical Decision Making Stable/Uncomplicated    Clinical Decision Making Low    Rehab Potential Good    PT Frequency 2x / week    PT Duration 4 weeks    PT Treatment/Interventions ADLs/Self Care Home Management;Cryotherapy;Electrical Stimulation;Iontophoresis 4mg /ml  Dexamethasone;Moist Heat;Ultrasound;Gait training;Stair training;Functional mobility training;Therapeutic activities;Therapeutic exercise;Balance training;Neuromuscular re-education;Manual techniques;Passive range of motion;Patient/family education;Vasopneumatic Device    PT Next Visit Plan nustep, pain free LE strengthening and ROM, stretching, balance activities, modalities PRN for pain relief.    PT Home Exercise Plan see patient education section    Consulted and Agree with Plan of Care Patient           Patient will benefit from skilled therapeutic intervention in order to improve the following deficits and impairments:  Abnormal gait,Difficulty walking,Decreased range of motion,Decreased activity tolerance,Decreased balance,Decreased strength,Increased edema,Pain  Visit Diagnosis: Chronic pain of left knee - Plan: PT plan of care cert/re-cert  Chronic pain of right knee - Plan: PT plan of care cert/re-cert  Muscle weakness (generalized) - Plan: PT plan of care cert/re-cert     Problem List Patient Active Problem List   Diagnosis Date Noted  . Lumbar stenosis with neurogenic claudication 08/14/2019  . S/P lumbar fusion 08/14/2019  . Left-sided Bell's palsy 04/01/2019  . Numbness of tongue 04/01/2019  . GERD 01/19/2010  . CONSTIPATION, CHRONIC 01/19/2010  . ABDOMINAL PAIN 01/19/2010    Gabriela Eves, PT, DPT 05/06/2020, 7:30 PM  [facility] [address] [place], [place], [address] Phone: [phone]   Fax:  [phone]  Name: SAMARIYA ROCKHOLD MRN: [id number] Date of Birth: Oct 16, 1960

## 2020-05-07 ENCOUNTER — Other Ambulatory Visit: Payer: Self-pay

## 2020-05-07 MED ORDER — FAMOTIDINE 40 MG PO TABS: 40.0000 mg | ORAL_TABLET | Freq: Two times a day (BID) | ORAL | 3 refills | Status: DC

## 2020-05-07 MED ORDER — RABEPRAZOLE SODIUM 20 MG PO TBEC: 20.0000 mg | DELAYED_RELEASE_TABLET | Freq: Two times a day (BID) | ORAL | 3 refills | Status: DC

## 2020-05-11 ENCOUNTER — Ambulatory Visit: Payer: BC Managed Care – PPO | Admitting: Physical Therapy

## 2020-05-11 ENCOUNTER — Other Ambulatory Visit: Payer: Self-pay

## 2020-05-11 DIAGNOSIS — M6281 Muscle weakness (generalized): Secondary | ICD-10-CM

## 2020-05-11 DIAGNOSIS — M25562 Pain in left knee: Secondary | ICD-10-CM | POA: Diagnosis not present

## 2020-05-11 DIAGNOSIS — G8929 Other chronic pain: Secondary | ICD-10-CM

## 2020-05-11 NOTE — Therapy (Signed)
[facility] [place], [place], [address] Phone: [phone]   Fax:  [phone]  Physical Therapy Treatment  Patient Details  Name: Elizabeth Cohen MRN: [id number] Date of Birth: [birth year] Referring Provider (PT): Netta Cedars, MD   Encounter Date: 05/11/2020   PT End of Session - 05/11/20 0942    Visit Number 2    Number of Visits 8    Date for PT Re-Evaluation 06/10/20    Authorization Type BCBS; Progress note every 10th visit    PT Start Time 0900    PT Stop Time 0944    PT Time Calculation (min) 44 min    Activity Tolerance Patient tolerated treatment well    Behavior During Therapy [facility] for tasks assessed/performed           Past Medical History:  Diagnosis Date  . Acid reflux   . Anxiety    OCD  . Bell's palsy 03/2019  . Cancer ([place])    Skin right arm and left ear, left breast  . Complication of anesthesia    slow to wake up during wisdom teeth ext, no other problems with the other surgery  . Depression   . 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 Hypertension   . Migraine   . Positive ANA (antinuclear antibody)    no problems, no meds    Past Surgical History:  Procedure Laterality Date  . ABDOMINAL HYSTERECTOMY    . APPENDECTOMY  2002  . BACK SURGERY  07/25/2019   Laminectomy and poraminotomy lumbar @ [facility] [place]  . CARPAL TUNNEL RELEASE  2015  . CHOLECYSTECTOMY  2004  . COLONOSCOPY  10/04/2010   Procedure: COLONOSCOPY;  Surgeon: Daneil Dolin, MD;  [facility];  Service: Endoscopy;  Laterality: N/A;  8:15AM  . COLONOSCOPY WITH PROPOFOL N/A 12/26/2019   Procedure: COLONOSCOPY WITH PROPOFOL;  Surgeon: Daneil Dolin, MD;  [facility];  Service: Endoscopy;  Laterality: N/A;  12:30pm  . ESOPHAGOGASTRODUODENOSCOPY (EGD) WITH PROPOFOL N/A 12/26/2019   Procedure: ESOPHAGOGASTRODUODENOSCOPY (EGD) WITH  PROPOFOL;  Surgeon: Daneil Dolin, MD;  [facility];  Service: Endoscopy;  Laterality: N/A;  . EXTERNAL EAR SURGERY Left 01/2018   Skin cancer  . FOOT SURGERY Bilateral 2018  . POLYPECTOMY  12/26/2019   Procedure: POLYPECTOMY;  Surgeon: Daneil Dolin, MD;  [facility];  Service: Endoscopy;;  . SKIN CANCER EXCISION Right 09/20/2018   Right arm  . TENDON REPAIR    . ULNAR NERVE REPAIR    . UPPER GI ENDOSCOPY    . WISDOM TOOTH EXTRACTION      There were no vitals filed for this visit.   Subjective Assessment - 05/11/20 0909    Subjective COVID-19 screening performed upon arrival. Patient arrives with more swelling and pain in L knee compared to R.    Pertinent History latex and tape allergy, anxiety, depression, HTN, migraines, lumbar fusion, history of ulnar nerve repair, history of bilateral foot surgery, history of bells palsy    Limitations Sitting;Standing;Walking;House hold activities    How long can you sit comfortably? "short periods"    How long can you stand comfortably? "<10 mins"    How long can you walk comfortably? "< 10 mins"    Diagnostic tests x-ray: arthritis    Patient Stated Goals decrease pain  Currently in Pain? Yes    Pain Score 5     Pain Location Knee    Pain Orientation Left    Pain Descriptors / Indicators Sore;Throbbing    Pain Type Chronic pain    Pain Onset More than a month ago    Pain Frequency Constant    Multiple Pain Sites Yes    Pain Score --   "minimal"   Pain Location Knee    Pain Orientation Right    Pain Descriptors / Indicators Aching    Pain Type Chronic pain    Pain Onset More than a month ago    Pain Frequency Constant              [facility] PT Assessment - 05/11/20 0001      Assessment   Medical Diagnosis Pain in right knee, Pain in left knee    Referring Provider (PT) Netta Cedars, MD    Next MD Visit "6 weeks"    Prior Therapy no      Precautions   Precautions Fall      Restrictions    Weight Bearing Restrictions No                         [facility] Adult PT Treatment/Exercise - 05/11/20 0001      Exercises   Exercises Knee/Hip      Knee/Hip Exercises: Aerobic   Nustep Level 3 x10 mins      Knee/Hip Exercises: Standing   Forward Lunges Both;1 set;10 reps;3 seconds    Forward Lunges Limitations on 6" step, 3" hold    Hip Abduction AROM;Both;1 set;10 reps    Rocker Board 3 minutes      Knee/Hip Exercises: Supine   Bridges Limitations attempted but terminated secondary to LBP    Other Supine Knee/Hip Exercises clamshells yellow theraband 3" hold 2x10    Other Supine Knee/Hip Exercises marching with yellow theraband 2x10; ball squeeze and glute squeezes 3" hold 2x10      Modalities   Modalities Vasopneumatic      Vasopneumatic   Number Minutes Vasopneumatic  15 minutes    Vasopnuematic Location  Knee   bilateraal   Vasopneumatic Pressure Low    Vasopneumatic Temperature  34 for edema                       PT Long Term Goals - 05/06/20 1849      PT LONG TERM GOAL #1   Title Patient will be independent with HEP and its progression.    Time 4    Period Weeks    Status New      PT LONG TERM GOAL #2   Title Patient will demonstrate 4/5 bilateral LE MMT in all planes to improve stability during functional tasks.    Time 4    Period Weeks    Status New      PT LONG TERM GOAL #3   Title Patient will decrease risk of falls and improve functional LE strength as noted by the ability to perform 5x sit to stand test with UE support in 25 seconds or less.    Baseline --    Time 4    Period Weeks    Status New      PT LONG TERM GOAL #4   Title Patient will report ability to perform ADLs with bilateral knee pain less than or equal to 4/10.    Time  4    Period Weeks    Status New      PT LONG TERM GOAL #5   Title Patient will report ability to stand and walk for 20 mins or greater to improve ability to perform ADLs and home tasks.     Time 4    Period Weeks    Status New                 Plan - 05/11/20 0914    Clinical Impression Statement Patient arrived to physical therapy with more L knee pain than right. Patient was able to tolerate treatment fairly well but with some more discomfort with R hip abduction TE while standing on L LE. Patient was unable to perform bridges secondary to low back pain therefore glute sets with ball squeeze performed. Vasopnumatic device performed to bilateral knees with no adverse affects.    Personal Factors and Comorbidities Age;Comorbidity 2;Time since onset of injury/illness/exacerbation    Comorbidities latex and tape allergy, anxiety, depression, HTN, migraines, lumbar fusion, history of ulnar nerve repair, history of bilateral foot surgery    Examination-Activity Limitations Locomotion Level;Transfers;Stairs;Stand;Squat    Examination-Participation Restrictions Cleaning;Meal Prep    Stability/Clinical Decision Making Stable/Uncomplicated    Clinical Decision Making Low    Rehab Potential Good    PT Frequency 2x / week    PT Duration 4 weeks    PT Treatment/Interventions ADLs/Self Care Home Management;Cryotherapy;Iontophoresis 4mg /ml Dexamethasone;Moist Heat;Ultrasound;Gait training;Stair training;Functional mobility training;Therapeutic activities;Therapeutic exercise;Balance training;Neuromuscular re-education;Manual techniques;Passive range of motion;Patient/family education;Vasopneumatic Device    PT Next Visit Plan nustep, pain free LE strengthening and ROM, stretching, balance activities, vaso only, allergic to e-stim pads    PT Home Exercise Plan see patient education section    Consulted and Agree with Plan of Care Patient           Patient will benefit from skilled therapeutic intervention in order to improve the following deficits and impairments:  Abnormal gait,Difficulty walking,Decreased range of motion,Decreased activity tolerance,Decreased balance,Decreased  strength,Increased edema,Pain  Visit Diagnosis: Chronic pain of left knee  Chronic pain of right knee  Muscle weakness (generalized)     Problem List Patient Active Problem List   Diagnosis Date Noted  . Lumbar stenosis with neurogenic claudication 08/14/2019  . S/P lumbar fusion 08/14/2019  . Left-sided Bell's palsy 04/01/2019  . Numbness of tongue 04/01/2019  . GERD 01/19/2010  . CONSTIPATION, CHRONIC 01/19/2010  . ABDOMINAL PAIN 01/19/2010    Gabriela Eves, PT, DPT 05/11/2020, 9:51 AM  [facility] [address] [place], [place], [address] Phone: [phone]   Fax:  [phone]  Name: PAISLYN DOMENICO MRN: [id number] Date of Birth: December 15, 1960

## 2020-05-13 ENCOUNTER — Other Ambulatory Visit: Payer: Self-pay

## 2020-05-13 ENCOUNTER — Ambulatory Visit: Payer: BC Managed Care – PPO | Admitting: Physical Therapy

## 2020-05-13 DIAGNOSIS — M25561 Pain in right knee: Secondary | ICD-10-CM

## 2020-05-13 DIAGNOSIS — M25562 Pain in left knee: Secondary | ICD-10-CM | POA: Diagnosis not present

## 2020-05-13 DIAGNOSIS — M6281 Muscle weakness (generalized): Secondary | ICD-10-CM

## 2020-05-13 DIAGNOSIS — G8929 Other chronic pain: Secondary | ICD-10-CM

## 2020-05-13 NOTE — Therapy (Signed)
[facility] [place], [place], [address] Phone: [phone]   Fax:  [phone]  Physical Therapy Treatment  Patient Details  Name: Elizabeth Cohen MRN: [id number] Date of Birth: [birth year] Referring Provider (PT): Netta Cedars, MD   Encounter Date: 05/13/2020   PT End of Session - 05/13/20 0952    Visit Number 3    Number of Visits 8    Date for PT Re-Evaluation 06/10/20    Authorization Type BCBS; Progress note every 10th visit    PT Start Time 0900    PT Stop Time 0949    PT Time Calculation (min) 49 min    Activity Tolerance Patient tolerated treatment well    Behavior During Therapy [facility] for tasks assessed/performed           Past Medical History:  Diagnosis Date  . Acid reflux   . Anxiety    OCD  . Bell's palsy 03/2019  . Cancer ([place])    Skin right arm and left ear, left breast  . Complication of anesthesia    slow to wake up during wisdom teeth ext, no other problems with the other surgery  . Depression   . 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 Hypertension   . Migraine   . Positive ANA (antinuclear antibody)    no problems, no meds    Past Surgical History:  Procedure Laterality Date  . ABDOMINAL HYSTERECTOMY    . APPENDECTOMY  2002  . BACK SURGERY  07/25/2019   Laminectomy and poraminotomy lumbar @ [facility] [place]  . CARPAL TUNNEL RELEASE  2015  . CHOLECYSTECTOMY  2004  . COLONOSCOPY  10/04/2010   Procedure: COLONOSCOPY;  Surgeon: Daneil Dolin, MD;  [facility];  Service: Endoscopy;  Laterality: N/A;  8:15AM  . COLONOSCOPY WITH PROPOFOL N/A 12/26/2019   Procedure: COLONOSCOPY WITH PROPOFOL;  Surgeon: Daneil Dolin, MD;  [facility];  Service: Endoscopy;  Laterality: N/A;  12:30pm  . ESOPHAGOGASTRODUODENOSCOPY (EGD) WITH PROPOFOL N/A 12/26/2019   Procedure: ESOPHAGOGASTRODUODENOSCOPY (EGD) WITH  PROPOFOL;  Surgeon: Daneil Dolin, MD;  [facility];  Service: Endoscopy;  Laterality: N/A;  . EXTERNAL EAR SURGERY Left 01/2018   Skin cancer  . FOOT SURGERY Bilateral 2018  . POLYPECTOMY  12/26/2019   Procedure: POLYPECTOMY;  Surgeon: Daneil Dolin, MD;  [facility];  Service: Endoscopy;;  . SKIN CANCER EXCISION Right 09/20/2018   Right arm  . TENDON REPAIR    . ULNAR NERVE REPAIR    . UPPER GI ENDOSCOPY    . WISDOM TOOTH EXTRACTION      There were no vitals filed for this visit.   Subjective Assessment - 05/13/20 0905    Subjective COVID-19 screening performed upon arrival. Patient reported pain and tightness upon arrival.    Pertinent History latex and tape allergy, anxiety, depression, HTN, migraines, lumbar fusion, history of ulnar nerve repair, history of bilateral foot surgery, history of bells palsy    Limitations Sitting;Standing;Walking;House hold activities    How long can you sit comfortably? "short periods"    How long can you stand comfortably? "<10 mins"    How long can you walk comfortably? "< 10 mins"    Diagnostic tests x-ray: arthritis    Patient Stated Goals decrease pain    Currently in Pain? Yes  Pain Score 6     Pain Location Knee    Pain Orientation Right;Left    Pain Descriptors / Indicators Discomfort;Tightness    Pain Type Chronic pain    Pain Onset More than a month ago    Pain Frequency Constant    Aggravating Factors  prolong standing/walking    Pain Relieving Factors rest                             [facility] Adult PT Treatment/Exercise - 05/13/20 0001      Knee/Hip Exercises: Stretches   Piriformis Stretch Both;3 reps;20 seconds    Other Knee/Hip Stretches standing hip flexor stretch bl x3 reps 20 sec each side      Knee/Hip Exercises: Aerobic   Nustep Level 3 x10 mins      Knee/Hip Exercises: Standing   Hip Extension Stengthening;Both;AROM;2 sets;10 reps;Knee straight    Rocker Board 3  minutes      Knee/Hip Exercises: Supine   Straight Leg Raises Strengthening;Both;2 sets;10 reps    Other Supine Knee/Hip Exercises clamshells red theraband 3" hold 2x10      Knee/Hip Exercises: Sidelying   Hip ABduction Strengthening;Both;20 reps      Vasopneumatic   Number Minutes Vasopneumatic  15 minutes    Vasopnuematic Location  Knee   Bil   Vasopneumatic Pressure Low    Vasopneumatic Temperature  34 for edema                       PT Long Term Goals - 05/13/20 0953      PT LONG TERM GOAL #1   Title Patient will be independent with HEP and its progression.    Time 4    Period Weeks    Status On-going      PT LONG TERM GOAL #2   Title Patient will demonstrate 4/5 bilateral LE MMT in all planes to improve stability during functional tasks.    Time 4    Period Weeks    Status On-going      PT LONG TERM GOAL #3   Title Patient will decrease risk of falls and improve functional LE strength as noted by the ability to perform 5x sit to stand test with UE support in 25 seconds or less.    Time 4    Period Weeks    Status On-going      PT LONG TERM GOAL #4   Title Patient will report ability to perform ADLs with bilateral knee pain less than or equal to 4/10.    Baseline 6/10 pain today 05/13/20    Time 4    Period Weeks    Status On-going      PT LONG TERM GOAL #5   Title Patient will report ability to stand and walk for 20 mins or greater to improve ability to perform ADLs and home tasks.    Baseline 6/10 pain 05/13/20    Time 4    Period Weeks    Status On-going                 Plan - 05/13/20 0953    Clinical Impression Statement Patient tolerated treatment well today. Patient continues to report 6/10 pain in bil knees. Patient able to perform bil LE strengthening and stretches today with no increased discomfort. Focused on slow progression to avoid any increased pain. Patient current goals ongoing due to pain limitations.  Personal Factors  and Comorbidities Age;Comorbidity 2;Time since onset of injury/illness/exacerbation    Comorbidities latex and tape allergy, anxiety, depression, HTN, migraines, lumbar fusion, history of ulnar nerve repair, history of bilateral foot surgery    Examination-Activity Limitations Locomotion Level;Transfers;Stairs;Stand;Squat    Examination-Participation Restrictions Cleaning;Meal Prep    Stability/Clinical Decision Making Stable/Uncomplicated    Rehab Potential Good    PT Frequency 2x / week    PT Duration 4 weeks    PT Treatment/Interventions ADLs/Self Care Home Management;Cryotherapy;Iontophoresis 4mg /ml Dexamethasone;Moist Heat;Ultrasound;Gait training;Stair training;Functional mobility training;Therapeutic activities;Therapeutic exercise;Balance training;Neuromuscular re-education;Manual techniques;Passive range of motion;Patient/family education;Vasopneumatic Device    PT Next Visit Plan nustep, pain free LE strengthening and ROM, stretching, balance activities, vaso only, allergic to e-stim pads    Consulted and Agree with Plan of Care Patient           Patient will benefit from skilled therapeutic intervention in order to improve the following deficits and impairments:  Abnormal gait,Difficulty walking,Decreased range of motion,Decreased activity tolerance,Decreased balance,Decreased strength,Increased edema,Pain  Visit Diagnosis: Chronic pain of left knee  Chronic pain of right knee  Muscle weakness (generalized)     Problem List Patient Active Problem List   Diagnosis Date Noted  . Lumbar stenosis with neurogenic claudication 08/14/2019  . S/P lumbar fusion 08/14/2019  . Left-sided Bell's palsy 04/01/2019  . Numbness of tongue 04/01/2019  . GERD 01/19/2010  . CONSTIPATION, CHRONIC 01/19/2010  . ABDOMINAL PAIN 01/19/2010    Phillips Climes, PTA 05/13/2020, 9:56 AM  [facility] [address] [place], [place],  [address] Phone: [phone]   Fax:  [phone]  Name: Elizabeth Cohen MRN: [id number] Date of Birth: [birth year]

## 2020-05-18 ENCOUNTER — Ambulatory Visit: Payer: BC Managed Care – PPO | Admitting: Physical Therapy

## 2020-05-20 ENCOUNTER — Other Ambulatory Visit: Payer: Self-pay

## 2020-05-20 ENCOUNTER — Ambulatory Visit: Payer: BC Managed Care – PPO | Attending: Orthopedic Surgery | Admitting: Physical Therapy

## 2020-05-20 DIAGNOSIS — M6281 Muscle weakness (generalized): Secondary | ICD-10-CM | POA: Diagnosis present

## 2020-05-20 DIAGNOSIS — G8929 Other chronic pain: Secondary | ICD-10-CM | POA: Insufficient documentation

## 2020-05-20 DIAGNOSIS — M25561 Pain in right knee: Secondary | ICD-10-CM | POA: Insufficient documentation

## 2020-05-20 DIAGNOSIS — M25562 Pain in left knee: Secondary | ICD-10-CM | POA: Diagnosis not present

## 2020-05-20 NOTE — Therapy (Signed)
[facility] [place], [place], [address] Phone: [phone]   Fax:  [phone]  Physical Therapy Treatment  Patient Details  Name: Elizabeth Cohen MRN: [id number] Date of Birth: April 06, 1960 Referring Provider (PT): Netta Cedars, MD   Encounter Date: 05/20/2020   PT End of Session - 05/20/20 0904    Visit Number 4    Number of Visits 8    Date for PT Re-Evaluation 06/10/20    Authorization Type BCBS; Progress note every 10th visit    PT Start Time 0900    PT Stop Time 0948    PT Time Calculation (min) 48 min    Activity Tolerance Patient tolerated treatment well    Behavior During Therapy [facility] for tasks assessed/performed           Past Medical History:  Diagnosis Date  . Acid reflux   . Anxiety    OCD  . Bell's palsy 03/2019  . Cancer ([place])    Skin right arm and left ear, left breast  . Complication of anesthesia    slow to wake up during wisdom teeth ext, no other problems with the other surgery  . Depression   . 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 Hypertension   . Migraine   . Positive ANA (antinuclear antibody)    no problems, no meds    Past Surgical History:  Procedure Laterality Date  . ABDOMINAL HYSTERECTOMY    . APPENDECTOMY  2002  . BACK SURGERY  07/25/2019   Laminectomy and poraminotomy lumbar @ [facility]  . CARPAL TUNNEL RELEASE  2015  . CHOLECYSTECTOMY  2004  . COLONOSCOPY  10/04/2010   Procedure: COLONOSCOPY;  Surgeon: Daneil Dolin, MD;  [facility];  Service: Endoscopy;  Laterality: N/A;  8:15AM  . COLONOSCOPY WITH PROPOFOL N/A 12/26/2019   Procedure: COLONOSCOPY WITH PROPOFOL;  Surgeon: Daneil Dolin, MD;  [facility];  Service: Endoscopy;  Laterality: N/A;  12:30pm  . ESOPHAGOGASTRODUODENOSCOPY (EGD) WITH PROPOFOL N/A 12/26/2019   Procedure: ESOPHAGOGASTRODUODENOSCOPY (EGD) WITH  PROPOFOL;  Surgeon: Daneil Dolin, MD;  [facility];  Service: Endoscopy;  Laterality: N/A;  . EXTERNAL EAR SURGERY Left 01/2018   Skin cancer  . FOOT SURGERY Bilateral 2018  . POLYPECTOMY  12/26/2019   Procedure: POLYPECTOMY;  Surgeon: Daneil Dolin, MD;  [facility];  Service: Endoscopy;;  . SKIN CANCER EXCISION Right 09/20/2018   Right arm  . TENDON REPAIR    . ULNAR NERVE REPAIR    . UPPER GI ENDOSCOPY    . WISDOM TOOTH EXTRACTION      There were no vitals filed for this visit.   Subjective Assessment - 05/20/20 0902    Subjective COVID-19 screening performed upon arrival. Patient arrived with increased pain in knees for unknown reason    Pertinent History latex and tape allergy, anxiety, depression, HTN, migraines, lumbar fusion, history of ulnar nerve repair, history of bilateral foot surgery, history of bells palsy    Limitations Sitting;Standing;Walking;House hold activities    How long can you sit comfortably? "short periods"    How long can you stand comfortably? "<10 mins"    How long can you walk comfortably? "< 10 mins"    Diagnostic tests x-ray: arthritis    Patient Stated Goals decrease pain    Currently in Pain?  Yes    Pain Score 7     Pain Location Knee    Pain Orientation Right;Left    Pain Descriptors / Indicators Discomfort;Tightness    Pain Type Chronic pain    Pain Onset More than a month ago    Pain Frequency Constant    Aggravating Factors  prolong activity    Pain Relieving Factors at rest                             [facility] Adult PT Treatment/Exercise - 05/20/20 0001      Knee/Hip Exercises: Aerobic   Nustep Level  x10 mins      Knee/Hip Exercises: Standing   Rocker Board 2 minutes      Knee/Hip Exercises: Supine   Terminal Knee Extension Strengthening;Right;Left;20 reps;Theraband    Theraband Level (Terminal Knee Extension) Level 2 (Red)    Straight Leg Raises Strengthening;Both;2 sets;10 reps     Other Supine Knee/Hip Exercises clamshells red theraband 3" hold x30      Knee/Hip Exercises: Sidelying   Hip ABduction Strengthening;Both;20 reps    Clams x20 bil LE      Vasopneumatic   Number Minutes Vasopneumatic  15 minutes    Vasopnuematic Location  Knee   BIL   Vasopneumatic Pressure Low    Vasopneumatic Temperature  34 for edema                       PT Long Term Goals - 05/20/20 0905      PT LONG TERM GOAL #1   Title Patient will be independent with HEP and its progression.    Time 4    Period Weeks    Status On-going      PT LONG TERM GOAL #2   Title Patient will demonstrate 4/5 bilateral LE MMT in all planes to improve stability during functional tasks.    Time 4    Period Weeks    Status On-going      PT LONG TERM GOAL #3   Title Patient will decrease risk of falls and improve functional LE strength as noted by the ability to perform 5x sit to stand test with UE support in 25 seconds or less.    Time 4    Period Weeks    Status On-going      PT LONG TERM GOAL #4   Title Patient will report ability to perform ADLs with bilateral knee pain less than or equal to 4/10.    Baseline 7/10 pain today 05/20/20    Time 4    Period Weeks    Status On-going      PT LONG TERM GOAL #5   Title Patient will report ability to stand and walk for 20 mins or greater to improve ability to perform ADLs and home tasks.    Baseline 7/10 pain 05/13/20    Time 4    Period Weeks    Status On-going                 Plan - 05/20/20 0936    Clinical Impression Statement Patient tolerated treatment fair today due to pain in bil knees. Patient able to perfrom low level exercises today in supine and sitting. Patient continues to have limitations with ADL's due to pain. Current goals ongoing this week.    Personal Factors and Comorbidities Age;Comorbidity 2;Time since onset of injury/illness/exacerbation    Comorbidities latex  and tape allergy, anxiety, depression, HTN,  migraines, lumbar fusion, history of ulnar nerve repair, history of bilateral foot surgery    Examination-Activity Limitations Locomotion Level;Transfers;Stairs;Stand;Squat    Examination-Participation Restrictions Cleaning;Meal Prep    Stability/Clinical Decision Making Stable/Uncomplicated    Rehab Potential Good    PT Frequency 2x / week    PT Duration 4 weeks    PT Treatment/Interventions ADLs/Self Care Home Management;Cryotherapy;Iontophoresis 4mg /ml Dexamethasone;Moist Heat;Ultrasound;Gait training;Stair training;Functional mobility training;Therapeutic activities;Therapeutic exercise;Balance training;Neuromuscular re-education;Manual techniques;Passive range of motion;Patient/family education;Vasopneumatic Device    PT Next Visit Plan nustep, pain free LE strengthening and ROM, stretching, balance activities, vaso only, allergic to e-stim pads    Consulted and Agree with Plan of Care Patient           Patient will benefit from skilled therapeutic intervention in order to improve the following deficits and impairments:  Abnormal gait,Difficulty walking,Decreased range of motion,Decreased activity tolerance,Decreased balance,Decreased strength,Increased edema,Pain  Visit Diagnosis: Chronic pain of left knee  Chronic pain of right knee  Muscle weakness (generalized)     Problem List Patient Active Problem List   Diagnosis Date Noted  . Lumbar stenosis with neurogenic claudication 08/14/2019  . S/P lumbar fusion 08/14/2019  . Left-sided Bell's palsy 04/01/2019  . Numbness of tongue 04/01/2019  . GERD 01/19/2010  . CONSTIPATION, CHRONIC 01/19/2010  . ABDOMINAL PAIN 01/19/2010    Phillips Climes, PTA 05/20/2020, 9:51 AM  [facility] [place], [place], [address] Phone: [phone]   Fax:  [phone]  Name: SHANIRA TINE MRN: [id number] Date of Birth: 06-16-1960

## 2020-05-27 ENCOUNTER — Ambulatory Visit: Payer: BC Managed Care – PPO | Admitting: Physical Therapy

## 2020-05-27 ENCOUNTER — Other Ambulatory Visit: Payer: Self-pay

## 2020-05-27 ENCOUNTER — Encounter: Payer: Self-pay | Admitting: Physical Therapy

## 2020-05-27 DIAGNOSIS — G8929 Other chronic pain: Secondary | ICD-10-CM

## 2020-05-27 DIAGNOSIS — M25561 Pain in right knee: Secondary | ICD-10-CM

## 2020-05-27 DIAGNOSIS — M25562 Pain in left knee: Secondary | ICD-10-CM | POA: Diagnosis not present

## 2020-05-27 DIAGNOSIS — M6281 Muscle weakness (generalized): Secondary | ICD-10-CM

## 2020-05-27 NOTE — Therapy (Signed)
[facility] [place], [place], [address] Phone: [phone]   Fax:  [phone]  Physical Therapy Treatment  Patient Details  Name: Elizabeth Cohen MRN: [id number] Date of Birth: [birth year] Referring Provider (PT): Netta Cedars, MD   Encounter Date: 05/27/2020   PT End of Session - 05/27/20 0903    Visit Number 5    Number of Visits 8    Date for PT Re-Evaluation 06/10/20    Authorization Type BCBS; Progress note every 10th visit    PT Start Time 0900    PT Stop Time 0943    PT Time Calculation (min) 43 min    Activity Tolerance Patient tolerated treatment well    Behavior During Therapy [facility] for tasks assessed/performed           Past Medical History:  Diagnosis Date  . Acid reflux   . Anxiety    OCD  . Bell's palsy 03/2019  . Cancer ([place])    Skin right arm and left ear, left breast  . Complication of anesthesia    slow to wake up during wisdom teeth ext, no other problems with the other surgery  . Depression   . 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 Hypertension   . Migraine   . Positive ANA (antinuclear antibody)    no problems, no meds    Past Surgical History:  Procedure Laterality Date  . ABDOMINAL HYSTERECTOMY    . APPENDECTOMY  2002  . BACK SURGERY  07/25/2019   Laminectomy and poraminotomy lumbar @ [facility] [place]  . CARPAL TUNNEL RELEASE  2015  . CHOLECYSTECTOMY  2004  . COLONOSCOPY  10/04/2010   Procedure: COLONOSCOPY;  Surgeon: Daneil Dolin, MD;  [facility];  Service: Endoscopy;  Laterality: N/A;  8:15AM  . COLONOSCOPY WITH PROPOFOL N/A 12/26/2019   Procedure: COLONOSCOPY WITH PROPOFOL;  Surgeon: Daneil Dolin, MD;  [facility];  Service: Endoscopy;  Laterality: N/A;  12:30pm  . ESOPHAGOGASTRODUODENOSCOPY (EGD) WITH PROPOFOL N/A 12/26/2019   Procedure: ESOPHAGOGASTRODUODENOSCOPY (EGD) WITH  PROPOFOL;  Surgeon: Daneil Dolin, MD;  [facility];  Service: Endoscopy;  Laterality: N/A;  . EXTERNAL EAR SURGERY Left 01/2018   Skin cancer  . FOOT SURGERY Bilateral 2018  . POLYPECTOMY  12/26/2019   Procedure: POLYPECTOMY;  Surgeon: Daneil Dolin, MD;  [facility];  Service: Endoscopy;;  . SKIN CANCER EXCISION Right 09/20/2018   Right arm  . TENDON REPAIR    . ULNAR NERVE REPAIR    . UPPER GI ENDOSCOPY    . WISDOM TOOTH EXTRACTION      There were no vitals filed for this visit.   Subjective Assessment - 05/27/20 0901    Subjective COVID-19 screening performed upon arrival. Left knee stabbing pain on the inside.    Pertinent History latex and tape allergy, anxiety, depression, HTN, migraines, lumbar fusion, history of ulnar nerve repair, history of bilateral foot surgery, history of bells palsy    Limitations Sitting;Standing;Walking;House hold activities    How long can you sit comfortably? "short periods"    How long can you stand comfortably? "<10 mins"    How long can you walk comfortably? "< 10 mins"    Currently in Pain? Yes    Pain Score 8     Pain Location Knee    Pain  Orientation Left    Pain Descriptors / Indicators Sharp    Pain Score 4    Pain Location Knee    Pain Orientation Right    Pain Descriptors / Indicators Aching              [facility] PT Assessment - 05/27/20 0001      Strength   Right Hip Flexion 5/5    Right Hip ABduction 5/5    Left Hip Flexion 5/5    Left Hip ABduction 4-/5    Right Knee Flexion 5/5    Right Knee Extension 5/5    Left Knee Flexion 5/5    Left Knee Extension 5/5   5-/5                        [facility] Adult PT Treatment/Exercise - 05/27/20 0001      Knee/Hip Exercises: Aerobic   Nustep Level 4  x10 mins      Knee/Hip Exercises: Standing   Knee Flexion 20 reps;Both    Knee Flexion Limitations red band    Hip Flexion Both;20 reps;Knee straight    Hip Flexion Limitations red     Hip Abduction 20 reps;Knee straight    Abduction Limitations red    Hip Extension Both;20 reps;Knee straight    Extension Limitations red      Knee/Hip Exercises: Sidelying   Clams x20 bil LE   red x 10, green x 10 right; 20 green left     Manual Therapy   Manual Therapy Soft tissue mobilization    Soft tissue mobilization to left quads and hip ADDuctors                       PT Long Term Goals - 05/27/20 6384      PT LONG TERM GOAL #1   Title Patient will be independent with HEP and its progression.    Status On-going      PT LONG TERM GOAL #2   Title Patient will demonstrate 4/5 bilateral LE MMT in all planes to improve stability during functional tasks.    Status Partially Met      PT LONG TERM GOAL #3   Title Patient will decrease risk of falls and improve functional LE strength as noted by the ability to perform 5x sit to stand test with UE support in 11.4 seconds or less.    Baseline met initial goal of less than 25 sec with 18.77 sec    Status Revised                 Plan - 05/27/20 1518    Clinical Impression Statement Patient able to tolerate increased resistance with TE today without increased knee pain. Left clams were difficult with green band and red may be better choice for next visit. She tolerated manual therapy well to left ADD, quads and ITB. She would benefit from foam rolling and more manual here. She has progressed significantly with strength overall and met her 5x sit to stand which I revised to 11.4 seconds.    Personal Factors and Comorbidities Age;Comorbidity 2;Time since onset of injury/illness/exacerbation    Comorbidities latex and tape allergy, anxiety, depression, HTN, migraines, lumbar fusion, history of ulnar nerve repair, history of bilateral foot surgery    Examination-Activity Limitations Locomotion Level;Transfers;Stairs;Stand;Squat    PT Frequency 2x / week    PT Duration 4 weeks    PT Treatment/Interventions ADLs/Self  Care  Home Management;Cryotherapy;Iontophoresis 68m/ml Dexamethasone;Moist Heat;Ultrasound;Gait training;Stair training;Functional mobility training;Therapeutic activities;Therapeutic exercise;Balance training;Neuromuscular re-education;Manual techniques;Passive range of motion;Patient/family education;Vasopneumatic Device    PT Next Visit Plan nustep, pain free LE strengthening and ROM, stretching, balance activities, vaso only, allergic to e-stim pads    PT Home Exercise Plan see patient education section    Consulted and Agree with Plan of Care Patient           Patient will benefit from skilled therapeutic intervention in order to improve the following deficits and impairments:  Abnormal gait,Difficulty walking,Decreased range of motion,Decreased activity tolerance,Decreased balance,Decreased strength,Increased edema,Pain  Visit Diagnosis: Chronic pain of left knee  Chronic pain of right knee  Muscle weakness (generalized)     Problem List Patient Active Problem List   Diagnosis Date Noted  . Lumbar stenosis with neurogenic claudication 08/14/2019  . S/P lumbar fusion 08/14/2019  . Left-sided Bell's palsy 04/01/2019  . Numbness of tongue 04/01/2019  . GERD 01/19/2010  . CONSTIPATION, CHRONIC 01/19/2010  . ABDOMINAL PAIN 01/19/2010    Elizabeth FlavorsPT 05/27/2020, 3:24 PM  [facility] [address] [address]Phone: [phone]  Fax:  [phone] Name: Elizabeth RIGDONMRN: [id number] of Birth: 8Jun 22, 1962

## 2020-06-01 ENCOUNTER — Ambulatory Visit: Payer: BC Managed Care – PPO

## 2020-06-01 ENCOUNTER — Other Ambulatory Visit: Payer: Self-pay

## 2020-06-01 DIAGNOSIS — M25562 Pain in left knee: Secondary | ICD-10-CM

## 2020-06-01 DIAGNOSIS — G8929 Other chronic pain: Secondary | ICD-10-CM

## 2020-06-01 DIAGNOSIS — M6281 Muscle weakness (generalized): Secondary | ICD-10-CM

## 2020-06-01 NOTE — Therapy (Signed)
[facility] [employer], [place], [address] Phone: [phone]   Fax:  [phone]  Physical Therapy Treatment  Patient Details  Name: Elizabeth Cohen MRN: [id number] Date of Birth: 23-Mar-1960 Referring Provider (PT): Netta Cedars, MD   Encounter Date: 06/01/2020   PT End of Session - 06/01/20 0924    Visit Number 6    Number of Visits 8    Date for PT Re-Evaluation 06/10/20    Authorization Type BCBS; Progress note every 10th visit    PT Start Time 0900    PT Stop Time 0942    PT Time Calculation (min) 42 min    Activity Tolerance Patient tolerated treatment well    Behavior During Therapy [facility] for tasks assessed/performed           Past Medical History:  Diagnosis Date  . Acid reflux   . Anxiety    OCD  . Bell's palsy 03/2019  . Cancer ([place])    Skin right arm and left ear, left breast  . Complication of anesthesia    slow to wake up during wisdom teeth ext, no other problems with the other surgery  . Depression   . 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 Hypertension   . Migraine   . Positive ANA (antinuclear antibody)    no problems, no meds    Past Surgical History:  Procedure Laterality Date  . ABDOMINAL HYSTERECTOMY    . APPENDECTOMY  2002  . BACK SURGERY  07/25/2019   Laminectomy and poraminotomy lumbar @ [facility]  . CARPAL TUNNEL RELEASE  2015  . CHOLECYSTECTOMY  2004  . COLONOSCOPY  10/04/2010   Procedure: COLONOSCOPY;  Surgeon: Daneil Dolin, MD;  [facility];  Service: Endoscopy;  Laterality: N/A;  8:15AM  . COLONOSCOPY WITH PROPOFOL N/A 12/26/2019   Procedure: COLONOSCOPY WITH PROPOFOL;  Surgeon: Daneil Dolin, MD;  [facility];  Service: Endoscopy;  Laterality: N/A;  12:30pm  . ESOPHAGOGASTRODUODENOSCOPY (EGD) WITH PROPOFOL N/A 12/26/2019   Procedure: ESOPHAGOGASTRODUODENOSCOPY (EGD) WITH  PROPOFOL;  Surgeon: Daneil Dolin, MD;  [facility];  Service: Endoscopy;  Laterality: N/A;  . EXTERNAL EAR SURGERY Left 01/2018   Skin cancer  . FOOT SURGERY Bilateral 2018  . POLYPECTOMY  12/26/2019   Procedure: POLYPECTOMY;  Surgeon: Daneil Dolin, MD;  [facility];  Service: Endoscopy;;  . SKIN CANCER EXCISION Right 09/20/2018   Right arm  . TENDON REPAIR    . ULNAR NERVE REPAIR    . UPPER GI ENDOSCOPY    . WISDOM TOOTH EXTRACTION      There were no vitals filed for this visit.   Subjective Assessment - 06/01/20 0902    Subjective COVID-19 screening performed upon arrival. Left knee feels swollen on the inside, pain scale 8/10, Rt knee 5/10.    Pertinent History latex and tape allergy, anxiety, depression, HTN, migraines, lumbar fusion, history of ulnar nerve repair, history of bilateral foot surgery, history of bells palsy    Patient Stated Goals decrease pain    Currently in Pain? Yes    Pain Score 8     Pain Location Knee    Pain Orientation Left;Right   Bil knees sharp/achey   Pain Descriptors / Indicators Sharp;Aching    Pain Type Chronic pain    Pain Onset More  than a month ago    Pain Frequency Constant    Aggravating Factors  prolong activity    Pain Relieving Factors at rest    Effect of Pain on Daily Activities difficult to perforn daily tasts    Pain Score 5    Pain Location Knee    Pain Orientation Right    Pain Descriptors / Indicators Aching;Sharp    Pain Type Chronic pain                             [facility] Adult PT Treatment/Exercise - 06/01/20 0001      Knee/Hip Exercises: Stretches   Other Knee/Hip Stretches supine butterfly stretch 2x 30"      Knee/Hip Exercises: Aerobic   Nustep Level 4  x 79mns      Knee/Hip Exercises: Standing   Heel Raises 2 sets;10 reps    Heel Raises Limitations 5" holds    Rocker Board 2 minutes    Rocker Board Limitations lateral    SLS 2" max BLE    SLS with Vectors  3x5"intermittent HHA    Other Standing Knee Exercises sidestep with GTB on outside of pants around thigh 4RT      Manual Therapy   Manual Therapy Soft tissue mobilization    Manual therapy comments Manual complete separate than rest of tx    Soft tissue mobilization Lt quads, ADD and ITB                       PT Long Term Goals - 05/27/20 00737     PT LONG TERM GOAL #1   Title Patient will be independent with HEP and its progression.    Status On-going      PT LONG TERM GOAL #2   Title Patient will demonstrate 4/5 bilateral LE MMT in all planes to improve stability during functional tasks.    Status Partially Met      PT LONG TERM GOAL #3   Title Patient will decrease risk of falls and improve functional LE strength as noted by the ability to perform 5x sit to stand test with UE support in 11.4 seconds or less.    Baseline met initial goal of less than 25 sec with 18.77 sec    Status Revised                 Plan - 06/01/20 0925    Clinical Impression Statement Added balance activities for gluteal activaiton with good tolerance, did require cueing for posture and abdominal activation to reduce sidebending during abduction activities and min cueing to reduce ER.  EOS with STM to address tightness, reduce spasm in ADD, quad and ITB and decognestive techniques for edema control proximal knee with reports of pain reduced to 4/10.  Added butterfly stretch to HEP to address adduction tightness.    Personal Factors and Comorbidities Age;Comorbidity 2;Time since onset of injury/illness/exacerbation    Comorbidities latex and tape allergy, anxiety, depression, HTN, migraines, lumbar fusion, history of ulnar nerve repair, history of bilateral foot surgery    Examination-Activity Limitations Locomotion Level;Transfers;Stairs;Stand;Squat    Examination-Participation Restrictions Cleaning;Meal Prep    Stability/Clinical Decision Making Stable/Uncomplicated    Clinical Decision  Making Low    Rehab Potential Good    PT Frequency 2x / week    PT Duration 4 weeks    PT Treatment/Interventions ADLs/Self Care Home Management;Cryotherapy;Iontophoresis 439mml Dexamethasone;Moist Heat;Ultrasound;Gait training;Stair training;Functional  mobility training;Therapeutic activities;Therapeutic exercise;Balance training;Neuromuscular re-education;Manual techniques;Passive range of motion;Patient/family education;Vasopneumatic Device    PT Next Visit Plan nustep, pain free LE strengthening and ROM, stretching, balance activities, vaso only, allergic to e-stim pads    Consulted and Agree with Plan of Care Patient           Patient will benefit from skilled therapeutic intervention in order to improve the following deficits and impairments:  Abnormal gait,Difficulty walking,Decreased range of motion,Decreased activity tolerance,Decreased balance,Decreased strength,Increased edema,Pain  Visit Diagnosis: Chronic pain of left knee  Chronic pain of right knee  Muscle weakness (generalized)     Problem List Patient Active Problem List   Diagnosis Date Noted  . Lumbar stenosis with neurogenic claudication 08/14/2019  . S/P lumbar fusion 08/14/2019  . Left-sided Bell's palsy 04/01/2019  . Numbness of tongue 04/01/2019  . GERD 01/19/2010  . CONSTIPATION, CHRONIC 01/19/2010  . ABDOMINAL PAIN 01/19/2010   Ihor Austin, LPTA/CLT; CBIS [phone]  Aldona Lento 06/01/2020, 5:06 PM  [facility] [place], [place], [address] Phone: [phone]   Fax:  [phone]  Name: Elizabeth Cohen MRN: [id number] Date of Birth: 04-30-1960

## 2020-06-03 ENCOUNTER — Encounter: Payer: Self-pay | Admitting: Physical Therapy

## 2020-06-03 ENCOUNTER — Ambulatory Visit: Payer: BC Managed Care – PPO | Admitting: Physical Therapy

## 2020-06-03 ENCOUNTER — Other Ambulatory Visit: Payer: Self-pay

## 2020-06-03 DIAGNOSIS — M25562 Pain in left knee: Secondary | ICD-10-CM | POA: Diagnosis not present

## 2020-06-03 DIAGNOSIS — G8929 Other chronic pain: Secondary | ICD-10-CM

## 2020-06-03 DIAGNOSIS — M25561 Pain in right knee: Secondary | ICD-10-CM

## 2020-06-03 DIAGNOSIS — M6281 Muscle weakness (generalized): Secondary | ICD-10-CM

## 2020-06-03 NOTE — Therapy (Signed)
[facility] [place], [place], [address] Phone: [phone]   Fax:  [phone]  Physical Therapy Treatment  Patient Details  Name: Elizabeth Cohen MRN: [id number] Date of Birth: Nov 24, 1960 Referring Provider (PT): Netta Cedars, MD   Encounter Date: 06/03/2020   PT End of Session - 06/03/20 0902    Visit Number 7    Number of Visits 8    Date for PT Re-Evaluation 06/10/20    Authorization Type BCBS; Progress note every 10th visit    PT Start Time 0900    PT Stop Time 0947    PT Time Calculation (min) 47 min    Activity Tolerance Patient tolerated treatment well    Behavior During Therapy [facility] for tasks assessed/performed           Past Medical History:  Diagnosis Date  . Acid reflux   . Anxiety    OCD  . Bell's palsy 03/2019  . Cancer ([place])    Skin right arm and left ear, left breast  . Complication of anesthesia    slow to wake up during wisdom teeth ext, no other problems with the other surgery  . Depression   . 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 Hypertension   . Migraine   . Positive ANA (antinuclear antibody)    no problems, no meds    Past Surgical History:  Procedure Laterality Date  . ABDOMINAL HYSTERECTOMY    . APPENDECTOMY  2002  . BACK SURGERY  07/25/2019   Laminectomy and poraminotomy lumbar @ [facility] [place]  . CARPAL TUNNEL RELEASE  2015  . CHOLECYSTECTOMY  2004  . COLONOSCOPY  10/04/2010   Procedure: COLONOSCOPY;  Surgeon: Daneil Dolin, MD;  [facility];  Service: Endoscopy;  Laterality: N/A;  8:15AM  . COLONOSCOPY WITH PROPOFOL N/A 12/26/2019   Procedure: COLONOSCOPY WITH PROPOFOL;  Surgeon: Daneil Dolin, MD;  [facility];  Service: Endoscopy;  Laterality: N/A;  12:30pm  . ESOPHAGOGASTRODUODENOSCOPY (EGD) WITH PROPOFOL N/A 12/26/2019   Procedure: ESOPHAGOGASTRODUODENOSCOPY (EGD) WITH  PROPOFOL;  Surgeon: Daneil Dolin, MD;  [facility];  Service: Endoscopy;  Laterality: N/A;  . EXTERNAL EAR SURGERY Left 01/2018   Skin cancer  . FOOT SURGERY Bilateral 2018  . POLYPECTOMY  12/26/2019   Procedure: POLYPECTOMY;  Surgeon: Daneil Dolin, MD;  [facility];  Service: Endoscopy;;  . SKIN CANCER EXCISION Right 09/20/2018   Right arm  . TENDON REPAIR    . ULNAR NERVE REPAIR    . UPPER GI ENDOSCOPY    . WISDOM TOOTH EXTRACTION      There were no vitals filed for this visit.   Subjective Assessment - 06/03/20 0902    Subjective COVID-19 screening performed upon arrival. Left knee feels swollen on the inside, pain scale 7/10 bil.    Patient Stated Goals decrease pain    Currently in Pain? Yes    Pain Score 7     Pain Location Knee    Pain Orientation Right;Left    Pain Descriptors / Indicators Aching;Sharp    Pain Type Chronic pain                             [facility] Adult PT Treatment/Exercise - 06/03/20 0001      Knee/Hip  Exercises: Aerobic   Nustep Level 4  x 22mins      Knee/Hip Exercises: Standing   Heel Raises 2 sets;10 reps    Heel Raises Limitations 5" holds    Rocker Board 3 minutes    Rocker Board Limitations lateral    SLS 2" max BLE    SLS with Vectors 3x15 intermittent HHA right, HHA left    Other Standing Knee Exercises tandem stance multiple reps bil      Knee/Hip Exercises: Sidelying   Hip ABduction 10 reps;Both    Hip ABduction Limitations 5 sec hold    Clams green x 15 bil    Other Sidelying Knee/Hip Exercises clam with IR x 5 and green band      Manual Therapy   Manual Therapy Soft tissue mobilization    Soft tissue mobilization IASTL bil quads                       PT Long Term Goals - 05/27/20 7035      PT LONG TERM GOAL #1   Title Patient will be independent with HEP and its progression.    Status On-going      PT LONG TERM GOAL #2   Title Patient will demonstrate 4/5  bilateral LE MMT in all planes to improve stability during functional tasks.    Status Partially Met      PT LONG TERM GOAL #3   Title Patient will decrease risk of falls and improve functional LE strength as noted by the ability to perform 5x sit to stand test with UE support in 11.4 seconds or less.    Baseline met initial goal of less than 25 sec with 18.77 sec    Status Revised                 Plan - 06/03/20 0947    Clinical Impression Statement Patient reports she has made definite gains in strength, but no change in pain. Her left knee gave out this morning but she did not fall. She did well with balance and strengthening today with no increase in pain. Good response to IASTM in bil quads. She sees MD 06/10/20.    PT Frequency 2x / week    PT Duration 4 weeks    PT Treatment/Interventions ADLs/Self Care Home Management;Cryotherapy;Iontophoresis 4mg /ml Dexamethasone;Moist Heat;Ultrasound;Gait training;Stair training;Functional mobility training;Therapeutic activities;Therapeutic exercise;Balance training;Neuromuscular re-education;Manual techniques;Passive range of motion;Patient/family education;Vasopneumatic Device    PT Next Visit Plan MD note for 06/10/20 apt. Continue with glueus med strength/balance activities.    Consulted and Agree with Plan of Care Patient           Patient will benefit from skilled therapeutic intervention in order to improve the following deficits and impairments:  Abnormal gait,Difficulty walking,Decreased range of motion,Decreased activity tolerance,Decreased balance,Decreased strength,Increased edema,Pain  Visit Diagnosis: Chronic pain of left knee  Chronic pain of right knee  Muscle weakness (generalized)     Problem List Patient Active Problem List   Diagnosis Date Noted  . Lumbar stenosis with neurogenic claudication 08/14/2019  . S/P lumbar fusion 08/14/2019  . Left-sided Bell's palsy 04/01/2019  . Numbness of tongue 04/01/2019  .  GERD 01/19/2010  . CONSTIPATION, CHRONIC 01/19/2010  . ABDOMINAL PAIN 01/19/2010    Madelyn Flavors PT 06/03/2020, 3:08 PM  [facility] [address] [place], [place], [address] Phone: [phone]   Fax:  [phone]  Name: KHALIAH BARNICK MRN: [id number] Date of Birth:  09/12/1960   

## 2020-06-08 ENCOUNTER — Ambulatory Visit: Payer: BC Managed Care – PPO | Admitting: Physical Therapy

## 2020-06-08 ENCOUNTER — Other Ambulatory Visit: Payer: Self-pay

## 2020-06-08 DIAGNOSIS — G8929 Other chronic pain: Secondary | ICD-10-CM

## 2020-06-08 DIAGNOSIS — M25562 Pain in left knee: Secondary | ICD-10-CM

## 2020-06-08 DIAGNOSIS — M6281 Muscle weakness (generalized): Secondary | ICD-10-CM

## 2020-06-08 DIAGNOSIS — M25561 Pain in right knee: Secondary | ICD-10-CM

## 2020-06-08 NOTE — Therapy (Signed)
[facility] [place], [place], [address] Phone: [phone]   Fax:  [phone]  Physical Therapy Treatment  Patient Details  Name: Elizabeth Cohen MRN: [id number] Date of Birth: 1960/04/27 Referring Provider (PT): Netta Cedars, MD   Encounter Date: 06/08/2020   PT End of Session - 06/08/20 1119    Visit Number 8    Number of Visits 8    Date for PT Re-Evaluation 06/10/20    Authorization Type BCBS; Progress note every 10th visit    PT Start Time 1116    PT Stop Time 1158    PT Time Calculation (min) 42 min    Activity Tolerance Patient tolerated treatment well    Behavior During Therapy [facility] for tasks assessed/performed           Past Medical History:  Diagnosis Date  . Acid reflux   . Anxiety    OCD  . Bell's palsy 03/2019  . Cancer ([place])    Skin right arm and left ear, left breast  . Complication of anesthesia    slow to wake up during wisdom teeth ext, no other problems with the other surgery  . Depression   . 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 Hypertension   . Migraine   . Positive ANA (antinuclear antibody)    no problems, no meds    Past Surgical History:  Procedure Laterality Date  . ABDOMINAL HYSTERECTOMY    . APPENDECTOMY  2002  . BACK SURGERY  07/25/2019   Laminectomy and poraminotomy lumbar @ [facility]  . CARPAL TUNNEL RELEASE  2015  . CHOLECYSTECTOMY  2004  . COLONOSCOPY  10/04/2010   Procedure: COLONOSCOPY;  Surgeon: Daneil Dolin, MD;  [facility];  Service: Endoscopy;  Laterality: N/A;  8:15AM  . COLONOSCOPY WITH PROPOFOL N/A 12/26/2019   Procedure: COLONOSCOPY WITH PROPOFOL;  Surgeon: Daneil Dolin, MD;  [facility];  Service: Endoscopy;  Laterality: N/A;  12:30pm  . ESOPHAGOGASTRODUODENOSCOPY (EGD) WITH PROPOFOL N/A 12/26/2019   Procedure: ESOPHAGOGASTRODUODENOSCOPY (EGD) WITH  PROPOFOL;  Surgeon: Daneil Dolin, MD;  [facility];  Service: Endoscopy;  Laterality: N/A;  . EXTERNAL EAR SURGERY Left 01/2018   Skin cancer  . FOOT SURGERY Bilateral 2018  . POLYPECTOMY  12/26/2019   Procedure: POLYPECTOMY;  Surgeon: Daneil Dolin, MD;  [facility];  Service: Endoscopy;;  . SKIN CANCER EXCISION Right 09/20/2018   Right arm  . TENDON REPAIR    . ULNAR NERVE REPAIR    . UPPER GI ENDOSCOPY    . WISDOM TOOTH EXTRACTION      There were no vitals filed for this visit.   Subjective Assessment - 06/08/20 1117    Subjective COVID-19 screening performed upon arrival. Patient reported ongoing pain in knees with left one "buckled" last night.    Pertinent History latex and tape allergy, anxiety, depression, HTN, migraines, lumbar fusion, history of ulnar nerve repair, history of bilateral foot surgery, history of bells palsy    Limitations Sitting;Standing;Walking;House hold activities    How long can you sit comfortably? "short periods"    How long can you stand comfortably? "<10 mins"    How long can you walk comfortably? "< 10 mins"    Diagnostic tests x-ray: arthritis    Patient Stated Goals decrease pain    Currently  in Pain? Yes    Pain Score 6     Pain Location Knee    Pain Orientation Right;Left    Pain Descriptors / Indicators Discomfort;Aching    Pain Type Chronic pain    Pain Onset More than a month ago    Pain Frequency Constant    Aggravating Factors  prolong standing activity    Pain Relieving Factors rest              [facility] PT Assessment - 06/08/20 0001      Strength   Strength Assessment Site Hip;Knee    Right/Left Hip Left;Right    Right Hip Flexion 5/5    Right Hip ABduction 5/5    Left Hip Flexion 5/5    Left Hip ABduction 4+/5    Right Knee Flexion 5/5    Right Knee Extension 5/5    Left Knee Flexion 5/5    Left Knee Extension 5/5                         [facility] Adult PT Treatment/Exercise -  06/08/20 0001      Knee/Hip Exercises: Aerobic   Nustep Level 4  x 66mns      Knee/Hip Exercises: Standing   Rocker Board 3 minutes      Knee/Hip Exercises: Seated   Sit to Sand 5 reps;without UE support      Knee/Hip Exercises: Supine   Straight Leg Raises Strengthening;Both;2 sets;10 reps      Knee/Hip Exercises: Sidelying   Clams green x 15-20 bil      Manual Therapy   Manual Therapy Soft tissue mobilization    Soft tissue mobilization IASTL bil quads to reduce pain and tone                       PT Long Term Goals - 06/08/20 1128      PT LONG TERM GOAL #1   Title Patient will be independent with HEP and its progression.    Baseline Met 06/08/20    Time 4    Period Weeks    Status Achieved      PT LONG TERM GOAL #2   Title Patient will demonstrate 4/5 bilateral LE MMT in all planes to improve stability during functional tasks.    Baseline Met 06/08/20    Time 4    Period Weeks    Status Achieved      PT LONG TERM GOAL #3   Title Patient will decrease risk of falls and improve functional LE strength as noted by the ability to perform 5x sit to stand test with UE support in 11.4 seconds or less.    Baseline met 06/08/20    Time 4    Period Weeks    Status Achieved      PT LONG TERM GOAL #4   Title Patient will report ability to perform ADLs with bilateral knee pain less than or equal to 4/10.    Baseline 5-8/10 pain today 06/08/20    Period Weeks    Status Not Met      PT LONG TERM GOAL #5   Title Patient will report ability to stand and walk for 20 mins or greater to improve ability to perform ADLs and home tasks.    Baseline unable due to pain 06/08/20    Time 4    Period Weeks    Status Not Met  Plan - 06/08/20 1206    Clinical Impression Statement Patient tolerated treatment well today. Patient has improved with strength in bil LE and able to perform most activities yet continues to be limitied by pain. Patient doing  well with HEP and green band issued today. Patient met all goals except limited with ADL's and walking due to pain in bil knees.    Personal Factors and Comorbidities Age;Comorbidity 2;Time since onset of injury/illness/exacerbation    Comorbidities latex and tape allergy, anxiety, depression, HTN, migraines, lumbar fusion, history of ulnar nerve repair, history of bilateral foot surgery    Examination-Activity Limitations Locomotion Level;Transfers;Stairs;Stand;Squat    Examination-Participation Restrictions Cleaning;Meal Prep    Stability/Clinical Decision Making Stable/Uncomplicated    Rehab Potential Good    PT Frequency 2x / week    PT Duration 4 weeks    PT Treatment/Interventions ADLs/Self Care Home Management;Cryotherapy;Iontophoresis 60m/ml Dexamethasone;Moist Heat;Ultrasound;Gait training;Stair training;Functional mobility training;Therapeutic activities;Therapeutic exercise;Balance training;Neuromuscular re-education;Manual techniques;Passive range of motion;Patient/family education;Vasopneumatic Device    PT Next Visit Plan on hold to DC pending MD appt    Consulted and Agree with Plan of Care Patient           Patient will benefit from skilled therapeutic intervention in order to improve the following deficits and impairments:  Abnormal gait,Difficulty walking,Decreased range of motion,Decreased activity tolerance,Decreased balance,Decreased strength,Increased edema,Pain  Visit Diagnosis: Chronic pain of left knee  Chronic pain of right knee  Muscle weakness (generalized)     Problem List Patient Active Problem List   Diagnosis Date Noted  . Lumbar stenosis with neurogenic claudication 08/14/2019  . S/P lumbar fusion 08/14/2019  . Left-sided Bell's palsy 04/01/2019  . Numbness of tongue 04/01/2019  . GERD 01/19/2010  . CONSTIPATION, CHRONIC 01/19/2010  . ABDOMINAL PAIN 01/19/2010    CLadean Raya PTA 06/08/20 12:11 PM   [facility] [address] [address]Phone: [phone]  Fax:  [phone] Name: Elizabeth TUCHMRN: [id number] of Birth: 812/17/1962

## 2020-07-07 ENCOUNTER — Encounter: Payer: Self-pay | Admitting: Internal Medicine

## 2020-07-07 ENCOUNTER — Ambulatory Visit: Payer: BC Managed Care – PPO | Admitting: Internal Medicine

## 2020-07-07 VITALS — BP 118/73 | HR 57 | Temp 96.8°F | Ht 67.5 in | Wt 217.2 lb

## 2020-07-07 DIAGNOSIS — K219 Gastro-esophageal reflux disease without esophagitis: Secondary | ICD-10-CM | POA: Diagnosis not present

## 2020-07-07 NOTE — Patient Instructions (Signed)
Continue pantoprazole 40 mg twice daily before breakfast and lunch  Continue famotidine as needed  By all means, continue reflux gourmet on a as needed basis  Hopefully, cervical spine therapy will allow you more mobility.  Weight loss is very important as discussed.  Continue Benefiber daily.  Office visit with [place] in 6 months

## 2020-07-07 NOTE — Progress Notes (Signed)
Primary Care Physician:  Albert Nation, MD Primary Gastroenterologist:  Dr. Gala Romney  Pre-Procedure History & Physical: HPI:  Elizabeth Cohen is a 60 y.o. female here for GERD/regurgitation.  Did not like Aciphex.  States it caused her abdomen to cramp  -  she felt hungry all the time.  She has gained 9 pounds since last seen.  Stopped taking it.  Went back to pantoprazole 40 mg twice daily.  Also, using famotidine on demand.  Really likes reflux gourmet.  Takes it a couple of times a week.  States it pretty well stops reflux in its tracks.  Taste good.  No dysphagia.  Contemplation of cervical spine surgery sometime later this summer.   Past Medical History:  Diagnosis Date  . Acid reflux   . Anxiety    OCD  . Bell's palsy 03/2019  . Cancer ([place])    Skin right arm and left ear, left breast  . Complication of anesthesia    slow to wake up during wisdom teeth ext, no other problems with the other surgery  . Depression   . 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 Hypertension   . Migraine   . Positive ANA (antinuclear antibody)    no problems, no meds    Past Surgical History:  Procedure Laterality Date  . ABDOMINAL HYSTERECTOMY    . APPENDECTOMY  2002  . BACK SURGERY  07/25/2019   Laminectomy and poraminotomy lumbar @ [facility] [place]  . CARPAL TUNNEL RELEASE  2015  . CHOLECYSTECTOMY  2004  . COLONOSCOPY  10/04/2010   Procedure: COLONOSCOPY;  Surgeon: Daneil Dolin, MD;  Location: [facility];  Service: Endoscopy;  Laterality: N/A;  8:15AM  . COLONOSCOPY WITH PROPOFOL N/A 12/26/2019   Procedure: COLONOSCOPY WITH PROPOFOL;  Surgeon: Daneil Dolin, MD;  Location: [facility];  Service: Endoscopy;  Laterality: N/A;  12:30pm  . ESOPHAGOGASTRODUODENOSCOPY (EGD) WITH PROPOFOL N/A 12/26/2019   Procedure: ESOPHAGOGASTRODUODENOSCOPY (EGD) WITH PROPOFOL;  Surgeon: Daneil Dolin, MD;  [facility];  Service: Endoscopy;  Laterality: N/A;  . EXTERNAL EAR SURGERY Left 01/2018   Skin cancer  . FOOT SURGERY Bilateral 2018  . POLYPECTOMY  12/26/2019   Procedure: POLYPECTOMY;  Surgeon: Daneil Dolin, MD;  Location: [facility];  Service: Endoscopy;;  . SKIN CANCER EXCISION Right 09/20/2018   Right arm  . TENDON REPAIR    . ULNAR NERVE REPAIR    . UPPER GI ENDOSCOPY    . WISDOM TOOTH EXTRACTION      Prior to Admission medications   Medication Sig Start Date End Date Taking? Authorizing Provider  chlorthalidone (HYGROTON) 25 MG tablet Take 25 mg by mouth daily.  03/08/19  Yes [provider]  famotidine (PEPCID) 40 MG tablet Take 1 tablet (40 mg total) by mouth 2 (two) times daily. Patient taking differently: Take 40 mg by mouth 2 (two) times daily as needed. 05/07/20  Yes Lean Jaeger, Cristopher Estimable, MD  fluticasone [facility]) 50 MCG/ACT nasal spray Place 1 spray into both nostrils daily.   Yes [provider]  HYDROcodone-acetaminophen (NORCO/VICODIN) 5-325 MG tablet Take 1 tablet by mouth every 6 (six) hours as needed. for pain 04/09/20  Yes [provider]  ibuprofen (ADVIL) 200 MG tablet Take 800 mg by mouth every 6 (six) hours as needed for headache or moderate pain.  Yes [provider]  metoprolol tartrate (LOPRESSOR) 25 MG tablet Take 25 mg by mouth 2 (two) times daily. 02/10/19  Yes [provider]  pantoprazole (PROTONIX) 40 MG tablet Take 40 mg by mouth 2 (two) times daily. 01/20/20  Yes [provider]  PARoxetine (PAXIL) 40 MG tablet Take 40 mg by mouth daily. 04/11/20  Yes [provider]  Probiotic Product (PROBIOTIC DAILY PO) Take by mouth daily.   Yes [provider]  rizatriptan (MAXALT) 10 MG tablet Take 10 mg by mouth daily as needed for migraine. May repeat in 2 hours if needed   Yes [provider]  Wheat Dextrin (BENEFIBER) POWD Take by mouth daily. 2 teaspoons daily   Yes [provider]    Allergies as of 07/07/2020 - Review Complete 07/07/2020  Allergen Reaction Noted  . Celecoxib Hives   . Codeine Itching and Nausea And Vomiting   . Epinephrine Other (See Comments)   . Gabapentin Other (See Comments) 07/10/2019  . Latex    . Morphine Hives   . Naproxen Other (See Comments)   . Povidone-iodine    . Rabeprazole Nausea Only and Other (See Comments) 07/07/2020  . Tape Other (See Comments)   . Topamax [topiramate]  07/10/2019  . Sulfonamide derivatives Rash     Family History  Problem Relation Age of Onset  . Stroke Mother   . Hypertension Mother   . Heart disease Mother   . Atrial fibrillation Mother   . Hypertension Father     Social History   Socioeconomic History  . Marital status: Married    Spouse name: Not on file  . Number of children: 1  . Years of education: some college  . Highest education level: Not on file  Occupational History  . Not on file  Tobacco Use  . Smoking status: Former Smoker    Packs/day: 1.00    Years: 10.00    Pack years: 10.00    Types: Cigarettes    Quit date: 1998    Years since quitting: 24.4  . Smokeless tobacco: Never Used  Vaping Use  . Vaping Use: Never used  Substance and Sexual Activity  . Alcohol use: Never    Alcohol/week: 0.0 standard drinks  . Drug use: Never  . Sexual activity: Yes    Birth control/protection: Surgical    Comment: Hysterectomy  Other Topics Concern  . Not on file  Social History Narrative   Lives at home with her husband.   Right-handed.   No caffeine use.   Social Determinants of Health   Financial Resource Strain: Not on file  Food Insecurity: Not on file  Transportation Needs: Not on file  Physical Activity: Not on file  Stress: Not on file  Social Connections: Not on file  Intimate Partner Violence: Not on file    Review of Systems: See HPI, otherwise negative ROS  Physical Exam: BP 118/73   Pulse (!) 57   Temp (!) 96.8 F (36 C) (Temporal)   Ht 5' 7.5"  (1.715 m)   Wt 217 lb 3.2 oz (98.5 kg)   LMP  (LMP Unknown)   BMI 33.52 kg/m  General:   Alert,  Well-developed, well-nourished, pleasant and cooperative in NAD Neck:  Supple; no masses or thyromegaly. No significant cervical adenopathy. Lungs:  Clear throughout to auscultation.   No wheezes, crackles, or rhonchi. No acute distress. Heart:  Regular rate and rhythm; no murmurs, clicks, rubs,  or gallops. Abdomen: Non-distended,  normal bowel sounds.  Soft and nontender without appreciable mass or hepatosplenomegaly.  Pulses:  Normal pulses noted. Extremities:  Without clubbing or edema.  Impression/Plan: 59 year old with somewhat recalcitrant GERD in the setting of multiple comorbidities. GERD symptoms currently being kept in check fairly well the above-mentioned regimen.  She seems to be getting quite a bit of mileage out of reflux gourmet which is a simple approach to treating reflux. Still requiring twice daily PPI therapy with H2 blocker supplementation.  She is really on maximal medical regimen.  Decreased mobility predisposing her to weight gain Opioid use also predisposing to reflux.  Recommendations: Continue pantoprazole 40 mg twice daily before breakfast and lunch  Continue famotidine as needed  By all means, continue reflux gourmet on a as needed basis  Hopefully, cervical spine therapy will allow you more mobility.  Weight loss is very important as discussed.  Continue Benefiber daily.  Office visit with [place] in 6 months   Notice: This dictation was prepared with Dragon dictation along with smaller phrase technology. Any transcriptional errors that result from this process are unintentional and may not be corrected upon review.

## 2020-08-04 ENCOUNTER — Ambulatory Visit (INDEPENDENT_AMBULATORY_CARE_PROVIDER_SITE_OTHER): Payer: BC Managed Care – PPO | Admitting: Otolaryngology

## 2020-12-22 ENCOUNTER — Encounter: Payer: Self-pay | Admitting: Internal Medicine

## 2021-01-05 ENCOUNTER — Telehealth: Payer: Self-pay

## 2021-01-05 NOTE — Telephone Encounter (Signed)
Refill request received for Protonix 40 mg tab qty: 60 to be sent to [facility].

## 2021-01-06 ENCOUNTER — Other Ambulatory Visit: Payer: Self-pay | Admitting: Gastroenterology

## 2021-01-06 MED ORDER — PANTOPRAZOLE SODIUM 40 MG PO TBEC: 40.0000 mg | DELAYED_RELEASE_TABLET | Freq: Two times a day (BID) | ORAL | 5 refills | Status: DC

## 2021-01-06 NOTE — Telephone Encounter (Signed)
Rx sent 

## 2021-09-06 ENCOUNTER — Other Ambulatory Visit: Payer: Self-pay | Admitting: Gastroenterology

## 2021-09-24 ENCOUNTER — Other Ambulatory Visit: Payer: Self-pay

## 2021-09-24 ENCOUNTER — Emergency Department (HOSPITAL_COMMUNITY)
Admission: EM | Admit: 2021-09-24 | Discharge: 2021-09-25 | Disposition: A | Discharge status: Home / Self Care | Payer: BC Managed Care – PPO | Attending: Emergency Medicine | Admitting: Emergency Medicine

## 2021-09-24 ENCOUNTER — Encounter (HOSPITAL_COMMUNITY): Payer: Self-pay

## 2021-09-24 ENCOUNTER — Emergency Department (HOSPITAL_COMMUNITY): Payer: BC Managed Care – PPO

## 2021-09-24 DIAGNOSIS — R911 Solitary pulmonary nodule: Secondary | ICD-10-CM

## 2021-09-24 DIAGNOSIS — R079 Chest pain, unspecified: Secondary | ICD-10-CM | POA: Diagnosis present

## 2021-09-24 DIAGNOSIS — R55 Syncope and collapse: Secondary | ICD-10-CM | POA: Insufficient documentation

## 2021-09-24 DIAGNOSIS — R1013 Epigastric pain: Secondary | ICD-10-CM | POA: Insufficient documentation

## 2021-09-24 DIAGNOSIS — R200 Anesthesia of skin: Secondary | ICD-10-CM | POA: Insufficient documentation

## 2021-09-24 DIAGNOSIS — Z91040 Latex allergy status: Secondary | ICD-10-CM | POA: Insufficient documentation

## 2021-09-24 DIAGNOSIS — R001 Bradycardia, unspecified: Secondary | ICD-10-CM | POA: Insufficient documentation

## 2021-09-24 DIAGNOSIS — D72829 Elevated white blood cell count, unspecified: Secondary | ICD-10-CM | POA: Insufficient documentation

## 2021-09-24 DIAGNOSIS — R0602 Shortness of breath: Secondary | ICD-10-CM | POA: Diagnosis not present

## 2021-09-24 LAB — CBC WITH DIFFERENTIAL/PLATELET
Abs Immature Granulocytes: 0.05 10*3/uL (ref 0.00–0.07)
Basophils Absolute: 0.1 10*3/uL (ref 0.0–0.1)
Basophils Relative: 1 %
Eosinophils Absolute: 0.2 10*3/uL (ref 0.0–0.5)
Eosinophils Relative: 2 %
HCT: 38 % (ref 36.0–46.0)
Hemoglobin: 12.6 g/dL (ref 12.0–15.0)
Immature Granulocytes: 0 %
Lymphocytes Relative: 29 %
Lymphs Abs: 3.5 10*3/uL (ref 0.7–4.0)
MCH: 30.1 pg (ref 26.0–34.0)
MCHC: 33.2 g/dL (ref 30.0–36.0)
MCV: 90.9 fL (ref 80.0–100.0)
Monocytes Absolute: 1 10*3/uL (ref 0.1–1.0)
Monocytes Relative: 9 %
Neutro Abs: 7.4 10*3/uL (ref 1.7–7.7)
Neutrophils Relative %: 59 %
Platelets: 276 10*3/uL (ref 150–400)
RBC: 4.18 MIL/uL (ref 3.87–5.11)
RDW: 14.5 % (ref 11.5–15.5)
WBC: 12.2 10*3/uL — ABNORMAL HIGH (ref 4.0–10.5)
nRBC: 0 % (ref 0.0–0.2)

## 2021-09-24 LAB — BASIC METABOLIC PANEL
Anion gap: 8 (ref 5–15)
BUN: 21 mg/dL (ref 8–23)
CO2: 25 mmol/L (ref 22–32)
Calcium: 9.1 mg/dL (ref 8.9–10.3)
Chloride: 105 mmol/L (ref 98–111)
Creatinine, Ser: 1.06 mg/dL — ABNORMAL HIGH (ref 0.44–1.00)
GFR, Estimated: 60 mL/min — ABNORMAL LOW (ref >60)
Glucose, Bld: 100 mg/dL — ABNORMAL HIGH (ref 70–99)
Potassium: 3.3 mmol/L — ABNORMAL LOW (ref 3.5–5.1)
Sodium: 138 mmol/L (ref 135–145)

## 2021-09-24 LAB — URINALYSIS, ROUTINE W REFLEX MICROSCOPIC
Bilirubin Urine: NEGATIVE
Glucose, UA: NEGATIVE mg/dL
Hgb urine dipstick: NEGATIVE
Ketones, ur: NEGATIVE mg/dL
Leukocytes,Ua: NEGATIVE
Nitrite: NEGATIVE
Protein, ur: NEGATIVE mg/dL
Specific Gravity, Urine: 1.003 — ABNORMAL LOW (ref 1.005–1.030)
pH: 6 (ref 5.0–8.0)

## 2021-09-24 LAB — TROPONIN I (HIGH SENSITIVITY): Troponin I (High Sensitivity): 6 ng/L (ref <18)

## 2021-09-24 MED ORDER — IOHEXOL 350 MG/ML SOLN
100.0000 mL | Freq: Once | INTRAVENOUS | Status: AC | PRN
  Administered 2021-09-25: 60 mL via INTRAVENOUS

## 2021-09-24 MED ORDER — POTASSIUM CHLORIDE CRYS ER 20 MEQ PO TBCR
40.0000 meq | EXTENDED_RELEASE_TABLET | Freq: Once | ORAL | Status: AC
  Administered 2021-09-24: 40 meq via ORAL
  Filled 2021-09-24: qty 2

## 2021-09-24 NOTE — ED Provider Notes (Incomplete)
[facility] EMERGENCY DEPARTMENT Provider Note   CSN: [id number] Arrival date & time: 09/24/21  2035     History {Add pertinent medical, surgical, social history, OB history to HPI:1} Chief Complaint  Patient presents with  . Chest Pain    Elizabeth Cohen is a 61 y.o. female.  Patient is a 61 year old female with a past medical history of hypertension, migraines, fibromyalgia presenting to the emergency department with chest pain and syncope.  The patient states that on and off throughout the day today she has had an epigastric pain radiating into her chest.  She states that she was working out side today in the garden but was drinking plenty of water.  She states that around 5:00 she got up and became very lightheaded and passed out.  She states that she fell onto a chair and did not hit her head.  She denied any headache prior to the fall.  She states she does not believe she was having chest pain prior to the event.  She states she had mild shortness of breath.  She states that her arm also felt very numb and heavy but states that by the time she came to her arm felt back to normal and she was able to get up and ambulate.  She states that then after dinner she was walking to the bathroom when she became very lightheaded again and sat down on the bed before she passed out again.  She states that she again had the heavy and numb feeling in her right arm that is now again resolved.  She states that she has had no nausea or vomiting.  She denies any numbness or weakness in her legs.  She states that she was bit by a tick about 2 weeks ago but otherwise denies any recent sick symptoms.   Chest Pain      Home Medications Prior to Admission medications   Medication Sig Start Date End Date Taking? Authorizing Provider  DULoxetine (CYMBALTA) 60 MG capsule Take 60 mg by mouth daily.   Yes [provider]  fluticasone (FLONASE) 50 MCG/ACT nasal spray Place 1 spray into both nostrils  daily.   Yes [provider]  ibuprofen (ADVIL) 200 MG tablet Take 800 mg by mouth every 6 (six) hours as needed for headache or moderate pain.   Yes [provider]  metoprolol tartrate (LOPRESSOR) 25 MG tablet Take 25 mg by mouth 2 (two) times daily. 02/10/19  Yes [provider]  oxyCODONE-acetaminophen (PERCOCET) 10-325 MG tablet Take 1 tablet by mouth 2 (two) times daily.   Yes [provider]  pantoprazole (PROTONIX) 40 MG tablet Take 1 tablet by mouth twice daily 09/06/21  Yes Mahon, Courtney L, NP  chlorthalidone (HYGROTON) 25 MG tablet Take 25 mg by mouth daily.  03/08/19   [provider]  famotidine (PEPCID) 40 MG tablet Take 1 tablet (40 mg total) by mouth 2 (two) times daily. Patient taking differently: Take 40 mg by mouth 2 (two) times daily as needed. 05/07/20   Rourk, Cristopher Estimable, MD  HYDROcodone-acetaminophen (NORCO/VICODIN) 5-325 MG tablet Take 1 tablet by mouth every 6 (six) hours as needed. for pain 04/09/20   [provider]  PARoxetine (PAXIL) 40 MG tablet Take 40 mg by mouth daily. 04/11/20   [provider]  Probiotic Product (PROBIOTIC DAILY PO) Take by mouth daily.    [provider]  rizatriptan (MAXALT) 10 MG tablet Take 10 mg by mouth daily as needed  for migraine. May repeat in 2 hours if needed    [provider]  Wheat Dextrin (BENEFIBER) POWD Take by mouth daily. 2 teaspoons daily    [provider]      Allergies    Celecoxib, Codeine, Epinephrine, Gabapentin, Latex, Morphine, Naproxen, Povidone-iodine, Rabeprazole, Tape, Topamax [topiramate], and Sulfonamide derivatives    Review of Systems   Review of Systems  Cardiovascular:  Positive for chest pain.    Physical Exam Updated Vital Signs BP 107/72   Pulse (!) 52   Temp 97.8 F (36.6 C) (Oral)   Resp 16   Ht 5' 7.5" (1.715 m)   Wt 90.7 kg   LMP  (LMP Unknown)   SpO2 98%   BMI 30.86 kg/m  Physical Exam  ED Results /  Procedures / Treatments   Labs (all labs ordered are listed, but only abnormal results are displayed) Labs Reviewed  BASIC METABOLIC PANEL - Abnormal; Notable for the following components:      Result Value   Potassium 3.3 (*)    Glucose, Bld 100 (*)    Creatinine, Ser 1.06 (*)    GFR, Estimated 60 (*)    All other components within normal limits  CBC WITH DIFFERENTIAL/PLATELET - Abnormal; Notable for the following components:   WBC 12.2 (*)    All other components within normal limits  URINALYSIS, ROUTINE W REFLEX MICROSCOPIC - Abnormal; Notable for the following components:   Color, Urine COLORLESS (*)    Specific Gravity, Urine 1.003 (*)    All other components within normal limits  TROPONIN I (HIGH SENSITIVITY)  TROPONIN I (HIGH SENSITIVITY)    EKG None  Radiology DG Chest 2 View  Result Date: 09/24/2021 CLINICAL DATA:  Sternal chest pain. EXAM: CHEST - 2 VIEW COMPARISON:  August 13, 2003 FINDINGS: The heart size and mediastinal contours are within normal limits. Very mild anteromedial right basilar linear atelectasis is seen. There is no evidence of acute infiltrate, pleural effusion or pneumothorax. The visualized skeletal structures are unremarkable. IMPRESSION: No active cardiopulmonary disease. Electronically Signed   By: Virgina Norfolk M.D.   On: 09/24/2021 21:54    Procedures Procedures  {Document cardiac monitor, telemetry assessment procedure when appropriate:1}  Medications Ordered in ED Medications  iohexol (OMNIPAQUE) 350 MG/ML injection 100 mL (has no administration in time range)  potassium chloride SA (KLOR-CON M) CR tablet 40 mEq (40 mEq Oral Given 09/24/21 2245)    ED Course/ Medical Decision Making/ A&P                           Medical Decision Making Amount and/or Complexity of Data Reviewed Labs: ordered. Radiology: ordered.  Risk Prescription drug management.   ***  {Document critical care time when appropriate:1} {Document review of  labs and clinical decision tools ie heart score, Chads2Vasc2 etc:1}  {Document your independent review of radiology images, and any outside records:1} {Document your discussion with family members, caretakers, and with consultants:1} {Document social determinants of health affecting pt's care:1} {Document your decision making why or why not admission, treatments were needed:1} Final Clinical Impression(s) / ED Diagnoses Final diagnoses:  None    Rx / DC Orders ED Discharge Orders     None

## 2021-09-24 NOTE — ED Provider Notes (Signed)
[facility] EMERGENCY DEPARTMENT Provider Note   CSN: [id number] Arrival date & time: 09/24/21  2035     History {Add pertinent medical, surgical, social history, OB history to HPI:1} Chief Complaint  Patient presents with   Chest Pain    Elizabeth Cohen is a 61 y.o. female.  Patient is a 61 year old female with a past medical history of hypertension, migraines, fibromyalgia presenting to the emergency department with chest pain and syncope.  The patient states that on and off throughout the day today she has had an epigastric pain radiating into her chest.  She states that she was working out side today in the garden but was drinking plenty of water.  She states that around 5:00 she got up and became very lightheaded and passed out.  She states that she fell onto a chair and did not hit her head.  She denied any headache prior to the fall.  She states she does not believe she was having chest pain prior to the event.  She states she had mild shortness of breath.  She states that her arm also felt very numb and heavy but states that by the time she came to her arm felt back to normal and she was able to get up and ambulate.  She states that then after dinner she was walking to the bathroom when she became very lightheaded again and sat down on the bed before she passed out again.  She states that she again had the heavy and numb feeling in her right arm that is now again resolved.  She states that she has had no nausea or vomiting.  She denies any numbness or weakness in her legs.  She states that she was bit by a tick about 2 weeks ago but otherwise denies any recent sick symptoms.   Chest Pain      Home Medications Prior to Admission medications   Medication Sig Start Date End Date Taking? Authorizing Provider  DULoxetine (CYMBALTA) 60 MG capsule Take 60 mg by mouth daily.   Yes [provider]  fluticasone (FLONASE) 50 MCG/ACT nasal spray Place 1 spray into both nostrils  daily.   Yes [provider]  ibuprofen (ADVIL) 200 MG tablet Take 800 mg by mouth every 6 (six) hours as needed for headache or moderate pain.   Yes [provider]  metoprolol tartrate (LOPRESSOR) 25 MG tablet Take 25 mg by mouth 2 (two) times daily. 02/10/19  Yes [provider]  oxyCODONE-acetaminophen (PERCOCET) 10-325 MG tablet Take 1 tablet by mouth 2 (two) times daily.   Yes [provider]  pantoprazole (PROTONIX) 40 MG tablet Take 1 tablet by mouth twice daily 09/06/21  Yes Mahon, Courtney L, NP  chlorthalidone (HYGROTON) 25 MG tablet Take 25 mg by mouth daily.  03/08/19   [provider]  famotidine (PEPCID) 40 MG tablet Take 1 tablet (40 mg total) by mouth 2 (two) times daily. Patient taking differently: Take 40 mg by mouth 2 (two) times daily as needed. 05/07/20   Rourk, Cristopher Estimable, MD  HYDROcodone-acetaminophen (NORCO/VICODIN) 5-325 MG tablet Take 1 tablet by mouth every 6 (six) hours as needed. for pain 04/09/20   [provider]  PARoxetine (PAXIL) 40 MG tablet Take 40 mg by mouth daily. 04/11/20   [provider]  Probiotic Product (PROBIOTIC DAILY PO) Take by mouth daily.    [provider]  rizatriptan (MAXALT) 10 MG tablet Take 10 mg by mouth daily as needed  for migraine. May repeat in 2 hours if needed    [provider]  Wheat Dextrin (BENEFIBER) POWD Take by mouth daily. 2 teaspoons daily    [provider]      Allergies    Celecoxib, Codeine, Epinephrine, Gabapentin, Latex, Morphine, Naproxen, Povidone-iodine, Rabeprazole, Tape, Topamax [topiramate], and Sulfonamide derivatives    Review of Systems   Review of Systems  Cardiovascular:  Positive for chest pain.    Physical Exam Updated Vital Signs BP 107/72   Pulse (!) 52   Temp 97.8 F (36.6 C) (Oral)   Resp 16   Ht 5' 7.5" (1.715 m)   Wt 90.7 kg   LMP  (LMP Unknown)   SpO2 98%   BMI 30.86 kg/m  Physical Exam Vitals and  nursing note reviewed.  Constitutional:      General: She is not in acute distress.    Appearance: She is well-developed.  HENT:     Head: Normocephalic and atraumatic.  Eyes:     Extraocular Movements: Extraocular movements intact.     Pupils: Pupils are equal, round, and reactive to light.  Cardiovascular:     Rate and Rhythm: Regular rhythm. Bradycardia present.     Pulses:          Radial pulses are 2+ on the right side and 2+ on the left side.       Dorsalis pedis pulses are 2+ on the right side and 2+ on the left side.  Pulmonary:     Effort: Pulmonary effort is normal.     Breath sounds: Normal breath sounds.  Chest:     Chest wall: No tenderness.  Abdominal:     Palpations: Abdomen is soft.     Tenderness: There is no abdominal tenderness.  Musculoskeletal:        General: Normal range of motion.     Cervical back: Normal range of motion and neck supple.  Skin:    General: Skin is warm and dry.  Neurological:     General: No focal deficit present.     Mental Status: She is alert and oriented to person, place, and time.     Cranial Nerves: No cranial nerve deficit.     Motor: No weakness.     ED Results / Procedures / Treatments   Labs (all labs ordered are listed, but only abnormal results are displayed) Labs Reviewed  BASIC METABOLIC PANEL - Abnormal; Notable for the following components:      Result Value   Potassium 3.3 (*)    Glucose, Bld 100 (*)    Creatinine, Ser 1.06 (*)    GFR, Estimated 60 (*)    All other components within normal limits  CBC WITH DIFFERENTIAL/PLATELET - Abnormal; Notable for the following components:   WBC 12.2 (*)    All other components within normal limits  URINALYSIS, ROUTINE W REFLEX MICROSCOPIC - Abnormal; Notable for the following components:   Color, Urine COLORLESS (*)    Specific Gravity, Urine 1.003 (*)    All other components within normal limits  TROPONIN I (HIGH SENSITIVITY)  TROPONIN I (HIGH SENSITIVITY)     EKG None  Radiology DG Chest 2 View  Result Date: 09/24/2021 CLINICAL DATA:  Sternal chest pain. EXAM: CHEST - 2 VIEW COMPARISON:  August 13, 2003 FINDINGS: The heart size and mediastinal contours are within normal limits. Very mild anteromedial right basilar linear atelectasis is seen. There is no evidence of acute infiltrate, pleural effusion or pneumothorax.  The visualized skeletal structures are unremarkable. IMPRESSION: No active cardiopulmonary disease. Electronically Signed   By: Virgina Norfolk M.D.   On: 09/24/2021 21:54    Procedures Procedures  {Document cardiac monitor, telemetry assessment procedure when appropriate:1}  Medications Ordered in ED Medications  iohexol (OMNIPAQUE) 350 MG/ML injection 100 mL (has no administration in time range)  potassium chloride SA (KLOR-CON M) CR tablet 40 mEq (40 mEq Oral Given 09/24/21 2245)    ED Course/ Medical Decision Making/ A&P                           Medical Decision Making Patient is a 61 year old female with a past medical history of hypertension, migraines, fibromyalgia presenting to the emergency department with chest pain and syncope.  Patient will have EKG performed to evaluate for ACS or arrhythmia as cause of her chest pain known syncope, labs will be performed to evaluate for dehydration, anemia, electrolyte abnormality.  Due to patient's neurologic deficits in the setting of the syncope, concern for dissection and CT dissection study will be performed.  The patient has no headaches and no current focal neurodeficits, making ICH or mass effect unlikely cause of her syncope.  Amount and/or Complexity of Data Reviewed Labs: ordered. Radiology: ordered.  Risk Prescription drug management.   ***  {Document critical care time when appropriate:1} {Document review of labs and clinical decision tools ie heart score, Chads2Vasc2 etc:1}  {Document your independent review of radiology images, and any outside  records:1} {Document your discussion with family members, caretakers, and with consultants:1} {Document social determinants of health affecting pt's care:1} {Document your decision making why or why not admission, treatments were needed:1} Final Clinical Impression(s) / ED Diagnoses Final diagnoses:  None    Rx / DC Orders ED Discharge Orders     None

## 2021-09-24 NOTE — ED Triage Notes (Signed)
Pt arrived via POV c/o sternal CP radiating to her back. Pt reports recent Hx of multiple falls but denies any injury. Pt reports 2 falls today alone. Pt reports staying hydrating and working in the yard recently.

## 2021-09-25 LAB — MAGNESIUM: Magnesium: 2.1 mg/dL (ref 1.7–2.4)

## 2021-09-25 LAB — TROPONIN I (HIGH SENSITIVITY): Troponin I (High Sensitivity): 6 ng/L (ref <18)

## 2021-09-25 MED ORDER — ACETAMINOPHEN 500 MG PO TABS
1000.0000 mg | ORAL_TABLET | Freq: Once | ORAL | Status: AC
  Administered 2021-09-25: 1000 mg via ORAL
  Filled 2021-09-25: qty 2

## 2021-09-25 MED ORDER — LACTATED RINGERS IV BOLUS
1000.0000 mL | Freq: Once | INTRAVENOUS | Status: AC
  Administered 2021-09-25: 1000 mL via INTRAVENOUS

## 2021-09-25 NOTE — Discharge Instructions (Addendum)
You have a small spot on your lung that is abnormal. It is not conerning at this time but with your history of smoking, it does need followed up with repeat ct or cxr as your doctor deems appropriate.   CT lung findings:  A 3 mm noncalcified right middle lobe lung nodule.

## 2021-09-25 NOTE — ED Notes (Signed)
Ambulated pt. She stated she felt fine while ambulating.

## 2021-09-25 NOTE — ED Provider Notes (Signed)
12:14 AM Assumed care from Dr. Ernesto Rutherford, please see their note for full history, physical and decision making until this point. In brief this is a 61 y.o. year old female who presented to the ED tonight with Chest Pain     CP and abdominal pain associated with syncope and right arm numbness/weakness all of which has resolved but had another episode of syncope as well.   Patietn still orthostatic and symptoms at home sound c/w as well. Imaging reassuring.   Fluids improved symptoms. Will fu w/ pcp for near syncope/orthostasis along with pulm nodule.  Will follow up with neuro for possible TIA. Otherwise stable and comfortable for discharge.   Discharge instructions, including strict return precautions for new or worsening symptoms, given. Patient and/or family verbalized understanding and agreement with the plan as described.   Labs, studies and imaging reviewed by myself and considered in medical decision making if ordered. Imaging interpreted by radiology.  Labs Reviewed  BASIC METABOLIC PANEL - Abnormal; Notable for the following components:      Result Value   Potassium 3.3 (*)    Glucose, Bld 100 (*)    Creatinine, Ser 1.06 (*)    GFR, Estimated 60 (*)    All other components within normal limits  CBC WITH DIFFERENTIAL/PLATELET - Abnormal; Notable for the following components:   WBC 12.2 (*)    All other components within normal limits  URINALYSIS, ROUTINE W REFLEX MICROSCOPIC - Abnormal; Notable for the following components:   Color, Urine COLORLESS (*)    Specific Gravity, Urine 1.003 (*)    All other components within normal limits  TROPONIN I (HIGH SENSITIVITY)  TROPONIN I (HIGH SENSITIVITY)    DG Chest 2 View  Final Result    CT Angio Chest Aorta W and/or Wo Contrast    (Results Pending)  CT Head Wo Contrast    (Results Pending)    No follow-ups on file.    Eulia Hatcher, Corene Cornea, MD 09/25/21 [phone]

## 2021-09-28 ENCOUNTER — Other Ambulatory Visit: Payer: Self-pay | Admitting: Gastroenterology

## 2022-03-07 ENCOUNTER — Other Ambulatory Visit: Payer: Self-pay | Admitting: Internal Medicine

## 2022-04-18 IMAGING — CT CT L SPINE W/ CM
3 series · 13 of 33 positions shown, 16 images · IV contrast (Omni 300)
Comparison: None similar that is available for review

CLINICAL DATA: Low back pain with bilateral leg pain that is worse
with standing

EXAM:
LUMBAR MYELOGRAM
FLUOROSCOPY TIME:  Please reference procedure note
PROCEDURE:
Lumbar puncture and intrathecal contrast administration were
performed by Dr Lupitaa who will separately report for the portion
of the procedure. I personally supervised acquisition of the
myelogram images.
TECHNIQUE: Contiguous axial images were obtained through the Lumbar spine after
the intrathecal infusion of infusion. Coronal and sagittal
reconstructions were obtained of the axial image sets.

[Series 4: l-spine 2.0 cor bone · coronal · 0.37mm/px · 3 of 82 slices shown]
[im 17/82  bone]
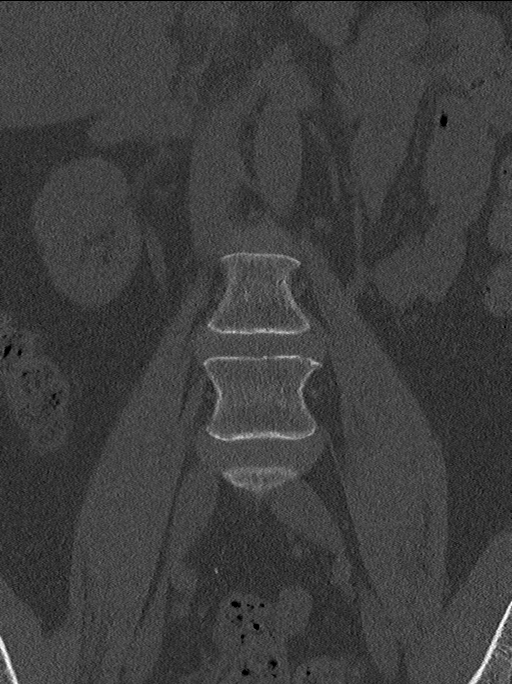
[im 33/82  bone]
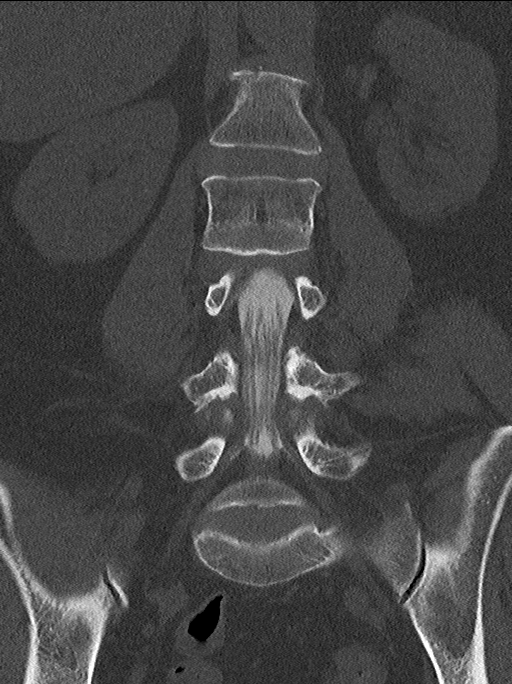
[im 49/82  bone]
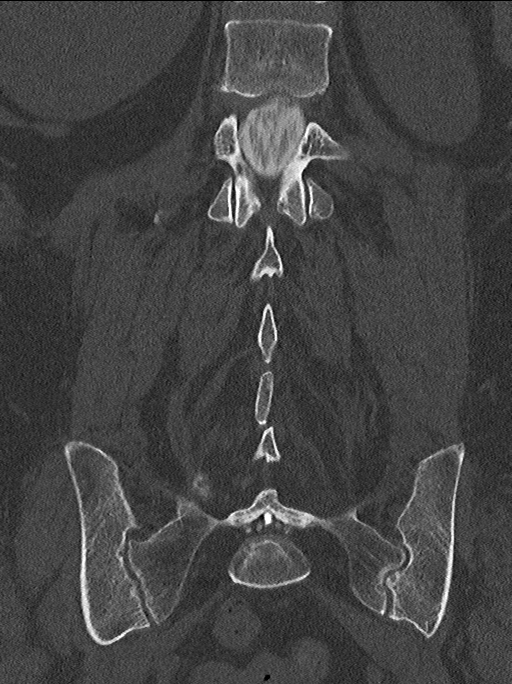

[Series 5: l-spine 2.0 st · axial · 0.36mm/px · z∈[+1084,+1256]mm · 5 of 126 slices shown, 7 images]
[im 20/126  soft-tissue]
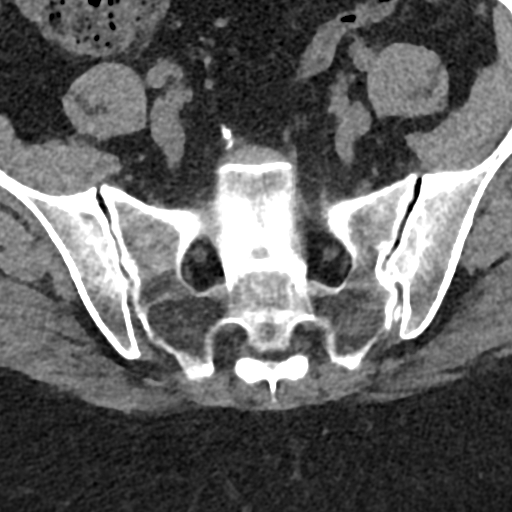
[im 20/126  bone]
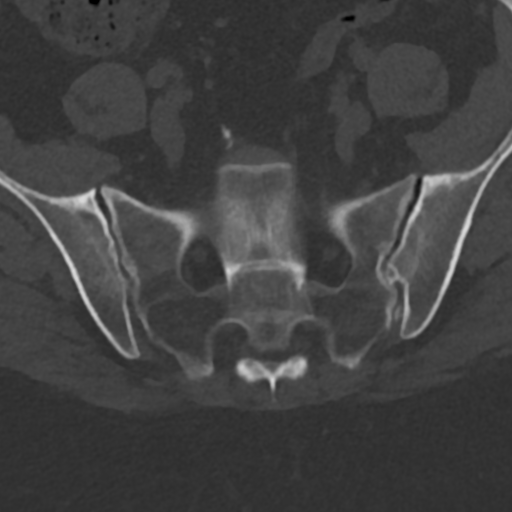
[im 39/126  bone]
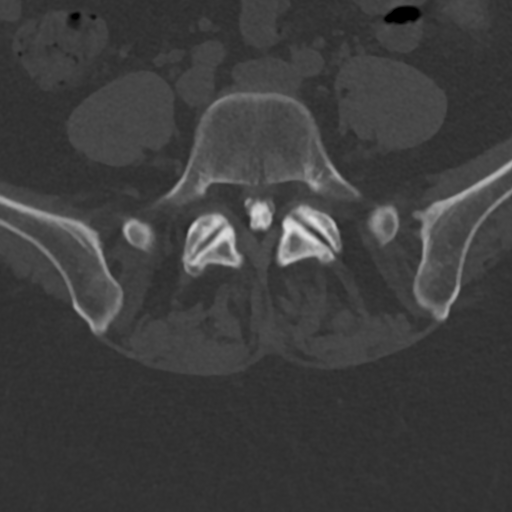
[im 68/126  bone]
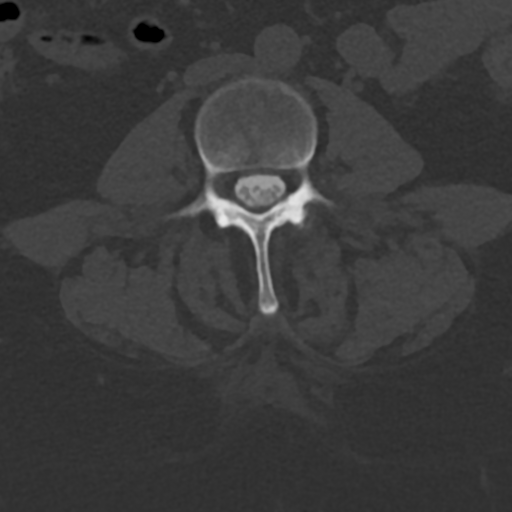
[im 87/126  bone]
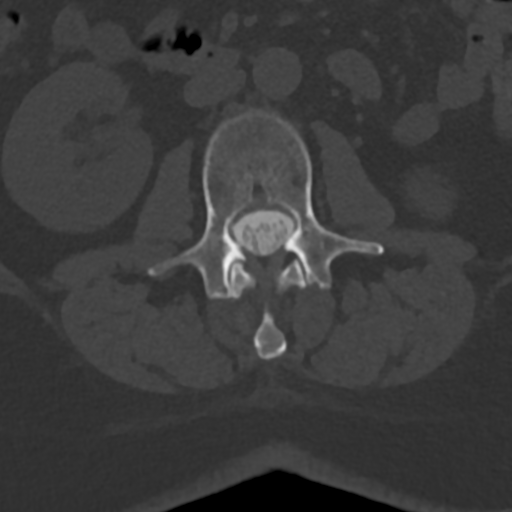
[im 106/126  soft-tissue]
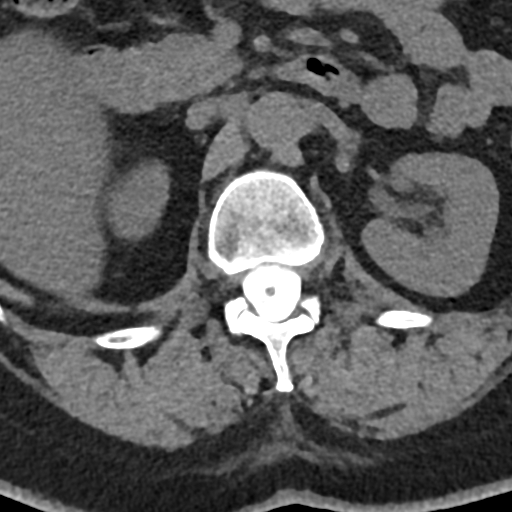
[im 106/126  bone]
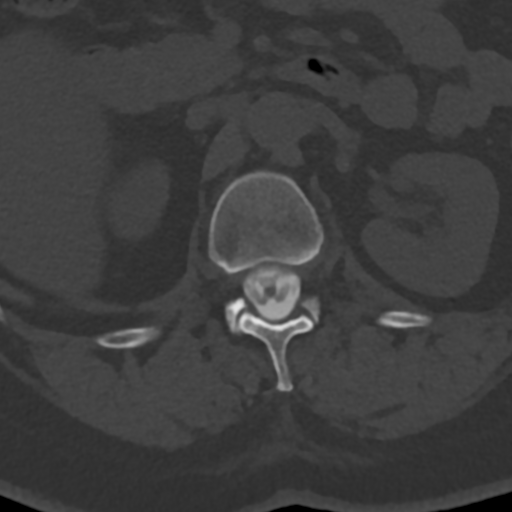

[Series 9: l-spine 2.0 sag bone · sagittal · 0.37mm/px · 5 of 66 slices shown, 6 images]
[im 22/66  bone]
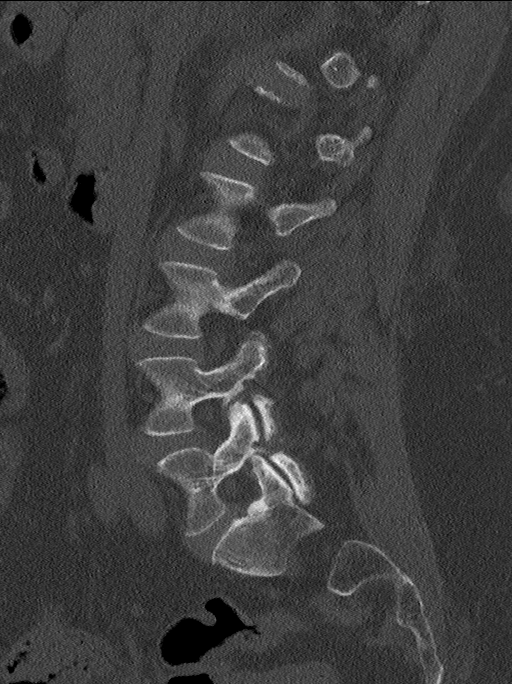
[im 28/66  bone]
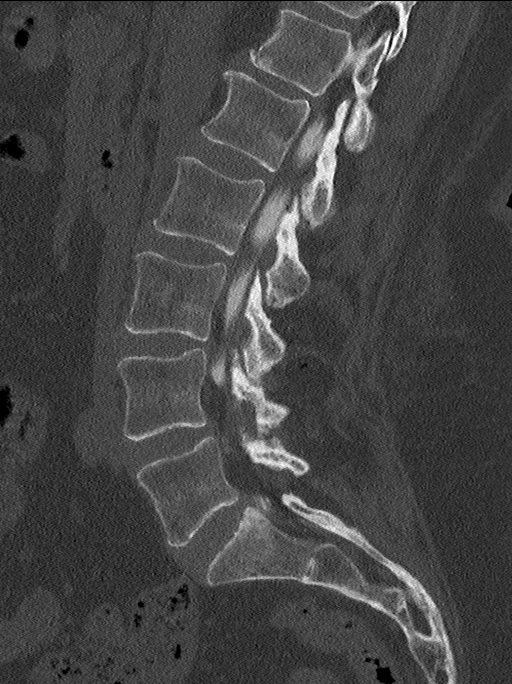
[im 33/66  soft-tissue]
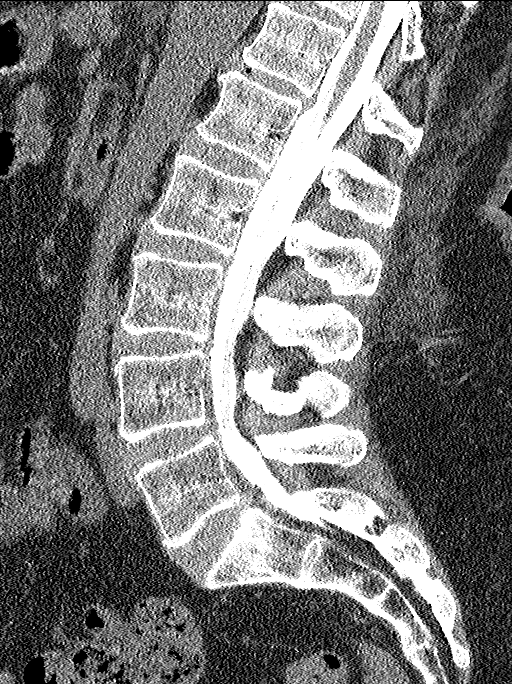
[im 33/66  bone]
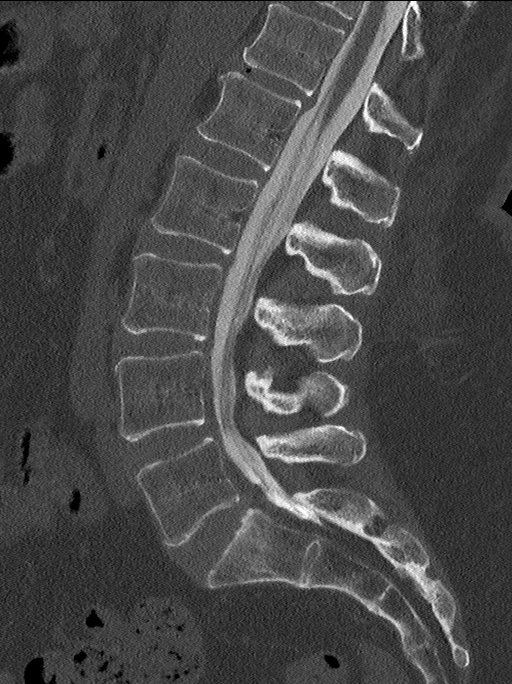
[im 38/66  bone]
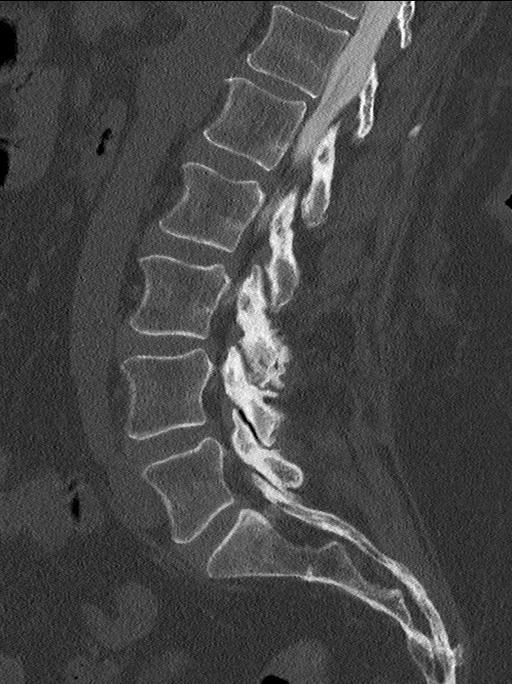
[im 44/66  bone]
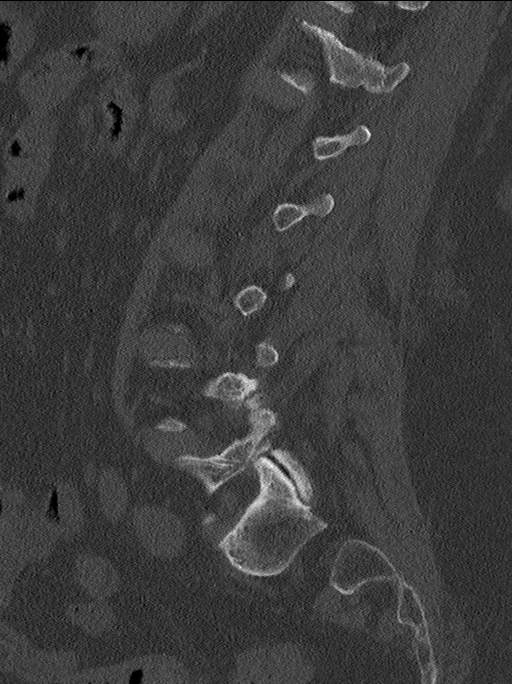

[13 of 33 positions shown; findings below may reference images not displayed]

FINDINGS: LUMBAR MYELOGRAM FINDINGS:

Free flow of intrathecal contrast. There is spinal stenosis at L4-5
which worsens with standing and extension and improves with flexion.
Grade 1 anterolisthesis is present at this level in the setting of
facet arthropathy. Lumbar disc spaces are relatively preserved;
there is moderate T12-L1 disc narrowing.

CT LUMBAR MYELOGRAM FINDINGS:

Segmentation: 5 lumbar type vertebrae based on the lowest ribs.

Alignment: Slight anterolisthesis at L4-5, more pronounced with
standing.

Vertebrae: No evidence of fracture or bone lesion.

Extra-spinal: No evidence of mass or inflammatory process.

Disc levels:

T12- L1: Disc narrowing and mild bulging.  No impingement

L1-L2: Unremarkable.

L2-L3: Unremarkable.

L3-L4: Mild disc narrowing and bulging. Mild facet spurring that is
asymmetric to the left. Mild thecal sac narrowing. Patent foramina

L4-L5: Facet osteoarthritis with spurring and mild anterolisthesis.
There is ligamentum flavum thickening. The disc is bulging and there
is moderate spinal stenosis. The foramina are patent

L5-S1:Mild disc bulging and facet spurring.  No neural impingement
IMPRESSION: Facet osteoarthritis and disc bulging mainly at L3-4 and below.
Dominant findings at L4-5 where there is moderate spinal stenosis
that worsens with standing and extension.

## 2022-05-02 ENCOUNTER — Encounter: Payer: Self-pay | Admitting: Nurse Practitioner

## 2022-05-02 ENCOUNTER — Ambulatory Visit: Payer: BC Managed Care – PPO | Attending: Nurse Practitioner | Admitting: Nurse Practitioner

## 2022-05-02 VITALS — BP 122/88 | HR 72 | Ht 66.5 in | Wt 212.6 lb

## 2022-05-02 DIAGNOSIS — R0609 Other forms of dyspnea: Secondary | ICD-10-CM

## 2022-05-02 DIAGNOSIS — R42 Dizziness and giddiness: Secondary | ICD-10-CM

## 2022-05-02 DIAGNOSIS — R002 Palpitations: Secondary | ICD-10-CM | POA: Diagnosis not present

## 2022-05-02 DIAGNOSIS — R600 Localized edema: Secondary | ICD-10-CM

## 2022-05-02 DIAGNOSIS — I10 Essential (primary) hypertension: Secondary | ICD-10-CM

## 2022-05-02 NOTE — Patient Instructions (Addendum)
Medication Instructions:  Continue all current medications.   Labwork: none  Testing/Procedures: Your physician has requested that you have an echocardiogram. Echocardiography is a painless test that uses sound waves to create images of your heart. It provides your doctor with information about the size and shape of your heart and how well your heart's chambers and valves are working. This procedure takes approximately one hour. There are no restrictions for this procedure. Please do NOT wear cologne, perfume, aftershave, or lotions (deodorant is allowed). Please arrive 15 minutes prior to your appointment time.  Office will contact with results via phone, letter or mychart.     Follow-Up: 4 weeks   Any Other Special Instructions Will Be Listed Below (If Applicable). You have been referred to EP  [facility]  Below knee, low compression stockings - order given   If you need a refill on your cardiac medications before your next appointment, please call your pharmacy.

## 2022-05-02 NOTE — Progress Notes (Signed)
Office Visit    Patient Name: Elizabeth Cohen Date of Encounter: 05/02/2022  PCP:  Caryl Bis, [place]  Cardiologist:  Rozann Lesches, MD  Advanced Practice Provider:  Finis Bud, NP Electrophysiologist:  None [id number]}  Chief Complaint    Elizabeth Cohen is a delightful 62 y.o. female with a hx of fibromyalgia, HTN, bell's palsy, migraines, acid reflux and DOE, who presents today for evaluation of palpitations.   Past Medical History    Past Medical History:  Diagnosis Date   Acid reflux    Anxiety    OCD   Bell's palsy 03/2019   Cancer ([place])    Skin right arm and left ear, left breast   Complication of anesthesia    slow to wake up during wisdom teeth ext, no other problems with the other surgery   Depression    History of skin cancer    Basal cell, squamous cell   right arm and left ear, left breast    Hypertension    Migraine    Positive ANA (antinuclear antibody)    no problems, no meds   Past Surgical History:  Procedure Laterality Date   ABDOMINAL HYSTERECTOMY     APPENDECTOMY  2002   BACK SURGERY  07/25/2019   Laminectomy and poraminotomy lumbar @ [facility]  2015   CHOLECYSTECTOMY  2004   COLONOSCOPY  10/04/2010   Procedure: COLONOSCOPY;  Surgeon: Daneil Dolin, MD;  [facility];  Service: Endoscopy;  Laterality: N/A;  8:15AM   COLONOSCOPY WITH PROPOFOL N/A 12/26/2019   Procedure: COLONOSCOPY WITH PROPOFOL;  Surgeon: Daneil Dolin, MD;  [facility];  Service: Endoscopy;  Laterality: N/A;  12:30pm   ESOPHAGOGASTRODUODENOSCOPY (EGD) WITH PROPOFOL N/A 12/26/2019   Procedure: ESOPHAGOGASTRODUODENOSCOPY (EGD) WITH PROPOFOL;  Surgeon: Daneil Dolin, MD;  [facility];  Service: Endoscopy;  Laterality: N/A;   EXTERNAL EAR SURGERY Left 01/2018   Skin cancer   FOOT SURGERY Bilateral 2018   KNEE ARTHROSCOPY W/ DEBRIDEMENT Left     POLYPECTOMY  12/26/2019   Procedure: POLYPECTOMY;  Surgeon: Daneil Dolin, MD;  [facility];  Service: Endoscopy;;   SKIN CANCER EXCISION Right 09/20/2018   Right arm   TENDON REPAIR     ULNAR NERVE REPAIR     UPPER GI ENDOSCOPY     WISDOM TOOTH EXTRACTION      Allergies  Allergies  Allergen Reactions   Celecoxib Hives   Codeine Itching and Nausea And Vomiting   Epinephrine Other (See Comments)    dizziness   Gabapentin Other (See Comments)    Altered mental state   Latex     Skin blisters   Morphine Hives   Naproxen Other (See Comments)    Gerd   Povidone-Iodine     Betadine cause skin blisters   Rabeprazole Nausea Only and Other (See Comments)    Nausea, abdominal pain   Tape Other (See Comments)    Surgical/ Blisters Bandage adhesive/ Blisters Band-aids/ Blisters   Topamax [Topiramate]     Altered mental state   Sulfonamide Derivatives Rash    History of Present Illness    Elizabeth Cohen is a 62 y.o. female with a PMH as mentioned above.   Previous Zio monitor results are below, reassuring.   Last seen by Dr. Domenic Polite on Jun 25, 2019. She noted shortness of  breath and increased HR while wearing monitor, no CP. Exercise myoview was arranged d/t symptoms was was overall normal, low risk.   Today she presents for evaluation for palpitations. She states she has noticed bilateral leg edema (goes away by morning), racing heartbeat and shortness of breath with walking for the past month. Denies any chest pain. Symptoms resolve when sitting down. Unable to tolerate Zio monitors d/t sweating and latex allergy. Does have hx of fibromyalgia and has hx of profuse sweating for over the last 2 years, feels lightheaded when raising right arm. Denies any chest pain, syncope, dizziness, orthopnea, PND, swelling or significant weight changes, acute bleeding, or claudication.   EKGs/Labs/Other Studies Reviewed:   The following studies were reviewed today:   EKG:   EKG is not ordered today.     Lexiscan on 07/10/2019:  No diagnostic ST segment changes to indicate ischemia. No significant myocardial perfusion defects to indicate scar or ischemia. Breast attenuation artifact is noted. Also radiotracer uptake within the gut near the inferior wall. This is a low risk study. Nuclear stress EF: 63%.  Monitor on 06/25/2019:  Predominant rhythm is sinus with heart rate ranging from 42 bpm up to 141 bpm and average heart rate 69 bpm. Rare PACs and PVCs were noted representing less than 1% of total beats. Very brief burst of SVT lasting 4 beats was noted, no sustained arrhythmias however and no pauses.  Echo on 01/01/2019:   1. Left ventricular ejection fraction, by visual estimation, is 60 to  65%. The left ventricle has normal function. There is no left ventricular  hypertrophy.   2. The left ventricle has no regional wall motion abnormalities.   3. Global right ventricle has normal systolic function.The right  ventricular size is normal. No increase in right ventricular wall  thickness.   4. Left atrial size was severely dilated.   5. Right atrial size was normal.   6. The mitral valve is grossly normal. No evidence of mitral valve  regurgitation.   7. The tricuspid valve is grossly normal. Tricuspid valve regurgitation  is not demonstrated.   8. The aortic valve is tricuspid. Aortic valve regurgitation is not  visualized. No evidence of aortic valve sclerosis or stenosis.   9. The pulmonic valve was grossly normal. Pulmonic valve regurgitation is  not visualized.  10. The inferior vena cava is dilated in size with >50% respiratory  variability, suggesting right atrial pressure of 8 mmHg.  Recent Labs: 09/24/2021: BUN 21; Creatinine, Ser 1.06; Hemoglobin 12.6; Magnesium 2.1; Platelets 276; Potassium 3.3; Sodium 138  Recent Lipid Panel No results found for: "CHOL", "TRIG", "HDL", "CHOLHDL", "VLDL", "LDLCALC", "LDLDIRECT"  Home Medications   Current  Meds  Medication Sig   buPROPion (WELLBUTRIN XL) 150 MG 24 hr tablet Take 150 mg by mouth daily.   chlorthalidone (HYGROTON) 25 MG tablet Take 25 mg by mouth daily.    DULoxetine (CYMBALTA) 60 MG capsule Take 60 mg by mouth daily.   EMGALITY 120 MG/ML SOAJ Inject 1 mL into the skin every 30 (thirty) days.   fluticasone (FLONASE) 50 MCG/ACT nasal spray Place 1 spray into both nostrils daily.   hydrochlorothiazide (HYDRODIURIL) 12.5 MG tablet Take 1 tablet by mouth daily.   metoprolol succinate (TOPROL-XL) 50 MG 24 hr tablet Take 50 mg by mouth daily.   ondansetron (ZOFRAN) 8 MG tablet Take 8 mg by mouth as needed.   oxyCODONE-acetaminophen (PERCOCET) 10-325 MG tablet Take 1 tablet by mouth 2 (two) times daily.  pantoprazole (PROTONIX) 40 MG tablet TAKE 1 TABLET BY MOUTH TWICE DAILY . APPOINTMENT REQUIRED FOR FUTURE REFILLS   rizatriptan (MAXALT) 10 MG tablet Take 10 mg by mouth daily as needed for migraine. May repeat in 2 hours if needed   tiZANidine (ZANAFLEX) 4 MG tablet Take 1 tablet by mouth at bedtime.   valACYclovir (VALTREX) 1000 MG tablet Take 1,000 mg by mouth as needed.     Review of Systems    All other systems reviewed and are otherwise negative except as noted above.  Physical Exam    VS:  BP 122/88   Pulse 72   Ht 5' 6.5" (1.689 m)   Wt 212 lb 9.6 oz (96.4 kg)   LMP  (LMP Unknown)   SpO2 97%   BMI 33.80 kg/m  , BMI Body mass index is 33.8 kg/m.  Wt Readings from Last 3 Encounters:  05/02/22 212 lb 9.6 oz (96.4 kg)  09/24/21 200 lb (90.7 kg)  07/07/20 217 lb 3.2 oz (98.5 kg)     GEN: Obese, 62 y.o. female in no acute distress. HEENT: normal. Neck: Supple, no JVD, carotid bruits, or masses. Cardiac: S1/S2, RRR, no murmurs, rubs, or gallops. No clubbing, cyanosis, edema.  Radials/PT 2+ and equal bilaterally.  Respiratory:  Respirations regular and unlabored, clear to auscultation bilaterally. MS: No deformity or atrophy. Skin: Warm and dry, no rash. Neuro:   Strength and sensation are intact. Psych: Normal affect.  Assessment & Plan    Palpitations Occurs with exertion, ongoing x 1 month. Etiology unknown. Past monitor reassuring. Unable to tolerate Zio monitors or hypoallergenic monitor and discussed ILR with patient. Will refer to EP for consideration of ILR insertion, she is agreeable. Continue Toprol XL, room to titrate Toprol XL if needed based on monitor results. Heart healthy diet and regular cardiovascular exercise encouraged.   DOE Occurs with palpitations. TTE in 2020 showed EF 60-65%, no RWMA. Euvolemic and well compensated on exam. Myoview 06/2019 low risk. Will update TTE at this time.   Lightheadedness Etiology unclear and multifactorial. No carotid bruit noted on exam. Encouraged adequate hydration and continue f/u with PCP, discussed conservative measures. Heart healthy diet and regular cardiovascular exercise encouraged. If no improvement by next OV, consider carotid doppler.   Leg edema Intermittent after standing on feet during day and relieved in morning. Will write Rx for compression stockings. If no improvement by next OV, consider low dose diuretic PRN daily. Heart healthy diet and regular cardiovascular exercise encouraged.   HTN BP stable. Discussed to monitor BP at home at least 2 hours after medications and sitting for 5-10 minutes. No medication changes at this time. Heart healthy diet and regular cardiovascular exercise encouraged.   Disposition: Follow up in 1 month(s) with Rozann Lesches, MD or APP.  Signed, Finis Bud, NP 05/02/2022, 5:03 PM [place]

## 2022-05-24 NOTE — H&P (Signed)
Patient's anticipated LOS is less than 2 midnights, meeting these requirements: - Younger than 43 - Lives within 1 hour of care - Has a competent adult at home to recover with post-op recover - NO history of  - Chronic pain requiring opiods  - Diabetes  - Coronary Artery Disease  - Heart failure  - Heart attack  - Stroke  - DVT/VTE  - Cardiac arrhythmia  - Respiratory Failure/COPD  - Renal failure  - Anemia  - Advanced Liver disease     Elizabeth Cohen is an 62 y.o. female.    Chief Complaint: right knee pain  HPI: Pt is a 62 y.o. female complaining of right knee pain for multiple years. Pain had continually increased since the beginning. X-rays in the clinic show end-stage arthritic changes of the right knee. Pt has tried various conservative treatments which have failed to alleviate their symptoms, including injections and therapy. Various options are discussed with the patient. Risks, benefits and expectations were discussed with the patient. Patient understand the risks, benefits and expectations and wishes to proceed with surgery.   PCP:  Richardean Chimera, MD  D/C Plans: Home  PMH: Past Medical History:  Diagnosis Date   Acid reflux    Anxiety    OCD   Bell's palsy 03/2019   Cancer ([facility])    Skin right arm and left ear, left breast   Complication of anesthesia    slow to wake up during wisdom teeth ext, no other problems with the other surgery   Depression    History of skin cancer    Basal cell, squamous cell   right arm and left ear, left breast    Hypertension    Migraine    Positive ANA (antinuclear antibody)    no problems, no meds    PSH: Past Surgical History:  Procedure Laterality Date   ABDOMINAL HYSTERECTOMY     APPENDECTOMY  2002   BACK SURGERY  07/25/2019   Laminectomy and poraminotomy lumbar @ [facility]   CARPAL TUNNEL RELEASE  2015   CHOLECYSTECTOMY  2004   COLONOSCOPY  10/04/2010   Procedure: COLONOSCOPY;  Surgeon:  Corbin Ade, MD;  [facility];  Service: Endoscopy;  Laterality: N/A;  8:15AM   COLONOSCOPY WITH PROPOFOL N/A 12/26/2019   Procedure: COLONOSCOPY WITH PROPOFOL;  Surgeon: Corbin Ade, MD;  [facility];  Service: Endoscopy;  Laterality: N/A;  12:30pm   ESOPHAGOGASTRODUODENOSCOPY (EGD) WITH PROPOFOL N/A 12/26/2019   Procedure: ESOPHAGOGASTRODUODENOSCOPY (EGD) WITH PROPOFOL;  Surgeon: Corbin Ade, MD;  [facility];  Service: Endoscopy;  Laterality: N/A;   EXTERNAL EAR SURGERY Left 01/2018   Skin cancer   FOOT SURGERY Bilateral 2018   KNEE ARTHROSCOPY W/ DEBRIDEMENT Left    POLYPECTOMY  12/26/2019   Procedure: POLYPECTOMY;  Surgeon: Corbin Ade, MD;  [facility];  Service: Endoscopy;;   SKIN CANCER EXCISION Right 09/20/2018   Right arm   TENDON REPAIR     ULNAR NERVE REPAIR     UPPER GI ENDOSCOPY     WISDOM TOOTH EXTRACTION      Social History:  reports that she quit smoking about 26 years ago. Her smoking use included cigarettes. She has a 10.00 pack-year smoking history. She has never used smokeless tobacco. She reports that she does not drink alcohol and does not use drugs. BMI: Estimated body mass index is 33.8 kg/m as calculated from the following:  Height as of 05/02/22: 5' 6.5" (1.689 m).   Weight as of 05/02/22: 96.4 kg.  No results found for: "ALBUMIN" Diabetes: Patient does not have a diagnosis of diabetes.     Smoking Status:      Allergies:  Allergies  Allergen Reactions   Celecoxib Hives   Codeine Itching and Nausea And Vomiting   Epinephrine Other (See Comments)    dizziness   Gabapentin Other (See Comments)    Altered mental state   Latex     Skin blisters   Morphine Hives   Naproxen Other (See Comments)    Gerd   Povidone-Iodine     Betadine cause skin blisters   Rabeprazole Nausea Only and Other (See Comments)    Nausea, abdominal pain   Tape Other (See Comments)    Surgical/  Blisters Bandage adhesive/ Blisters Band-aids/ Blisters   Topamax [Topiramate]     Altered mental state   Sulfonamide Derivatives Rash    Medications: No current facility-administered medications for this encounter.   Current Outpatient Medications  Medication Sig Dispense Refill   buPROPion (WELLBUTRIN XL) 150 MG 24 hr tablet Take 150 mg by mouth daily.     chlorthalidone (HYGROTON) 25 MG tablet Take 25 mg by mouth daily.      DULoxetine (CYMBALTA) 60 MG capsule Take 60 mg by mouth daily.     EMGALITY 120 MG/ML SOAJ Inject 1 mL into the skin every 30 (thirty) days.     fluticasone (FLONASE) 50 MCG/ACT nasal spray Place 1 spray into both nostrils daily.     hydrochlorothiazide (HYDRODIURIL) 12.5 MG tablet Take 1 tablet by mouth daily.     metoprolol succinate (TOPROL-XL) 50 MG 24 hr tablet Take 50 mg by mouth daily.     ondansetron (ZOFRAN) 8 MG tablet Take 8 mg by mouth as needed.     oxyCODONE-acetaminophen (PERCOCET) 10-325 MG tablet Take 1 tablet by mouth 2 (two) times daily.     pantoprazole (PROTONIX) 40 MG tablet TAKE 1 TABLET BY MOUTH TWICE DAILY . APPOINTMENT REQUIRED FOR FUTURE REFILLS 120 tablet 1   rizatriptan (MAXALT) 10 MG tablet Take 10 mg by mouth daily as needed for migraine. May repeat in 2 hours if needed     tiZANidine (ZANAFLEX) 4 MG tablet Take 1 tablet by mouth at bedtime.     valACYclovir (VALTREX) 1000 MG tablet Take 1,000 mg by mouth as needed.      No results found for this or any previous visit (from the past 48 hour(s)). No results found.  ROS: Pain with rom of the right lower extremity  Physical Exam: Alert and oriented 62 y.o. female in no acute distress Cranial nerves 2-12 intact Cervical spine: full rom with no tenderness, nv intact distally Chest: active breath sounds bilaterally, no wheeze rhonchi or rales Heart: regular rate and rhythm, no murmur Abd: non tender non distended with active bowel sounds Hip is stable with rom  Right knee  painful rom with crepitus Nv intact distally No rashes or edema distally  Assessment/Plan Assessment: right knee end stage osteoarthritis  Plan:  Patient will undergo a right total knee [facility] Risks benefits and expectations were discussed with the patient. Patient understand risks, benefits and expectations and wishes to proceed. Preoperative templating of the joint replacement has been completed, documented, and submitted to the Operating Room personnel in order to optimize intra-operative equipment management.   Brad Blondina Coderre PA-C, MPAS [facility] now [facility]  [place] [address], [address], [place], [place] [address] Phone: [phone] [email or web address] Facebook  [employer]

## 2022-05-25 ENCOUNTER — Ambulatory Visit: Payer: BC Managed Care – PPO | Attending: Nurse Practitioner

## 2022-05-25 DIAGNOSIS — R0609 Other forms of dyspnea: Secondary | ICD-10-CM | POA: Diagnosis not present

## 2022-05-25 LAB — ECHOCARDIOGRAM COMPLETE
AR max vel: 2.62 cm2
AV Area VTI: 2.77 cm2
AV Area mean vel: 2.64 cm2
AV Mean grad: 5 mmHg
AV Peak grad: 8.8 mmHg
Ao pk vel: 1.48 m/s
Area-P 1/2: 4.06 cm2
Calc EF: 70.4 %
MV M vel: 3.75 m/s
MV Peak grad: 56.3 mmHg
S' Lateral: 2.7 cm
Single Plane A2C EF: 69.8 %
Single Plane A4C EF: 73 %

## 2022-06-02 ENCOUNTER — Ambulatory Visit: Payer: BC Managed Care – PPO | Attending: Nurse Practitioner | Admitting: Nurse Practitioner

## 2022-06-02 ENCOUNTER — Encounter: Payer: Self-pay | Admitting: Nurse Practitioner

## 2022-06-02 VITALS — BP 114/76 | HR 69 | Ht 66.0 in | Wt 214.8 lb

## 2022-06-02 DIAGNOSIS — R0609 Other forms of dyspnea: Secondary | ICD-10-CM | POA: Diagnosis not present

## 2022-06-02 DIAGNOSIS — R600 Localized edema: Secondary | ICD-10-CM

## 2022-06-02 DIAGNOSIS — R42 Dizziness and giddiness: Secondary | ICD-10-CM | POA: Diagnosis not present

## 2022-06-02 DIAGNOSIS — I10 Essential (primary) hypertension: Secondary | ICD-10-CM

## 2022-06-02 DIAGNOSIS — Z01810 Encounter for preprocedural cardiovascular examination: Secondary | ICD-10-CM

## 2022-06-02 DIAGNOSIS — R002 Palpitations: Secondary | ICD-10-CM | POA: Diagnosis not present

## 2022-06-02 MED ORDER — FUROSEMIDE 20 MG PO TABS: 20.0000 mg | ORAL_TABLET | Freq: Every day | ORAL | 1 refills | Status: DC | PRN

## 2022-06-02 NOTE — Patient Instructions (Signed)
DUE TO COVID-19 ONLY TWO VISITORS  (aged 62 and older)  ARE ALLOWED TO COME WITH YOU AND STAY IN THE WAITING ROOM ONLY DURING PRE OP AND PROCEDURE.   **NO VISITORS ARE ALLOWED IN THE SHORT STAY AREA OR RECOVERY ROOM!!**  IF YOU WILL BE ADMITTED INTO THE HOSPITAL YOU ARE ALLOWED ONLY FOUR SUPPORT PEOPLE DURING VISITATION HOURS ONLY (7 AM -8PM)   The support person(s) must pass our screening, gel in and out, and wear a mask at all times, including in the patient's room. Patients must also wear a mask when staff or their support person are in the room. Visitors GUEST BADGE MUST BE WORN VISIBLY  One adult visitor may remain with you overnight and MUST be in the room by 8 P.M.     Your procedure is scheduled on: 06/10/22   Report to [facility] Main Entrance    Report to admitting at : 9:30 AM   Call this number if you have problems the morning of surgery [phone]   Do not eat food :After Midnight.   After Midnight you may have the following liquids until : 9:00 AM DAY OF SURGERY  Water Black Coffee (sugar ok, NO MILK/CREAM OR CREAMERS)  Tea (sugar ok, NO MILK/CREAM OR CREAMERS) regular and decaf                             Plain Jell-O (NO RED)                                           Fruit ices (not with fruit pulp, NO RED)                                     Popsicles (NO RED)                                                                  Juice: apple, WHITE grape, WHITE cranberry Sports drinks like Gatorade (NO RED)   The day of surgery:  Drink ONE (1) Pre-Surgery Clear Ensure at : 9:00 AM the morning of surgery. Drink in one sitting. Do not sip.  This drink was given to you during your hospital  pre-op appointment visit. Nothing else to drink after completing the  Pre-Surgery Clear Ensure or G2.          If you have questions, please contact your surgeon's office.  Oral Hygiene is also important to reduce your risk of infection.                                     Remember - BRUSH YOUR TEETH THE MORNING OF SURGERY WITH YOUR REGULAR TOOTHPASTE  DENTURES WILL BE REMOVED PRIOR TO SURGERY PLEASE DO NOT APPLY "Poly grip" OR ADHESIVES!!!   Do NOT smoke after Midnight   Take these medicines the morning of surgery with A SIP OF WATER: bupropion,duloxetine,metoprolol,amlodipine,pantoprazole.Oxicodone,rizatriptan as needed.  You may not have any metal on your body including hair pins, jewelry, and body piercing             Do not wear make-up, lotions, powders, perfumes/cologne, or deodorant  Do not wear nail polish including gel and S&S, artificial/acrylic nails, or any other type of covering on natural nails including finger and toenails. If you have artificial nails, gel coating, etc. that needs to be removed by a nail salon please have this removed prior to surgery or surgery may need to be canceled/ delayed if the surgeon/ anesthesia feels like they are unable to be safely monitored.   Do not shave  48 hours prior to surgery.    Do not bring valuables to the hospital. [facility] IS NOT             RESPONSIBLE   FOR VALUABLES.   Contacts, glasses, or bridgework may not be worn into surgery.   Bring small overnight bag day of surgery.   DO NOT BRING YOUR HOME MEDICATIONS TO THE HOSPITAL. PHARMACY WILL DISPENSE MEDICATIONS LISTED ON YOUR MEDICATION LIST TO YOU DURING YOUR ADMISSION IN THE HOSPITAL!    Patients discharged on the day of surgery will not be allowed to drive home.  Someone NEEDS to stay with you for the first 24 hours after anesthesia.   Special Instructions: Bring a copy of your healthcare power of attorney and living will documents         the day of surgery if you haven't scanned them before.              Please read over the following fact sheets you were given: IF YOU HAVE QUESTIONS ABOUT YOUR PRE-OP INSTRUCTIONS PLEASE CALL [phone]      Incentive Spirometer  An incentive spirometer is a tool  that can help keep your lungs clear and active. This tool measures how well you are filling your lungs with each breath. Taking long deep breaths may help reverse or decrease the chance of developing breathing (pulmonary) problems (especially infection) following: A long period of time when you are unable to move or be active. BEFORE THE PROCEDURE  If the spirometer includes an indicator to show your best effort, your nurse or respiratory therapist will set it to a desired goal. If possible, sit up straight or lean slightly forward. Try not to slouch. Hold the incentive spirometer in an upright position. INSTRUCTIONS FOR USE  Sit on the edge of your bed if possible, or sit up as far as you can in bed or on a chair. Hold the incentive spirometer in an upright position. Breathe out normally. Place the mouthpiece in your mouth and seal your lips tightly around it. Breathe in slowly and as deeply as possible, raising the piston or the ball toward the top of the column. Hold your breath for 3-5 seconds or for as long as possible. Allow the piston or ball to fall to the bottom of the column. Remove the mouthpiece from your mouth and breathe out normally. Rest for a few seconds and repeat Steps 1 through 7 at least 10 times every 1-2 hours when you are awake. Take your time and take a few normal breaths between deep breaths. The spirometer may include an indicator to show your best effort. Use the indicator as a goal to work toward during each repetition. After each set of 10 deep breaths, practice coughing to be sure your lungs are clear. If you have an incision (  the cut made at the time of surgery), support your incision when coughing by placing a pillow or rolled up towels firmly against it. Once you are able to get out of bed, walk around indoors and cough well. You may stop using the incentive spirometer when instructed by your caregiver.  RISKS AND COMPLICATIONS Take your time so you do not get  dizzy or light-headed. If you are in pain, you may need to take or ask for pain medication before doing incentive spirometry. It is harder to take a deep breath if you are having pain. AFTER USE Rest and breathe slowly and easily. It can be helpful to keep track of a log of your progress. Your caregiver can provide you with a simple table to help with this. If you are using the spirometer at home, follow these instructions: SEEK MEDICAL CARE IF:  You are having difficultly using the spirometer. You have trouble using the spirometer as often as instructed. Your pain medication is not giving enough relief while using the spirometer. You develop fever of 100.5 F (38.1 C) or higher. SEEK IMMEDIATE MEDICAL CARE IF:  You cough up bloody sputum that had not been present before. You develop fever of 102 F (38.9 C) or greater. You develop worsening pain at or near the incision site. MAKE SURE YOU:  Understand these instructions. Will watch your condition. Will get help right away if you are not doing well or get worse. Document Released: 06/13/2006 Document Revised: 04/25/2011 Document Reviewed: 08/14/2006 [facility] Patient Information 2014 [place], [place].   ________________________________________________________________________

## 2022-06-02 NOTE — Patient Instructions (Addendum)
Medication Instructions:  Your physician has recommended you make the following change in your medication:  STOP hydrochlorothiazide START lasix 20 mg once a day as needed for leg swelling Continue all other medications as directed  Labwork: none  Testing/Procedures: none  Follow-Up:  Your physician recommends that you schedule a follow-up appointment in: 3-4 months  Any Other Special Instructions Will Be Listed Below (If Applicable).  If you need a refill on your cardiac medications before your next appointment, please call your pharmacy.

## 2022-06-02 NOTE — Progress Notes (Signed)
Office Visit    Patient Name: Elizabeth Cohen Date of Encounter: 06/02/2022  PCP:  Richardean Chimera, MD   [facility]  Cardiologist:  Nona Dell, MD  Advanced Practice Provider:  Sharlene Dory, NP Electrophysiologist:  None [id number]}  Chief Complaint    Elizabeth Cohen is a delightful 62 y.o. female with a hx of fibromyalgia, HTN, bell's palsy, migraines, acid reflux and DOE, who presents today for palpitations follow-up.   Past Medical History    Past Medical History:  Diagnosis Date   Acid reflux    Anxiety    OCD   Bell's palsy 03/2019   Cancer    Skin right arm and left ear, left breast   Complication of anesthesia    slow to wake up during wisdom teeth ext, no other problems with the other surgery   Depression    History of skin cancer    Basal cell, squamous cell   right arm and left ear, left breast    Hypertension    Migraine    Positive ANA (antinuclear antibody)    no problems, no meds   Past Surgical History:  Procedure Laterality Date   ABDOMINAL HYSTERECTOMY     APPENDECTOMY  2002   BACK SURGERY  07/25/2019   Laminectomy and poraminotomy lumbar @ [facility]   CARPAL TUNNEL RELEASE  2015   CHOLECYSTECTOMY  2004   COLONOSCOPY  10/04/2010   Procedure: COLONOSCOPY;  Surgeon: Corbin Ade, MD;  Location: [facility];  Service: Endoscopy;  Laterality: N/A;  8:15AM   COLONOSCOPY WITH PROPOFOL N/A 12/26/2019   Procedure: COLONOSCOPY WITH PROPOFOL;  Surgeon: Corbin Ade, MD;  Location: [facility];  Service: Endoscopy;  Laterality: N/A;  12:30pm   ESOPHAGOGASTRODUODENOSCOPY (EGD) WITH PROPOFOL N/A 12/26/2019   Procedure: ESOPHAGOGASTRODUODENOSCOPY (EGD) WITH PROPOFOL;  Surgeon: Corbin Ade, MD;  Location: [facility];  Service: Endoscopy;  Laterality: N/A;   EXTERNAL EAR SURGERY Left 01/2018   Skin cancer   FOOT SURGERY Bilateral 2018   KNEE ARTHROSCOPY W/ DEBRIDEMENT Left    POLYPECTOMY   12/26/2019   Procedure: POLYPECTOMY;  Surgeon: Corbin Ade, MD;  Location: [facility];  Service: Endoscopy;;   SKIN CANCER EXCISION Right 09/20/2018   Right arm   TENDON REPAIR     ULNAR NERVE REPAIR     UPPER GI ENDOSCOPY     WISDOM TOOTH EXTRACTION      Allergies  Allergies  Allergen Reactions   Sulfur Hives   Celecoxib Hives   Codeine Itching and Nausea And Vomiting   Epinephrine Other (See Comments)    dizziness   Gabapentin Other (See Comments)    Altered mental state   Latex     Skin blisters   Morphine Hives   Naproxen Other (See Comments)    Gerd   Povidone-Iodine     Betadine cause skin blisters   Rabeprazole Nausea Only and Other (See Comments)    Nausea, abdominal pain   Tape Other (See Comments)    Surgical/ Blisters Bandage adhesive/ Blisters Band-aids/ Blisters   Topamax [Topiramate]     Altered mental state   Sulfonamide Derivatives Rash    History of Present Illness    Elizabeth Cohen is a 62 y.o. female with a PMH as mentioned above.   Previous Zio monitor results are below, reassuring.   Last seen by Dr. Diona Browner on Jun 25, 2019. She noted  shortness of breath and increased HR while wearing monitor, no CP. Exercise myoview was arranged d/t symptoms was was overall normal, low risk.   Last saw this patient 1 month ago. She reported noticing bilateral leg edema (goes away by morning), racing heartbeat and shortness of breath with walking for the past month. Denied any chest pain. Symptoms resolved when sitting down. Reported being unable to tolerate Zio monitors d/t sweating and latex allergy. Noted hx of profuse sweating for over the last 2 years, reported feeling lightheaded when raising right arm. Echo arranged and overall benign.   Today she presents for follow-up. Doing the same since last office visit. Hasn't noticed any improvement in symptoms. Continues to note racing heartbeat and shortness of breath with walking. Has scheduled OV with  Dr. Nelly Laurence next week. Also has pending total right knee arthoplasty with dr. Malon Kindle on June 10, 2022. Denies any chest pain, syncope, presyncope, dizziness, orthopnea, PND, significant weight changes, acute bleeding, or claudication.  EKGs/Labs/Other Studies Reviewed:   The following studies were reviewed today:   EKG:  EKG is not ordered today.  EKG dated 09/24/2021 reviewed and revealed NSR, LVH, with nonspecific ST segment changes, no acute ischemic changes.   Echo 05/2022:  1. Left ventricular ejection fraction, by estimation, is 60 to 65%. The  left ventricle has normal function. The left ventricle has no regional  wall motion abnormalities. Left ventricular diastolic parameters were  normal. The average left ventricular  global longitudinal strain is -27.4 %. The global longitudinal strain is  normal.   2. Right ventricular systolic function is normal. The right ventricular  size is normal. There is normal pulmonary artery systolic pressure. The  estimated right ventricular systolic pressure is 18.1 mmHg.   3. The mitral valve is grossly normal. Mild mitral valve regurgitation.   4. The aortic valve is tricuspid. Aortic valve regurgitation is not  visualized. No aortic stenosis is present. Aortic valve mean gradient  measures 5.0 mmHg.   5. The inferior vena cava is normal in size with greater than 50%  respiratory variability, suggesting right atrial pressure of 3 mmHg.   Comparison(s): Prior images reviewed side by side. LVEF remains normal  range at 60-65%. Mild mitral regurgitation.  Lexiscan on 07/10/2019:  No diagnostic ST segment changes to indicate ischemia. No significant myocardial perfusion defects to indicate scar or ischemia. Breast attenuation artifact is noted. Also radiotracer uptake within the gut near the inferior wall. This is a low risk study. Nuclear stress EF: 63%.  Monitor on 06/25/2019:  Predominant rhythm is sinus with heart rate ranging from 42  bpm up to 141 bpm and average heart rate 69 bpm. Rare PACs and PVCs were noted representing less than 1% of total beats. Very brief burst of SVT lasting 4 beats was noted, no sustained arrhythmias however and no pauses.  Echo on 01/01/2019:   1. Left ventricular ejection fraction, by visual estimation, is 60 to  65%. The left ventricle has normal function. There is no left ventricular  hypertrophy.   2. The left ventricle has no regional wall motion abnormalities.   3. Global right ventricle has normal systolic function.The right  ventricular size is normal. No increase in right ventricular wall  thickness.   4. Left atrial size was severely dilated.   5. Right atrial size was normal.   6. The mitral valve is grossly normal. No evidence of mitral valve  regurgitation.   7. The tricuspid valve is grossly normal. Tricuspid  valve regurgitation  is not demonstrated.   8. The aortic valve is tricuspid. Aortic valve regurgitation is not  visualized. No evidence of aortic valve sclerosis or stenosis.   9. The pulmonic valve was grossly normal. Pulmonic valve regurgitation is  not visualized.  10. The inferior vena cava is dilated in size with >50% respiratory  variability, suggesting right atrial pressure of 8 mmHg.  Recent Labs: 09/24/2021: BUN 21; Creatinine, Ser 1.06; Hemoglobin 12.6; Magnesium 2.1; Platelets 276; Potassium 3.3; Sodium 138  Recent Lipid Panel No results found for: "CHOL", "TRIG", "HDL", "CHOLHDL", "VLDL", "LDLCALC", "LDLDIRECT"  Home Medications   Current Meds  Medication Sig   amLODipine (NORVASC) 5 MG tablet Take 5 mg by mouth in the morning.   buPROPion (WELLBUTRIN XL) 150 MG 24 hr tablet Take 150 mg by mouth in the morning.   DULoxetine (CYMBALTA) 60 MG capsule Take 60 mg by mouth 2 (two) times daily.   EMGALITY 120 MG/ML SOAJ Inject 1 mL into the skin every 30 (thirty) days.   fluticasone (FLONASE) 50 MCG/ACT nasal spray Place 1 spray into both nostrils daily.    levocetirizine (XYZAL) 5 MG tablet Take 2.5 mg by mouth every evening.   metoprolol succinate (TOPROL-XL) 50 MG 24 hr tablet Take 50 mg by mouth in the morning.   Nutritional Supplements (ESTROVEN PO) Take 1 capsule by mouth in the morning.   oxyCODONE-acetaminophen (PERCOCET) 10-325 MG tablet Take 1 tablet by mouth 2 (two) times daily.   pantoprazole (PROTONIX) 40 MG tablet TAKE 1 TABLET BY MOUTH TWICE DAILY . APPOINTMENT REQUIRED FOR FUTURE REFILLS   rizatriptan (MAXALT) 10 MG tablet Take 10 mg by mouth daily as needed for migraine. May repeat in 2 hours if needed   tiZANidine (ZANAFLEX) 4 MG tablet Take 4 mg by mouth at bedtime.   valACYclovir (VALTREX) 1000 MG tablet Take 1,000 mg by mouth 2 (two) times daily as needed (cold sores/fever blisters.).   hydrochlorothiazide (HYDRODIURIL) 12.5 MG tablet Take 12.5 mg by mouth in the morning.     Review of Systems    All other systems reviewed and are otherwise negative except as noted above.  Physical Exam    VS:  BP 114/76 (BP Location: Left Arm, Patient Position: Sitting, Cuff Size: Large)   Pulse 69   Ht 5\' 6"  (1.676 m)   Wt 214 lb 12.8 oz (97.4 kg)   LMP  (LMP Unknown)   SpO2 98%   BMI 34.67 kg/m  , BMI Body mass index is 34.67 kg/m.  Wt Readings from Last 3 Encounters:  06/02/22 214 lb 12.8 oz (97.4 kg)  05/02/22 212 lb 9.6 oz (96.4 kg)  09/24/21 200 lb (90.7 kg)     GEN: Obese, 62 y.o. female in no acute distress. HEENT: normal. Neck: Supple, no JVD, carotid bruits, or masses. Cardiac: S1/S2, RRR, no murmurs, rubs, or gallops. No clubbing, cyanosis, edema.  Radials/PT 2+ and equal bilaterally.  Respiratory:  Respirations regular and unlabored, clear to auscultation bilaterally. MS: No deformity or atrophy. Skin: Warm and dry, no rash. Neuro:  Strength and sensation are intact. Psych: Normal affect.  Assessment & Plan    Palpitations Occurs with exertion, ongoing x 2 months. Etiology unknown. Past monitor reassuring.  Unable to tolerate Zio monitors or hypoallergenic monitor and discussed ILR with patient. Has upcoming visit with Dr. Nelly Laurence. Continue Toprol XL, room to titrate Toprol XL if needed based on monitor results. Heart healthy diet and regular cardiovascular exercise encouraged.  DOE Occurs with palpitations. Recent TTE overall reassuring. Euvolemic and well compensated on exam. Myoview 06/2019 low risk.   Lightheadedness Etiology unclear and multifactorial. No carotid bruit noted on exam. Encouraged adequate hydration and continue f/u with PCP, discussed conservative measures. Heart healthy diet and regular cardiovascular exercise encouraged. Defers carotid doppler at this time.   Leg edema Intermittent after standing on feet during day and relieved in morning. Unable to tolerate compression stockings. Will stop HCTZ and start Lasix 20 mg daily PRN. Heart healthy diet and regular cardiovascular exercise encouraged.   HTN BP stable. Discussed to monitor BP at home at least 2 hours after medications and sitting for 5-10 minutes. No medication changes at this time. Heart healthy diet and regular cardiovascular exercise encouraged.   6. Pre-operative cardiovascular risk assessment Ms. Ertle's perioperative risk of a major cardiac event is 0.4% according to the Revised Cardiac Risk Index (RCRI).  Therefore, she is at low risk for perioperative complications.   Her functional capacity is good at 5.07 METs according to the Duke Activity Status Index (DASI). Recommendations: According to ACC/AHA guidelines, no further cardiovascular testing needed.  The patient may proceed to surgery at acceptable risk.   Antiplatelet and/or Anticoagulation Recommendations: Not on any anticoagulation or antiplatelet medication needing to be held prior to surgery. Would recommend to get final recs from EP. Will route my note to requesting party.    Disposition: Follow up in 3-4 months with Nona Dell, MD or  APP.  Signed, Sharlene Dory, NP 06/02/2022, 2:33 PM [facility]

## 2022-06-03 ENCOUNTER — Encounter (HOSPITAL_COMMUNITY)
Admission: RE | Admit: 2022-06-03 | Discharge: 2022-06-03 | Disposition: A | Discharge status: Home / Self Care | Payer: BC Managed Care – PPO | Source: Ambulatory Visit | Attending: Orthopedic Surgery | Admitting: Orthopedic Surgery

## 2022-06-03 ENCOUNTER — Other Ambulatory Visit: Payer: Self-pay

## 2022-06-03 ENCOUNTER — Encounter (HOSPITAL_COMMUNITY): Payer: Self-pay

## 2022-06-03 VITALS — BP 128/80 | HR 64 | Temp 98.4°F | Ht 66.0 in

## 2022-06-03 DIAGNOSIS — I10 Essential (primary) hypertension: Secondary | ICD-10-CM | POA: Insufficient documentation

## 2022-06-03 DIAGNOSIS — Z01812 Encounter for preprocedural laboratory examination: Secondary | ICD-10-CM | POA: Insufficient documentation

## 2022-06-03 DIAGNOSIS — M1711 Unilateral primary osteoarthritis, right knee: Secondary | ICD-10-CM | POA: Insufficient documentation

## 2022-06-03 DIAGNOSIS — Z87891 Personal history of nicotine dependence: Secondary | ICD-10-CM | POA: Insufficient documentation

## 2022-06-03 DIAGNOSIS — I2510 Atherosclerotic heart disease of native coronary artery without angina pectoris: Secondary | ICD-10-CM | POA: Diagnosis not present

## 2022-06-03 DIAGNOSIS — Z01818 Encounter for other preprocedural examination: Secondary | ICD-10-CM

## 2022-06-03 HISTORY — DX: Fibromyalgia: M79.7

## 2022-06-03 HISTORY — DX: Other specified postprocedural states: R11.2

## 2022-06-03 HISTORY — DX: Other specified postprocedural states: Z98.890

## 2022-06-03 HISTORY — DX: Unspecified osteoarthritis, unspecified site: M19.90

## 2022-06-03 HISTORY — DX: Palpitations: R00.2

## 2022-06-03 LAB — CBC
HCT: 41.2 % (ref 36.0–46.0)
Hemoglobin: 13.1 g/dL (ref 12.0–15.0)
MCH: 29 pg (ref 26.0–34.0)
MCHC: 31.8 g/dL (ref 30.0–36.0)
MCV: 91.2 fL (ref 80.0–100.0)
Platelets: 346 10*3/uL (ref 150–400)
RBC: 4.52 MIL/uL (ref 3.87–5.11)
RDW: 12.9 % (ref 11.5–15.5)
WBC: 10.2 10*3/uL (ref 4.0–10.5)
nRBC: 0 % (ref 0.0–0.2)

## 2022-06-03 LAB — BASIC METABOLIC PANEL
Anion gap: 10 (ref 5–15)
BUN: 14 mg/dL (ref 8–23)
CO2: 23 mmol/L (ref 22–32)
Calcium: 9 mg/dL (ref 8.9–10.3)
Chloride: 103 mmol/L (ref 98–111)
Creatinine, Ser: 1.06 mg/dL — ABNORMAL HIGH (ref 0.44–1.00)
GFR, Estimated: 60 mL/min — ABNORMAL LOW (ref >60)
Glucose, Bld: 98 mg/dL (ref 70–99)
Potassium: 3.7 mmol/L (ref 3.5–5.1)
Sodium: 136 mmol/L (ref 135–145)

## 2022-06-03 LAB — SURGICAL PCR SCREEN
MRSA, PCR: NEGATIVE
Staphylococcus aureus: NEGATIVE

## 2022-06-03 NOTE — Progress Notes (Signed)
For Short Stay: COVID SWAB appointment date:  Bowel Prep reminder:   For Anesthesia: PCP - Dr. Donzetta Sprung. Clearance: 04/13/22 Cardiologist - Dr. Nona Dell. Clearance: Sharlene Dory: PA.: Clearance: 06/02/22 Chest x-ray - 09/27/21 EKG - 09/27/21 Stress Test -  ECHO - 05/25/22 Cardiac Cath -  Pacemaker/ICD device last checked: Pacemaker orders received: Device Rep notified:  Spinal Cord Stimulator: N/A  Sleep Study -  CPAP -   Fasting Blood Sugar - N/A Checks Blood Sugar _____ times a day Date and result of last Hgb A1c-  Last dose of GLP1 agonist- N/A GLP1 instructions:   Last dose of SGLT-2 inhibitors- N/A SGLT-2 instructions:   Blood Thinner Instructions: N/A Aspirin Instructions: Last Dose:  Activity level: Can go up a flight of stairs and activities of daily living without stopping and without chest pain and/or shortness of breath   Able to exercise without chest pain and/or shortness of breath  Anesthesia review: Hx: HTN,Palpitations. Pt. Is schedule for loop recorder insertion on 06/07/22.  Patient denies shortness of breath, fever, cough and chest pain at PAT appointment   Patient verbalized understanding of instructions that were given to them at the PAT appointment. Patient was also instructed that they will need to review over the PAT instructions again at home before surgery.

## 2022-06-06 NOTE — Progress Notes (Signed)
Anesthesia chart reviewed   Case: [id number] Date/Time: 06/10/22 1145   Procedure: TOTAL KNEE ARTHROPLASTY (Right: Knee) - general and spinal   Anesthesia type: General   Pre-op diagnosis: right knee osteoarthritis   Location: [facility] ROOM 07 / [facility]   Surgeons: Beverely Low, MD       DISCUSSION: 62 year old former smoker with history of PONV, HTN, right knee OA scheduled for above procedure 06/10/2022 with Dr. Georgiann Hahn.  Patient seen by cardiology 06/02/2022.  Per office visit note, "Ms. Odden's perioperative risk of a major cardiac event is 0.4% according to the Revised Cardiac Risk Index (RCRI).  Therefore, she is at low risk for perioperative complications.   Her functional capacity is good at 5.07 METs according to the Duke Activity Status Index (DASI). Recommendations: According to ACC/AHA guidelines, no further cardiovascular testing needed.  The patient may proceed to surgery at acceptable risk.   Antiplatelet and/or Anticoagulation Recommendations: Not on any anticoagulation or antiplatelet medication needing to be held prior to surgery. Would recommend to get final recs from EP."  Patient has visit with EP on 06/07/2022 for evaluation of symptomatic palpitations.  Per cardio note past monitor has been reassuring patient unable to tolerate ZIO monitors.  She is on Toprol-XL. VS: BP 128/80   Pulse 64   Temp 36.9 C (Oral)   Ht  (1.676 m)   LMP  (LMP Unknown)   SpO2 97%   BMI 34.67 kg/m   PROVIDERS: Richardean Chimera, MD is PCP  Cardiologist:  Nona Dell, MD  LABS: Labs reviewed: Acceptable for surgery. (all labs ordered are listed, but only abnormal results are displayed)  Labs Reviewed  BASIC METABOLIC PANEL - Abnormal; Notable for the following components:      Result Value   Creatinine, Ser 1.06 (*)    GFR, Estimated 60 (*)    All other components within normal limits  SURGICAL PCR SCREEN  CBC     IMAGES:   EKG:   CV: Echo 05/25/2022 1. Left  ventricular ejection fraction, by estimation, is 60 to 65%. The  left ventricle has normal function. The left ventricle has no regional  wall motion abnormalities. Left ventricular diastolic parameters were  normal. The average left ventricular  global longitudinal strain is -27.4 %. The global longitudinal strain is  normal.   2. Right ventricular systolic function is normal. The right ventricular  size is normal. There is normal pulmonary artery systolic pressure. The  estimated right ventricular systolic pressure is 18.1 mmHg.   3. The mitral valve is grossly normal. Mild mitral valve regurgitation.   4. The aortic valve is tricuspid. Aortic valve regurgitation is not  visualized. No aortic stenosis is present. Aortic valve mean gradient  measures 5.0 mmHg.   5. The inferior vena cava is normal in size with greater than 50%  respiratory variability, suggesting right atrial pressure of 3 mmHg.   Myocardial perfusion 07/10/2019 No diagnostic ST segment changes to indicate ischemia. No significant myocardial perfusion defects to indicate scar or ischemia. Breast attenuation artifact is noted. Also radiotracer uptake within the gut near the inferior wall. This is a low risk study. Nuclear stress EF: 63%. Past Medical History:  Diagnosis Date   Acid reflux    Anxiety    OCD   Arthritis    Bell's palsy 03/2019   Cancer    Skin right arm and left ear, left breast   Complication of anesthesia    slow to wake up during  wisdom teeth ext, no other problems with the other surgery   Depression    Fibromyalgia    History of skin cancer    Basal cell, squamous cell   right arm and left ear, left breast    Hypertension    Migraine    Palpitations    PONV (postoperative nausea and vomiting)    Positive ANA (antinuclear antibody)    no problems, no meds    Past Surgical History:  Procedure Laterality Date   ABDOMINAL HYSTERECTOMY     APPENDECTOMY  2002   BACK SURGERY  07/25/2019    Laminectomy and poraminotomy lumbar @ [facility]   CARPAL TUNNEL RELEASE  2015   CHOLECYSTECTOMY  2004   COLONOSCOPY  10/04/2010   Procedure: COLONOSCOPY;  Surgeon: Corbin Ade, MD;  [facility];  Service: Endoscopy;  Laterality: N/A;  8:15AM   COLONOSCOPY WITH PROPOFOL N/A 12/26/2019   Procedure: COLONOSCOPY WITH PROPOFOL;  Surgeon: Corbin Ade, MD;  [facility];  Service: Endoscopy;  Laterality: N/A;  12:30pm   ESOPHAGOGASTRODUODENOSCOPY (EGD) WITH PROPOFOL N/A 12/26/2019   Procedure: ESOPHAGOGASTRODUODENOSCOPY (EGD) WITH PROPOFOL;  Surgeon: Corbin Ade, MD;  [facility];  Service: Endoscopy;  Laterality: N/A;   EXTERNAL EAR SURGERY Left 01/2018   Skin cancer   FOOT SURGERY Bilateral 2018   KNEE ARTHROSCOPY W/ DEBRIDEMENT Left    POLYPECTOMY  12/26/2019   Procedure: POLYPECTOMY;  Surgeon: Corbin Ade, MD;  [facility];  Service: Endoscopy;;   SKIN CANCER EXCISION Right 09/20/2018   Right arm   TENDON REPAIR     ULNAR NERVE REPAIR     UPPER GI ENDOSCOPY     WISDOM TOOTH EXTRACTION      MEDICATIONS:  amLODipine (NORVASC) 5 MG tablet   buPROPion (WELLBUTRIN XL) 150 MG 24 hr tablet   DULoxetine (CYMBALTA) 60 MG capsule   EMGALITY 120 MG/ML SOAJ   fluticasone (FLONASE) 50 MCG/ACT nasal spray   furosemide (LASIX) 20 MG tablet   levocetirizine (XYZAL) 5 MG tablet   metoprolol succinate (TOPROL-XL) 50 MG 24 hr tablet   Nutritional Supplements (ESTROVEN PO)   oxyCODONE-acetaminophen (PERCOCET) 10-325 MG tablet   pantoprazole (PROTONIX) 40 MG tablet   rizatriptan (MAXALT) 10 MG tablet   tiZANidine (ZANAFLEX) 4 MG tablet   valACYclovir (VALTREX) 1000 MG tablet   No current facility-administered medications for this encounter.    Jodell Cipro Ward, PA-C [facility] [phone]

## 2022-06-07 ENCOUNTER — Ambulatory Visit: Payer: BC Managed Care – PPO | Attending: Cardiovascular Disease | Admitting: Cardiovascular Disease

## 2022-06-07 ENCOUNTER — Encounter: Payer: Self-pay | Admitting: Cardiovascular Disease

## 2022-06-07 VITALS — BP 128/74 | HR 65 | Ht 66.0 in | Wt 215.4 lb

## 2022-06-07 DIAGNOSIS — R002 Palpitations: Secondary | ICD-10-CM | POA: Diagnosis not present

## 2022-06-07 DIAGNOSIS — R000 Tachycardia, unspecified: Secondary | ICD-10-CM | POA: Diagnosis not present

## 2022-06-07 NOTE — Anesthesia Preprocedure Evaluation (Addendum)
Anesthesia Evaluation  Patient identified by MRN, date of birth, ID band Patient awake    Reviewed: Allergy & Precautions, NPO status , Patient's Chart, lab work & pertinent test results, reviewed documented beta blocker date and time   History of Anesthesia Complications (+) PROLONGED EMERGENCE and history of anesthetic complications  Airway Mallampati: II  TM Distance: >3 FB Neck ROM: Full    Dental no notable dental hx. (+) Dental Advisory Given, Caps   Pulmonary former smoker   Pulmonary exam normal breath sounds clear to auscultation       Cardiovascular hypertension, Pt. on medications and Pt. on home beta blockers Normal cardiovascular exam Rhythm:Regular Rate:Normal     Neuro/Psych  Headaches PSYCHIATRIC DISORDERS Anxiety Depression     Neuromuscular disease    GI/Hepatic Neg liver ROS,GERD  Medicated,,  Endo/Other  Hyperlipidemia  Renal/GU Lab Results      Component                Value               Date                      CREATININE               1.06 (H)            06/03/2022                BUN                      14                  06/03/2022                NA                       136                 06/03/2022                K                        3.7                 06/03/2022                CL                       103                 06/03/2022                CO2                      23                  06/03/2022             negative genitourinary   Musculoskeletal  (+) Arthritis , Osteoarthritis,  Fibromyalgia -OA right knee Hx/o Lumbar stenosis S/P Lumbar laminectomy   Abdominal  (+) + obese  Peds  Hematology Lab Results      Component                Value               Date  WBC                      10.2                06/03/2022                HGB                      13.1                06/03/2022                HCT                      41.2                 06/03/2022                MCV                      91.2                06/03/2022                PLT                      346                 06/03/2022             ANA +   Anesthesia Other Findings All: see list  Reproductive/Obstetrics                             Anesthesia Physical Anesthesia Plan  ASA: 3  Anesthesia Plan: General and Spinal   Post-op Pain Management: Regional block*, Minimal or no pain anticipated and Tylenol PO (pre-op)*   Induction:   PONV Risk Score and Plan: 4 or greater and Treatment may vary due to age or medical condition, Ondansetron and Midazolam  Airway Management Planned: Nasal Cannula and Natural Airway  Additional Equipment: None  Intra-op Plan:   Post-operative Plan:   Informed Consent: I have reviewed the patients History and Physical, chart, labs and discussed the procedure including the risks, benefits and alternatives for the proposed anesthesia with the patient or authorized representative who has indicated his/her understanding and acceptance.     Dental advisory given  Plan Discussed with: Anesthesiologist and CRNA  Anesthesia Plan Comments: (See PAT note 06/03/22  spinal w  R ADDUCTOR)       Anesthesia Quick Evaluation

## 2022-06-07 NOTE — Progress Notes (Signed)
Electrophysiology Office Note:    Date:  06/07/2022   ID:  Elizabeth Cohen, DOB 1960/04/15, MRN [id number]  PCP:  Richardean Chimera, MD    [facility] Providers Cardiologist:  Nona Dell, MD Cardiology APP:  Sharlene Dory, NP     Referring MD: Sharlene Dory, NP   History of Present Illness:    Elizabeth Cohen is a 62 y.o. female with a hx listed below, significant for fibromyalgia, HTN, bell's palsy, referred for arrhythmia management.  She has tachypalpitations that occur reliably with exertion.  A ZIO monitor was placed, but she had difficulty wearing it due to sweating.  She wore a monitor in May 2021 to evaluate symptoms of heart racing.  The monitor showed normal heart rate range from 42 to 141 bpm, average 69. No sustained arrhythmia was detected.  She is very minimally active. She has difficulty walking due to knee pain and is scheduled to undergo a knee replacement later this week.  She is able to clean house.  She does push mow her yard has to take frequent breaks due to knee pain and shortness of breath.  Her heart rate increases with any exertion, accompanied by shortness of breath.  She has never noticed rapid rates or irregular rhythms while at rest.  Her heart rate reliably decreases back to normal when she is at rest.  Past Medical History:  Diagnosis Date   Acid reflux    Anxiety    OCD   Arthritis    Bell's palsy 03/2019   Cancer    Skin right arm and left ear, left breast   Complication of anesthesia    slow to wake up during wisdom teeth ext, no other problems with the other surgery   Depression    Fibromyalgia    History of skin cancer    Basal cell, squamous cell   right arm and left ear, left breast    Hypertension    Migraine    Palpitations    PONV (postoperative nausea and vomiting)    Positive ANA (antinuclear antibody)    no problems, no meds    Past Surgical History:  Procedure Laterality Date   ABDOMINAL HYSTERECTOMY      APPENDECTOMY  2002   BACK SURGERY  07/25/2019   Laminectomy and poraminotomy lumbar @ [facility]   CARPAL TUNNEL RELEASE  2015   CHOLECYSTECTOMY  2004   COLONOSCOPY  10/04/2010   Procedure: COLONOSCOPY;  Surgeon: Corbin Ade, MD;  [facility];  Service: Endoscopy;  Laterality: N/A;  8:15AM   COLONOSCOPY WITH PROPOFOL N/A 12/26/2019   Procedure: COLONOSCOPY WITH PROPOFOL;  Surgeon: Corbin Ade, MD;  [facility];  Service: Endoscopy;  Laterality: N/A;  12:30pm   ESOPHAGOGASTRODUODENOSCOPY (EGD) WITH PROPOFOL N/A 12/26/2019   Procedure: ESOPHAGOGASTRODUODENOSCOPY (EGD) WITH PROPOFOL;  Surgeon: Corbin Ade, MD;  [facility];  Service: Endoscopy;  Laterality: N/A;   EXTERNAL EAR SURGERY Left 01/2018   Skin cancer   FOOT SURGERY Bilateral 2018   KNEE ARTHROSCOPY W/ DEBRIDEMENT Left    POLYPECTOMY  12/26/2019   Procedure: POLYPECTOMY;  Surgeon: Corbin Ade, MD;  [facility];  Service: Endoscopy;;   SKIN CANCER EXCISION Right 09/20/2018   Right arm   TENDON REPAIR     ULNAR NERVE REPAIR     UPPER GI ENDOSCOPY     WISDOM TOOTH EXTRACTION      Current Medications: No  outpatient medications have been marked as taking for the 06/07/22 encounter (Office Visit) with Saliha Salts, Roberts Gaudy, MD.     Allergies:   Sulfur, Celecoxib, Codeine, Epinephrine, Gabapentin, Latex, Morphine, Naproxen, Povidone-iodine, Rabeprazole, Tape, Topamax [topiramate], and Sulfonamide derivatives   Social and Family History: Reviewed in Epic  ROS:   Please see the history of present illness.    All other systems reviewed and are negative.  EKGs/Labs/Other Studies Reviewed Today:    Echocardiogram:  12/2018 TTE EF 60-65%, severely dilated LA   Monitors: - I personally interpreted the data  ZioXT 05/2019 Sinus rhythm HR 42-141, avg 69 Rare (<1% ectopy) Multiple patient-triggered events show sinus tachycardia -- slow acceleration  of rate without change in P axis/morphology   Stress testing:  2021 Lexiscan    Advanced imaging:    EKG:  Last EKG results: today Sinus rhythm, HR 65 bpm   Recent Labs: 09/24/2021: Magnesium 2.1 06/03/2022: BUN 14; Creatinine, Ser 1.06; Hemoglobin 13.1; Platelets 346; Potassium 3.7; Sodium 136     Physical Exam:    VS:  BP 128/74   Pulse 65   Ht  (1.676 m)   Wt 215 lb 6.4 oz (97.7 kg)   LMP  (LMP Unknown)   SpO2 97%   BMI 34.77 kg/m     Wt Readings from Last 3 Encounters:  06/07/22 215 lb 6.4 oz (97.7 kg)  06/02/22 214 lb 12.8 oz (97.4 kg)  05/02/22 212 lb 9.6 oz (96.4 kg)     GEN: Well nourished, well developed in no acute distress CARDIAC: RRR, no murmurs, rubs, gallops RESPIRATORY:  Normal work of breathing MUSCULOSKELETAL: no edema    ASSESSMENT & PLAN:    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 We will arrange for her to have a stress test when she has recovered from her knee surgery. She should continue her regularly scheduled medications. We are evaluating heart rate response to exertion, not ischemia. I am looking for abrupt change in rate and P wave axis with exertion indicative of an atrial tachycardia. I do not anticipate she will reach [facility].   deconditioning Pt has severely restricted mobility due to knee pain I encouraged her to exercise and participate fully with PT after knee surgery         Medication Adjustments/Labs and Tests Ordered: Current medicines are reviewed at length with the patient today.  Concerns regarding medicines are outlined above.  Orders Placed This Encounter  Procedures   EXERCISE TOLERANCE TEST (ETT)   EKG 12-Lead   No orders of the defined types were placed in this  encounter.    Signed, Maurice Small, MD  06/07/2022 1:04 PM    [facility]

## 2022-06-07 NOTE — Patient Instructions (Signed)
Medication Instructions:  Your physician recommends that you continue on your current medications as directed. Please refer to the Current Medication list given to you today. *If you need a refill on your cardiac medications before your next appointment, please call your pharmacy*   Testing/Procedures: Exercise Treadmill Test in 2.5 months  Your physician has requested that you have an exercise tolerance test. For further information please visit [email or web address] Please also follow instruction sheet, as given.    Follow-Up: At [facility], you and your health needs are our priority.  As part of our continuing mission to provide you with exceptional heart care, we have created designated Provider Care Teams.  These Care Teams include your primary Cardiologist (physician) and Advanced Practice Providers (APPs -  Physician Assistants and Nurse Practitioners) who all work together to provide you with the care you need, when you need it.  We recommend signing up for the patient portal called "MyChart".  Sign up information is provided on this After Visit Summary.  MyChart is used to connect with patients for Virtual Visits (Telemedicine).  Patients are able to view lab/test results, encounter notes, upcoming appointments, etc.  Non-urgent messages can be sent to your provider as well.   To learn more about what you can do with MyChart, go to [email or web address].    Your next appointment:   3 month(s) (after ETT)  Provider:   York Pellant, MD

## 2022-06-10 ENCOUNTER — Ambulatory Visit (HOSPITAL_COMMUNITY): Payer: BC Managed Care – PPO | Admitting: Physician Assistant

## 2022-06-10 ENCOUNTER — Other Ambulatory Visit: Payer: Self-pay

## 2022-06-10 ENCOUNTER — Encounter (HOSPITAL_COMMUNITY): Payer: Self-pay | Admitting: Orthopedic Surgery

## 2022-06-10 ENCOUNTER — Encounter (HOSPITAL_COMMUNITY)
Admission: RE | Disposition: A | Discharge status: Home / Self Care | Payer: Self-pay | Source: Ambulatory Visit | Attending: Orthopedic Surgery

## 2022-06-10 ENCOUNTER — Ambulatory Visit (HOSPITAL_COMMUNITY): Payer: BC Managed Care – PPO | Admitting: Anesthesiology

## 2022-06-10 ENCOUNTER — Observation Stay (HOSPITAL_COMMUNITY)
Admission: RE | Admit: 2022-06-10 | Discharge: 2022-06-11 | Disposition: A | Discharge status: Home / Self Care | Payer: BC Managed Care – PPO | Source: Ambulatory Visit | Attending: Orthopedic Surgery | Admitting: Orthopedic Surgery

## 2022-06-10 DIAGNOSIS — Z79899 Other long term (current) drug therapy: Secondary | ICD-10-CM | POA: Insufficient documentation

## 2022-06-10 DIAGNOSIS — Z91040 Latex allergy status: Secondary | ICD-10-CM | POA: Diagnosis not present

## 2022-06-10 DIAGNOSIS — Z87891 Personal history of nicotine dependence: Secondary | ICD-10-CM | POA: Insufficient documentation

## 2022-06-10 DIAGNOSIS — I10 Essential (primary) hypertension: Secondary | ICD-10-CM | POA: Insufficient documentation

## 2022-06-10 DIAGNOSIS — M1711 Unilateral primary osteoarthritis, right knee: Principal | ICD-10-CM | POA: Insufficient documentation

## 2022-06-10 DIAGNOSIS — Z85828 Personal history of other malignant neoplasm of skin: Secondary | ICD-10-CM | POA: Diagnosis not present

## 2022-06-10 DIAGNOSIS — Z96651 Presence of right artificial knee joint: Secondary | ICD-10-CM

## 2022-06-10 HISTORY — PX: TOTAL KNEE ARTHROPLASTY: SHX125

## 2022-06-10 SURGERY — ARTHROPLASTY, KNEE, TOTAL
Anesthesia: Regional | Site: Knee | Laterality: Right

## 2022-06-10 MED ORDER — BUPIVACAINE LIPOSOME 1.3 % IJ SUSP
INTRAMUSCULAR | Status: DC | PRN
  Administered 2022-06-10: 20 mL

## 2022-06-10 MED ORDER — DEXMEDETOMIDINE HCL IN NACL 80 MCG/20ML IV SOLN
INTRAVENOUS | Status: DC | PRN
  Administered 2022-06-10 (×3): 4 ug via INTRAVENOUS

## 2022-06-10 MED ORDER — SODIUM CHLORIDE (PF) 0.9 % IJ SOLN
INTRAMUSCULAR | Status: AC
  Filled 2022-06-10: qty 30

## 2022-06-10 MED ORDER — FENTANYL CITRATE PF 50 MCG/ML IJ SOSY: 25.0000 ug | PREFILLED_SYRINGE | INTRAMUSCULAR | Status: DC | PRN

## 2022-06-10 MED ORDER — OXYCODONE HCL 5 MG PO TABS
5.0000 mg | ORAL_TABLET | ORAL | Status: DC | PRN
  Administered 2022-06-10 – 2022-06-11 (×2): 10 mg via ORAL
  Filled 2022-06-10 (×2): qty 2

## 2022-06-10 MED ORDER — PROPOFOL 10 MG/ML IV BOLUS
INTRAVENOUS | Status: DC | PRN
  Administered 2022-06-10: 10 mg via INTRAVENOUS

## 2022-06-10 MED ORDER — AMLODIPINE BESYLATE 5 MG PO TABS
5.0000 mg | ORAL_TABLET | Freq: Every morning | ORAL | Status: DC
  Administered 2022-06-11: 5 mg via ORAL
  Filled 2022-06-10: qty 1

## 2022-06-10 MED ORDER — FLUTICASONE PROPIONATE 50 MCG/ACT NA SUSP
1.0000 | Freq: Every day | NASAL | Status: DC
  Administered 2022-06-11: 1 via NASAL
  Filled 2022-06-10: qty 16

## 2022-06-10 MED ORDER — PHENYLEPHRINE 80 MCG/ML (10ML) SYRINGE FOR IV PUSH (FOR BLOOD PRESSURE SUPPORT)
PREFILLED_SYRINGE | INTRAVENOUS | Status: AC
  Filled 2022-06-10: qty 10

## 2022-06-10 MED ORDER — FENTANYL CITRATE PF 50 MCG/ML IJ SOSY
50.0000 ug | PREFILLED_SYRINGE | INTRAMUSCULAR | Status: DC
  Administered 2022-06-10: 50 ug via INTRAVENOUS
  Filled 2022-06-10: qty 2

## 2022-06-10 MED ORDER — BUPIVACAINE-EPINEPHRINE 0.25% -1:200000 IJ SOLN
INTRAMUSCULAR | Status: DC | PRN
  Administered 2022-06-10: 30 mL

## 2022-06-10 MED ORDER — SODIUM CHLORIDE 0.9 % IR SOLN
Status: DC | PRN
  Administered 2022-06-10: 1000 mL

## 2022-06-10 MED ORDER — WATER FOR IRRIGATION, STERILE IR SOLN
Status: DC | PRN
  Administered 2022-06-10: 2000 mL

## 2022-06-10 MED ORDER — PROPOFOL 1000 MG/100ML IV EMUL
INTRAVENOUS | Status: AC
  Filled 2022-06-10: qty 100

## 2022-06-10 MED ORDER — ONDANSETRON HCL 4 MG/2ML IJ SOLN: 4.0000 mg | Freq: Once | INTRAMUSCULAR | Status: DC | PRN

## 2022-06-10 MED ORDER — FUROSEMIDE 20 MG PO TABS: 20.0000 mg | ORAL_TABLET | Freq: Every day | ORAL | Status: DC | PRN

## 2022-06-10 MED ORDER — ONDANSETRON HCL 4 MG/2ML IJ SOLN
INTRAMUSCULAR | Status: AC
  Filled 2022-06-10: qty 2

## 2022-06-10 MED ORDER — ONDANSETRON HCL 4 MG/2ML IJ SOLN: 4.0000 mg | Freq: Four times a day (QID) | INTRAMUSCULAR | Status: DC | PRN

## 2022-06-10 MED ORDER — OXYCODONE-ACETAMINOPHEN 10-325 MG PO TABS: 1.0000 | ORAL_TABLET | ORAL | 0 refills | Status: AC | PRN

## 2022-06-10 MED ORDER — ONDANSETRON HCL 4 MG PO TABS: 4.0000 mg | ORAL_TABLET | Freq: Four times a day (QID) | ORAL | Status: DC | PRN

## 2022-06-10 MED ORDER — MIDAZOLAM HCL 2 MG/2ML IJ SOLN
INTRAMUSCULAR | Status: AC
  Filled 2022-06-10: qty 2

## 2022-06-10 MED ORDER — SODIUM CHLORIDE 0.9 % IV SOLN: INTRAVENOUS | Status: DC

## 2022-06-10 MED ORDER — LIDOCAINE 2% (20 MG/ML) 5 ML SYRINGE
INTRAMUSCULAR | Status: DC | PRN
  Administered 2022-06-10: 60 mg via INTRAVENOUS

## 2022-06-10 MED ORDER — LACTATED RINGERS IV SOLN: INTRAVENOUS | Status: DC

## 2022-06-10 MED ORDER — PROPOFOL 500 MG/50ML IV EMUL
INTRAVENOUS | Status: DC | PRN
  Administered 2022-06-10: 75 ug/kg/min via INTRAVENOUS

## 2022-06-10 MED ORDER — PANTOPRAZOLE SODIUM 40 MG PO TBEC
40.0000 mg | DELAYED_RELEASE_TABLET | Freq: Two times a day (BID) | ORAL | Status: DC
  Administered 2022-06-10 – 2022-06-11 (×2): 40 mg via ORAL
  Filled 2022-06-10 (×2): qty 1

## 2022-06-10 MED ORDER — ORAL CARE MOUTH RINSE: 15.0000 mL | Freq: Once | OROMUCOSAL | Status: AC

## 2022-06-10 MED ORDER — CEFAZOLIN SODIUM-DEXTROSE 2-4 GM/100ML-% IV SOLN
2.0000 g | Freq: Four times a day (QID) | INTRAVENOUS | Status: AC
  Administered 2022-06-10 – 2022-06-11 (×2): 2 g via INTRAVENOUS
  Filled 2022-06-10 (×2): qty 100

## 2022-06-10 MED ORDER — OXYCODONE HCL 5 MG PO TABS: 5.0000 mg | ORAL_TABLET | Freq: Once | ORAL | Status: DC | PRN

## 2022-06-10 MED ORDER — SENNOSIDES-DOCUSATE SODIUM 8.6-50 MG PO TABS: 1.0000 | ORAL_TABLET | Freq: Every evening | ORAL | Status: DC | PRN

## 2022-06-10 MED ORDER — ONDANSETRON HCL 4 MG PO TABS: 4.0000 mg | ORAL_TABLET | Freq: Three times a day (TID) | ORAL | 1 refills | Status: AC | PRN

## 2022-06-10 MED ORDER — OXYCODONE HCL 5 MG/5ML PO SOLN: 5.0000 mg | Freq: Once | ORAL | Status: DC | PRN

## 2022-06-10 MED ORDER — 0.9 % SODIUM CHLORIDE (POUR BTL) OPTIME
TOPICAL | Status: DC | PRN
  Administered 2022-06-10: 1000 mL

## 2022-06-10 MED ORDER — ACETAMINOPHEN 325 MG PO TABS: 325.0000 mg | ORAL_TABLET | Freq: Four times a day (QID) | ORAL | Status: DC | PRN

## 2022-06-10 MED ORDER — BUPIVACAINE HCL (PF) 0.25 % IJ SOLN
INTRAMUSCULAR | Status: AC
  Filled 2022-06-10: qty 30

## 2022-06-10 MED ORDER — LIDOCAINE HCL (PF) 2 % IJ SOLN
INTRAMUSCULAR | Status: AC
  Filled 2022-06-10: qty 5

## 2022-06-10 MED ORDER — TRANEXAMIC ACID-NACL 1000-0.7 MG/100ML-% IV SOLN
1000.0000 mg | INTRAVENOUS | Status: AC
  Administered 2022-06-10: 1000 mg via INTRAVENOUS
  Filled 2022-06-10: qty 100

## 2022-06-10 MED ORDER — OXYCODONE HCL 5 MG PO TABS
10.0000 mg | ORAL_TABLET | ORAL | Status: DC | PRN
  Administered 2022-06-10 – 2022-06-11 (×3): 15 mg via ORAL
  Filled 2022-06-10 (×3): qty 3

## 2022-06-10 MED ORDER — DEXAMETHASONE SODIUM PHOSPHATE 10 MG/ML IJ SOLN
INTRAMUSCULAR | Status: AC
  Filled 2022-06-10: qty 1

## 2022-06-10 MED ORDER — SODIUM CHLORIDE (PF) 0.9 % IJ SOLN
INTRAMUSCULAR | Status: DC | PRN
  Administered 2022-06-10: 30 mL

## 2022-06-10 MED ORDER — ASPIRIN 81 MG PO CHEW: 81.0000 mg | CHEWABLE_TABLET | Freq: Two times a day (BID) | ORAL | 0 refills | Status: AC

## 2022-06-10 MED ORDER — BUPIVACAINE IN DEXTROSE 0.75-8.25 % IT SOLN
INTRATHECAL | Status: DC | PRN
  Administered 2022-06-10: 1.7 mL via INTRATHECAL

## 2022-06-10 MED ORDER — TRANEXAMIC ACID-NACL 1000-0.7 MG/100ML-% IV SOLN
1000.0000 mg | Freq: Once | INTRAVENOUS | Status: AC
  Administered 2022-06-10: 1000 mg via INTRAVENOUS
  Filled 2022-06-10: qty 100

## 2022-06-10 MED ORDER — TIZANIDINE HCL 4 MG PO TABS: 4.0000 mg | ORAL_TABLET | Freq: Three times a day (TID) | ORAL | 1 refills | Status: AC | PRN

## 2022-06-10 MED ORDER — POVIDONE-IODINE 10 % EX SWAB: 2.0000 | Freq: Once | CUTANEOUS | Status: DC

## 2022-06-10 MED ORDER — GALCANEZUMAB-GNLM 120 MG/ML ~~LOC~~ SOAJ: 1.0000 mL | SUBCUTANEOUS | Status: DC

## 2022-06-10 MED ORDER — DEXAMETHASONE SODIUM PHOSPHATE 10 MG/ML IJ SOLN
INTRAMUSCULAR | Status: DC | PRN
  Administered 2022-06-10: 4 mg via INTRAVENOUS

## 2022-06-10 MED ORDER — PHENOL 1.4 % MT LIQD: 1.0000 | OROMUCOSAL | Status: DC | PRN

## 2022-06-10 MED ORDER — LORATADINE 10 MG PO TABS: 5.0000 mg | ORAL_TABLET | Freq: Every evening | ORAL | Status: DC

## 2022-06-10 MED ORDER — ONDANSETRON HCL 4 MG/2ML IJ SOLN
INTRAMUSCULAR | Status: DC | PRN
  Administered 2022-06-10: 4 mg via INTRAVENOUS

## 2022-06-10 MED ORDER — MUPIROCIN 2 % EX OINT
1.0000 | TOPICAL_OINTMENT | Freq: Once | CUTANEOUS | Status: DC
  Filled 2022-06-10: qty 22

## 2022-06-10 MED ORDER — EPHEDRINE SULFATE-NACL 50-0.9 MG/10ML-% IV SOSY
PREFILLED_SYRINGE | INTRAVENOUS | Status: DC | PRN
  Administered 2022-06-10 (×3): 5 mg via INTRAVENOUS

## 2022-06-10 MED ORDER — METOCLOPRAMIDE HCL 5 MG PO TABS: 5.0000 mg | ORAL_TABLET | Freq: Three times a day (TID) | ORAL | Status: DC | PRN

## 2022-06-10 MED ORDER — MENTHOL 3 MG MT LOZG: 1.0000 | LOZENGE | OROMUCOSAL | Status: DC | PRN

## 2022-06-10 MED ORDER — MIDAZOLAM HCL 2 MG/2ML IJ SOLN
INTRAMUSCULAR | Status: DC | PRN
  Administered 2022-06-10 (×2): 1 mg via INTRAVENOUS

## 2022-06-10 MED ORDER — DULOXETINE HCL 60 MG PO CPEP
60.0000 mg | ORAL_CAPSULE | Freq: Two times a day (BID) | ORAL | Status: DC
  Administered 2022-06-10 – 2022-06-11 (×2): 60 mg via ORAL
  Filled 2022-06-10 (×2): qty 1

## 2022-06-10 MED ORDER — METHOCARBAMOL 500 MG IVPB - SIMPLE MED: 500.0000 mg | Freq: Four times a day (QID) | INTRAVENOUS | Status: DC | PRN

## 2022-06-10 MED ORDER — METOCLOPRAMIDE HCL 5 MG/ML IJ SOLN: 5.0000 mg | Freq: Three times a day (TID) | INTRAMUSCULAR | Status: DC | PRN

## 2022-06-10 MED ORDER — TIZANIDINE HCL 4 MG PO TABS
4.0000 mg | ORAL_TABLET | Freq: Three times a day (TID) | ORAL | Status: DC | PRN
  Administered 2022-06-11: 4 mg via ORAL
  Filled 2022-06-10: qty 1

## 2022-06-10 MED ORDER — ASPIRIN 81 MG PO CHEW
81.0000 mg | CHEWABLE_TABLET | Freq: Two times a day (BID) | ORAL | Status: DC
  Administered 2022-06-10 – 2022-06-11 (×2): 81 mg via ORAL
  Filled 2022-06-10 (×2): qty 1

## 2022-06-10 MED ORDER — BUPIVACAINE LIPOSOME 1.3 % IJ SUSP
INTRAMUSCULAR | Status: AC
  Filled 2022-06-10: qty 20

## 2022-06-10 MED ORDER — CEFAZOLIN SODIUM-DEXTROSE 2-4 GM/100ML-% IV SOLN
2.0000 g | INTRAVENOUS | Status: AC
  Administered 2022-06-10: 2 g via INTRAVENOUS
  Filled 2022-06-10: qty 100

## 2022-06-10 MED ORDER — BUPIVACAINE LIPOSOME 1.3 % IJ SUSP: 20.0000 mL | Freq: Once | INTRAMUSCULAR | Status: DC

## 2022-06-10 MED ORDER — ROCURONIUM BROMIDE 10 MG/ML (PF) SYRINGE
PREFILLED_SYRINGE | INTRAVENOUS | Status: AC
  Filled 2022-06-10: qty 10

## 2022-06-10 MED ORDER — VALACYCLOVIR HCL 500 MG PO TABS: 1000.0000 mg | ORAL_TABLET | Freq: Two times a day (BID) | ORAL | Status: DC | PRN

## 2022-06-10 MED ORDER — EPINEPHRINE PF 1 MG/ML IJ SOLN
INTRAMUSCULAR | Status: AC
  Filled 2022-06-10: qty 1

## 2022-06-10 MED ORDER — DOCUSATE SODIUM 100 MG PO CAPS
100.0000 mg | ORAL_CAPSULE | Freq: Two times a day (BID) | ORAL | Status: DC
  Administered 2022-06-10 – 2022-06-11 (×2): 100 mg via ORAL
  Filled 2022-06-10 (×2): qty 1

## 2022-06-10 MED ORDER — BUPROPION HCL ER (XL) 150 MG PO TB24
150.0000 mg | ORAL_TABLET | Freq: Every morning | ORAL | Status: DC
  Administered 2022-06-11: 150 mg via ORAL
  Filled 2022-06-10: qty 1

## 2022-06-10 MED ORDER — CLONIDINE HCL (ANALGESIA) 100 MCG/ML EP SOLN
EPIDURAL | Status: DC | PRN
  Administered 2022-06-10: 100 ug

## 2022-06-10 MED ORDER — EPHEDRINE 5 MG/ML INJ
INTRAVENOUS | Status: AC
  Filled 2022-06-10: qty 15

## 2022-06-10 MED ORDER — METHOCARBAMOL 500 MG PO TABS
500.0000 mg | ORAL_TABLET | Freq: Four times a day (QID) | ORAL | Status: DC | PRN
  Administered 2022-06-10 – 2022-06-11 (×2): 500 mg via ORAL
  Filled 2022-06-10 (×2): qty 1

## 2022-06-10 MED ORDER — CHLORHEXIDINE GLUCONATE 0.12 % MT SOLN
15.0000 mL | Freq: Once | OROMUCOSAL | Status: AC
  Administered 2022-06-10: 15 mL via OROMUCOSAL

## 2022-06-10 MED ORDER — ROPIVACAINE HCL 5 MG/ML IJ SOLN
INTRAMUSCULAR | Status: DC | PRN
  Administered 2022-06-10: 30 mL via PERINEURAL

## 2022-06-10 MED ORDER — METOPROLOL SUCCINATE ER 50 MG PO TB24
50.0000 mg | ORAL_TABLET | Freq: Every morning | ORAL | Status: DC
  Administered 2022-06-11: 50 mg via ORAL
  Filled 2022-06-10: qty 1

## 2022-06-10 MED ORDER — MIDAZOLAM HCL 2 MG/2ML IJ SOLN
1.0000 mg | INTRAMUSCULAR | Status: DC
  Administered 2022-06-10: 2 mg via INTRAVENOUS
  Filled 2022-06-10: qty 2

## 2022-06-10 MED ORDER — HYDROMORPHONE HCL 1 MG/ML IJ SOLN
0.5000 mg | INTRAMUSCULAR | Status: DC | PRN
  Administered 2022-06-10 – 2022-06-11 (×2): 1 mg via INTRAVENOUS
  Filled 2022-06-10 (×2): qty 1

## 2022-06-10 MED ORDER — PROPOFOL 10 MG/ML IV BOLUS
INTRAVENOUS | Status: AC
  Filled 2022-06-10: qty 20

## 2022-06-10 MED ORDER — SUMATRIPTAN SUCCINATE 50 MG PO TABS: 100.0000 mg | ORAL_TABLET | ORAL | Status: DC | PRN

## 2022-06-10 MED ORDER — PHENYLEPHRINE HCL-NACL 20-0.9 MG/250ML-% IV SOLN
INTRAVENOUS | Status: DC | PRN
  Administered 2022-06-10: 20 ug/min via INTRAVENOUS

## 2022-06-10 MED ORDER — DEXMEDETOMIDINE HCL IN NACL 80 MCG/20ML IV SOLN
INTRAVENOUS | Status: AC
  Filled 2022-06-10: qty 20

## 2022-06-10 SURGICAL SUPPLY — 56 items
ATTUNE PSFEM RTSZ5 NARCEM KNEE (Femur) IMPLANT
ATTUNE PSRP INSR SZ5 6 KNEE (Insert) IMPLANT
BAG COUNTER SPONGE SURGICOUNT (BAG) IMPLANT
BAG SPEC THK2 15X12 ZIP CLS (MISCELLANEOUS)
BAG SPNG CNTER NS LX DISP (BAG)
BAG ZIPLOCK 12X15 (MISCELLANEOUS) IMPLANT
BASE TIBIAL ROT PLAT SZ 5 KNEE (Knees) IMPLANT
BLADE SAG 18X100X1.27 (BLADE) ×1 IMPLANT
BLADE SAW SGTL 13X75X1.27 (BLADE) ×1 IMPLANT
BNDG CMPR MED 10X6 ELC LF (GAUZE/BANDAGES/DRESSINGS) ×1
BNDG ELASTIC 6X10 VLCR STRL LF (GAUZE/BANDAGES/DRESSINGS) ×1 IMPLANT
BNDG GAUZE DERMACEA FLUFF 4 (GAUZE/BANDAGES/DRESSINGS) ×1 IMPLANT
BNDG GZE DERMACEA 4 6PLY (GAUZE/BANDAGES/DRESSINGS) ×1
BOWL SMART MIX CTS (DISPOSABLE) ×1 IMPLANT
BSPLAT TIB 5 CMNT ROT PLAT STR (Knees) ×1 IMPLANT
CEMENT HV SMART SET (Cement) ×2 IMPLANT
CLSR STERI-STRIP ANTIMIC 1/2X4 (GAUZE/BANDAGES/DRESSINGS) IMPLANT
COVER SURGICAL LIGHT HANDLE (MISCELLANEOUS) ×1 IMPLANT
CUFF TOURN SGL QUICK 34 (TOURNIQUET CUFF) ×1
CUFF TRNQT CYL 34X4.125X (TOURNIQUET CUFF) ×1 IMPLANT
DRAPE INCISE IOBAN 66X45 STRL (DRAPES) ×1 IMPLANT
DRAPE SHEET LG 3/4 BI-LAMINATE (DRAPES) ×1 IMPLANT
DRAPE U-SHAPE 47X51 STRL (DRAPES) ×1 IMPLANT
DRSG ADAPTIC 3X8 NADH LF (GAUZE/BANDAGES/DRESSINGS) ×1 IMPLANT
DURAPREP 26ML APPLICATOR (WOUND CARE) ×1 IMPLANT
ELECT REM PT RETURN 15FT ADLT (MISCELLANEOUS) ×1 IMPLANT
GAUZE PAD ABD 8X10 STRL (GAUZE/BANDAGES/DRESSINGS) ×1 IMPLANT
GAUZE SPONGE 4X4 12PLY STRL (GAUZE/BANDAGES/DRESSINGS) ×1 IMPLANT
GLOVE BIOGEL PI IND STRL 7.5 (GLOVE) ×1 IMPLANT
GLOVE BIOGEL PI IND STRL 8.5 (GLOVE) ×1 IMPLANT
GLOVE ORTHO TXT STRL SZ7.5 (GLOVE) ×1 IMPLANT
GLOVE SURG ORTHO 8.5 STRL (GLOVE) ×2 IMPLANT
GOWN STRL REUS W/ TWL XL LVL3 (GOWN DISPOSABLE) ×2 IMPLANT
GOWN STRL REUS W/TWL XL LVL3 (GOWN DISPOSABLE) ×2
HANDPIECE INTERPULSE COAX TIP (DISPOSABLE) ×1
HOLDER FOLEY CATH W/STRAP (MISCELLANEOUS) IMPLANT
IMMOBILIZER KNEE 20 (SOFTGOODS) ×1
IMMOBILIZER KNEE 20 THIGH 36 (SOFTGOODS) IMPLANT
KIT TURNOVER KIT A (KITS) IMPLANT
MANIFOLD NEPTUNE II (INSTRUMENTS) ×1 IMPLANT
NS IRRIG 1000ML POUR BTL (IV SOLUTION) ×1 IMPLANT
PACK TOTAL KNEE CUSTOM (KITS) ×1 IMPLANT
PATELLA MEDIAL ATTUN 35MM KNEE (Knees) IMPLANT
PIN STEINMAN FIXATION KNEE (PIN) IMPLANT
PROTECTOR NERVE ULNAR (MISCELLANEOUS) ×1 IMPLANT
SET HNDPC FAN SPRY TIP SCT (DISPOSABLE) ×1 IMPLANT
STAPLER VISISTAT 35W (STAPLE) IMPLANT
STRIP CLOSURE SKIN 1/2X4 (GAUZE/BANDAGES/DRESSINGS) ×2 IMPLANT
SUT MNCRL AB 3-0 PS2 18 (SUTURE) ×1 IMPLANT
SUT VIC AB 0 CT1 36 (SUTURE) ×1 IMPLANT
SUT VIC AB 1 CT1 36 (SUTURE) ×2 IMPLANT
SUT VIC AB 2-0 CT1 27 (SUTURE) ×1
SUT VIC AB 2-0 CT1 TAPERPNT 27 (SUTURE) ×1 IMPLANT
TIBIAL BASE ROT PLAT SZ 5 KNEE (Knees) ×1 IMPLANT
WATER STERILE IRR 1000ML POUR (IV SOLUTION) ×2 IMPLANT
YANKAUER SUCT BULB TIP NO VENT (SUCTIONS) ×1 IMPLANT

## 2022-06-10 NOTE — Interval H&P Note (Signed)
History and Physical Interval Note:  06/10/2022 11:26 AM  Elizabeth Cohen  has presented today for surgery, with the diagnosis of right knee osteoarthritis.  The various methods of treatment have been discussed with the patient and family. After consideration of risks, benefits and other options for treatment, the patient has consented to  Procedure(s) with comments: TOTAL KNEE ARTHROPLASTY (Right) - general and spinal as a surgical intervention.  The patient's history has been reviewed, patient examined, no change in status, stable for surgery.  I have reviewed the patient's chart and labs.  Questions were answered to the patient's satisfaction.     Verlee Rossetti

## 2022-06-10 NOTE — Anesthesia Procedure Notes (Signed)
Anesthesia Regional Block: Adductor canal block   Pre-Anesthetic Checklist: , timeout performed,  Correct Patient, Correct Site, Correct Laterality,  Correct Procedure, Correct Position, site marked,  Risks and benefits discussed,  Surgical consent,  Pre-op evaluation,  At surgeon's request and post-op pain management  Laterality: Lower and Right  Prep: chloraprep       Needles:  Injection technique: Single-shot  Needle Type: Echogenic Needle     Needle Length: 9cm  Needle Gauge: 22     Additional Needles:   Procedures:,,,, ultrasound used (permanent image in chart),,    Narrative:  Start time: 06/10/2022 12:18 PM End time: 06/10/2022 12:24 PM Injection made incrementally with aspirations every 5 mL.  Performed by: Personally  Anesthesiologist: Trevor Iha, MD  Additional Notes: Block assessed prior to surgery. Pt tolerated procedure well.

## 2022-06-10 NOTE — Brief Op Note (Signed)
06/10/2022  3:06 PM  PATIENT:  Elizabeth Cohen  62 y.o. female  PRE-OPERATIVE DIAGNOSIS:  right knee osteoarthritis, end stage  POST-OPERATIVE DIAGNOSIS:  right knee osteoarthritis, end stage  PROCEDURE:  Procedure(s): TOTAL KNEE ARTHROPLASTY (Right) DePuy Attune  SURGEON:  Surgeon(s) and Role:    Beverely Low, MD - Primary  PHYSICIAN ASSISTANT:   ASSISTANTS: Thea Gist, PA-C   ANESTHESIA:   regional and spinal  EBL:  50 mL   BLOOD ADMINISTERED:none  DRAINS: none   LOCAL MEDICATIONS USED:  MARCAINE     SPECIMEN:  No Specimen  DISPOSITION OF SPECIMEN:  N/A  COUNTS:  YES  TOURNIQUET:   Total Tourniquet Time Documented: Thigh (Right) - 74 minutes Total: Thigh (Right) - 74 minutes   DICTATION: .Other Dictation: Dictation Number [id number]  PLAN OF CARE: Admit for overnight observation  PATIENT DISPOSITION:  PACU - hemodynamically stable.   Delay start of Pharmacological VTE agent (>24hrs) due to surgical blood loss or risk of bleeding: no

## 2022-06-10 NOTE — Anesthesia Procedure Notes (Addendum)
Spinal  Patient location during procedure: OR Start time: 06/10/2022 1:12 PM End time: 06/10/2022 1:17 PM Reason for block: surgical anesthesia Staffing Anesthesiologist: Bethena Midget, MD Performed by: Bethena Midget, MD Authorized by: Mal Amabile, MD   Preanesthetic Checklist Completed: patient identified, IV checked, site marked, risks and benefits discussed, surgical consent, monitors and equipment checked, pre-op evaluation and timeout performed Spinal Block Patient position: sitting Prep: ChloraPrep Patient monitoring: heart rate, cardiac monitor, continuous pulse ox and blood pressure Approach: midline Location: L3-4 Injection technique: single-shot Needle Needle type: Sprotte  Needle gauge: 24 G Needle length: 9 cm Assessment Sensory level: T4 Events: CSF return

## 2022-06-10 NOTE — Progress Notes (Signed)
Orthopedic Tech Progress Note Patient Details:  Elizabeth Cohen [birth year] [id number] Remove at 10pm. CPM Right Knee CPM Right Knee: On Right Knee Flexion (Degrees): 70 Right Knee Extension (Degrees): 0  Post Interventions Patient Tolerated: Well  [name] 06/10/2022, 6:18 PM

## 2022-06-10 NOTE — Discharge Instructions (Addendum)
Ice to the knee constantly.  Keep the incision covered and clean and dry for one week, then ok to get it wet in the shower. Leave incision open to air after one week.   Do exercise as instructed every hour, please to prevent stiffness.    DO NOT prop anything under the knee, it will make your knee stiff.  Prop under the ankle to encourage your knee to go straight.   Use the walker while you are up and around for balance.  Wear your support stockings 24/7 to prevent blood clots and take baby aspirin twice daily for 30 days also to prevent blood clots  Follow up with Dr Ranell Patrick in two weeks in the office, call [phone] for appt  Please call Dr Ranell Patrick at [phone] (cell) with any  questions or concerns  INSTRUCTIONS AFTER JOINT REPLACEMENT   Remove items at home which could result in a fall. This includes throw rugs or furniture in walking pathways ICE to the affected joint every three hours while awake for 30 minutes at a time, for at least the first 3-5 days, and then as needed for pain and swelling.  Continue to use ice for pain and swelling. You may notice swelling that will progress down to the foot and ankle.  This is normal after surgery.  Elevate your leg when you are not up walking on it.   Continue to use the breathing machine you got in the hospital (incentive spirometer) which will help keep your temperature down.  It is common for your temperature to cycle up and down following surgery, especially at night when you are not up moving around and exerting yourself.  The breathing machine keeps your lungs expanded and your temperature down.   DIET:  As you were doing prior to hospitalization, we recommend a well-balanced diet.  DRESSING / WOUND CARE / SHOWERING  You may change your dressing 3-5 days after surgery.  Then change the dressing every day with sterile gauze.  Please use good hand washing techniques before changing the dressing.  Do not use any lotions or creams on the  incision until instructed by your surgeon.  ACTIVITY  Increase activity slowly as tolerated, but follow the weight bearing instructions below.   No driving for 6 weeks or until further direction given by your physician.  You cannot drive while taking narcotics.  No lifting or carrying greater than 10 lbs. until further directed by your surgeon. Avoid periods of inactivity such as sitting longer than an hour when not asleep. This helps prevent blood clots.  You may return to work once you are authorized by your doctor.     WEIGHT BEARING   Weight bearing as tolerated with assist device (walker, cane, etc) as directed, use it as long as suggested by your surgeon or therapist, typically at least 4-6 weeks.   EXERCISES  Results after joint replacement surgery are often greatly improved when you follow the exercise, range of motion and muscle strengthening exercises prescribed by your doctor. Safety measures are also important to protect the joint from further injury. Any time any of these exercises cause you to have increased pain or swelling, decrease what you are doing until you are comfortable again and then slowly increase them. If you have problems or questions, call your caregiver or physical therapist for advice.   Rehabilitation is important following a joint replacement. After just a few days of immobilization, the muscles of the leg can become weakened  and shrink (atrophy).  These exercises are designed to build up the tone and strength of the thigh and leg muscles and to improve motion. Often times heat used for twenty to thirty minutes before working out will loosen up your tissues and help with improving the range of motion but do not use heat for the first two weeks following surgery (sometimes heat can increase post-operative swelling).   These exercises can be done on a training (exercise) mat, on the floor, on a table or on a bed. Use whatever works the best and is most  comfortable for you.    Use music or television while you are exercising so that the exercises are a pleasant break in your day. This will make your life better with the exercises acting as a break in your routine that you can look forward to.   Perform all exercises about fifteen times, three times per day or as directed.  You should exercise both the operative leg and the other leg as well.  Exercises include:   Quad Sets - Tighten up the muscle on the front of the thigh (Quad) and hold for 5-10 seconds.   Straight Leg Raises - With your knee straight (if you were given a brace, keep it on), lift the leg to 60 degrees, hold for 3 seconds, and slowly lower the leg.  Perform this exercise against resistance later as your leg gets stronger.  Leg Slides: Lying on your back, slowly slide your foot toward your buttocks, bending your knee up off the floor (only go as far as is comfortable). Then slowly slide your foot back down until your leg is flat on the floor again.  Angel Wings: Lying on your back spread your legs to the side as far apart as you can without causing discomfort.  Hamstring Strength:  Lying on your back, push your heel against the floor with your leg straight by tightening up the muscles of your buttocks.  Repeat, but this time bend your knee to a comfortable angle, and push your heel against the floor.  You may put a pillow under the heel to make it more comfortable if necessary.   A rehabilitation program following joint replacement surgery can speed recovery and prevent re-injury in the future due to weakened muscles. Contact your doctor or a physical therapist for more information on knee rehabilitation.    CONSTIPATION  Constipation is defined medically as fewer than three stools per week and severe constipation as less than one stool per week.  Even if you have a regular bowel pattern at home, your normal regimen is likely to be disrupted due to multiple reasons following surgery.   Combination of anesthesia, postoperative narcotics, change in appetite and fluid intake all can affect your bowels.   YOU MUST use at least one of the following options; they are listed in order of increasing strength to get the job done.  They are all available over the counter, and you may need to use some, POSSIBLY even all of these options:    Drink plenty of fluids (prune juice may be helpful) and high fiber foods Colace 100 mg by mouth twice a day  Senokot for constipation as directed and as needed Dulcolax (bisacodyl), take with full glass of water  Miralax (polyethylene glycol) once or twice a day as needed.  If you have tried all these things and are unable to have a bowel movement in the first 3-4 days after surgery call either your  surgeon or your primary doctor.    If you experience loose stools or diarrhea, hold the medications until you stool forms back up.  If your symptoms do not get better within 1 week or if they get worse, check with your doctor.  If you experience "the worst abdominal pain ever" or develop nausea or vomiting, please contact the office immediately for further recommendations for treatment.   ITCHING:  If you experience itching with your medications, try taking only a single pain pill, or even half a pain pill at a time.  You can also use Benadryl over the counter for itching or also to help with sleep.   TED HOSE STOCKINGS:  Use stockings on both legs until for at least 2 weeks or as directed by physician office. They may be removed at night for sleeping.  MEDICATIONS:  See your medication summary on the "After Visit Summary" that nursing will review with you.  You may have some home medications which will be placed on hold until you complete the course of blood thinner medication.  It is important for you to complete the blood thinner medication as prescribed.  PRECAUTIONS:  If you experience chest pain or shortness of breath - call 911 immediately for  transfer to the hospital emergency department.   If you develop a fever greater that 101 F, purulent drainage from wound, increased redness or drainage from wound, foul odor from the wound/dressing, or calf pain - CONTACT YOUR SURGEON.                                                   FOLLOW-UP APPOINTMENTS:  If you do not already have a post-op appointment, please call the office for an appointment to be seen by your surgeon.  Guidelines for how soon to be seen are listed in your "After Visit Summary", but are typically between 1-4 weeks after surgery.  OTHER INSTRUCTIONS:   Knee Replacement:  Do not place pillow under knee, focus on keeping the knee straight while resting. CPM instructions: 0-90 degrees, 2 hours in the morning, 2 hours in the afternoon, and 2 hours in the evening. Place foam block, curve side up under heel at all times except when in CPM or when walking.  DO NOT modify, tear, cut, or change the foam block in any way.  POST-OPERATIVE OPIOID TAPER INSTRUCTIONS: It is important to wean off of your opioid medication as soon as possible. If you do not need pain medication after your surgery it is ok to stop day one. Opioids include: Codeine, Hydrocodone(Norco, Vicodin), Oxycodone(Percocet, oxycontin) and hydromorphone amongst others.  Long term and even short term use of opiods can cause: Increased pain response Dependence Constipation Depression Respiratory depression And more.  Withdrawal symptoms can include Flu like symptoms Nausea, vomiting And more Techniques to manage these symptoms Hydrate well Eat regular healthy meals Stay active Use relaxation techniques(deep breathing, meditating, yoga) Do Not substitute Alcohol to help with tapering If you have been on opioids for less than two weeks and do not have pain than it is ok to stop all together.  Plan to wean off of opioids This plan should start within one week post op of your joint replacement. Maintain the  same interval or time between taking each dose and first decrease the dose.  Cut the total daily intake  of opioids by one tablet each day Next start to increase the time between doses. The last dose that should be eliminated is the evening dose.   MAKE SURE YOU:  Understand these instructions.  Get help right away if you are not doing well or get worse.    Thank you for letting us be a part of your medical care team.  It is a privilege we respect greatly.  We hope these instructions will help you stay on track for a fast and full recovery!

## 2022-06-10 NOTE — Progress Notes (Signed)
Orthopedic Tech Progress Note Patient Details:  Elizabeth Cohen 26-Aug-1960 [id number] CPM was not applied due to patient not being in the correct hospital bed. Ortho Devices Type of Ortho Device: Bone foam zero knee Ortho Device/Splint Location: RLE Ortho Device/Splint Interventions: Application   Post Interventions Patient Tolerated: Well  Elizabeth Cohen 06/10/2022, 3:49 PM

## 2022-06-10 NOTE — Anesthesia Procedure Notes (Signed)
Procedure Name: MAC Date/Time: 06/10/2022 1:10 PM  Performed by: Sindy Guadeloupe, CRNAPre-anesthesia Checklist: Patient identified, Emergency Drugs available, Suction available, Patient being monitored and Timeout performed Oxygen Delivery Method: Simple face mask Placement Confirmation: positive ETCO2

## 2022-06-10 NOTE — Transfer of Care (Signed)
Immediate Anesthesia Transfer of Care Note  Patient: Elizabeth Cohen  Procedure(s) Performed: TOTAL KNEE ARTHROPLASTY (Right: Knee)  Patient Location: PACU  Anesthesia Type:Spinal  Level of Consciousness: drowsy and patient cooperative  Airway & Oxygen Therapy: Patient Spontanous Breathing and Patient connected to face mask oxygen  Post-op Assessment: Report given to RN and Post -op Vital signs reviewed and stable  Post vital signs: Reviewed and stable  Last Vitals:  Vitals Value Taken Time  BP 104/64 06/10/22 1511  Temp    Pulse 65 06/10/22 1511  Resp 17 06/10/22 1511  SpO2 100 % 06/10/22 1511  Vitals shown include unvalidated device data.  Last Pain:  Vitals:   06/10/22 1511  TempSrc:   PainSc: Asleep      Patients Stated Pain Goal: 4 (06/10/22 0940)  Complications: No notable events documented.

## 2022-06-11 DIAGNOSIS — M1711 Unilateral primary osteoarthritis, right knee: Secondary | ICD-10-CM | POA: Diagnosis not present

## 2022-06-11 LAB — BASIC METABOLIC PANEL
Anion gap: 11 (ref 5–15)
BUN: 16 mg/dL (ref 8–23)
CO2: 22 mmol/L (ref 22–32)
Calcium: 8.6 mg/dL — ABNORMAL LOW (ref 8.9–10.3)
Chloride: 107 mmol/L (ref 98–111)
Creatinine, Ser: 0.94 mg/dL (ref 0.44–1.00)
GFR, Estimated: >60 mL/min (ref >60)
Glucose, Bld: 133 mg/dL — ABNORMAL HIGH (ref 70–99)
Potassium: 3.8 mmol/L (ref 3.5–5.1)
Sodium: 140 mmol/L (ref 135–145)

## 2022-06-11 LAB — CBC
HCT: 35.5 % — ABNORMAL LOW (ref 36.0–46.0)
Hemoglobin: 11.4 g/dL — ABNORMAL LOW (ref 12.0–15.0)
MCH: 29.8 pg (ref 26.0–34.0)
MCHC: 32.1 g/dL (ref 30.0–36.0)
MCV: 92.7 fL (ref 80.0–100.0)
Platelets: 295 10*3/uL (ref 150–400)
RBC: 3.83 MIL/uL — ABNORMAL LOW (ref 3.87–5.11)
RDW: 13 % (ref 11.5–15.5)
WBC: 14.5 10*3/uL — ABNORMAL HIGH (ref 4.0–10.5)
nRBC: 0 % (ref 0.0–0.2)

## 2022-06-11 NOTE — Progress Notes (Signed)
PT TX NOTE  06/11/22 1400  PT Visit Information  Last PT Received On 06/11/22  Assistance Needed Pt progressing well, exercise focus session since pt just back to bed with nursing staff. No significant incr pain after TKA HEP, pt tol well. Mobility goals met this am; pt and husband feel ready to d/c home today.   History of Present Illness 62 yo female s/p R TKA on 06/10/22. PMH: PLIF, HTN, fibromyalgia  Precautions  Precautions Fall;Knee  Restrictions  Weight Bearing Restrictions No  Other Position/Activity Restrictions WBAT  Pain Assessment  Pain Assessment 0-10  Pain Score 5  Pain Location L knee  Pain Descriptors / Indicators Aching  Pain Intervention(s) Limited activity within patient's tolerance;Monitored during session;Premedicated before session  Cognition  Arousal/Alertness Awake/alert  Behavior During Therapy WFL for tasks assessed/performed  Overall Cognitive Status Within Functional Limits for tasks assessed  Bed Mobility  General bed mobility comments pt just back to bed with nursing staff  Total Joint Exercises  Ankle Circles/Pumps AROM;Both;10 reps  Quad Sets AROM;Both;10 reps  Gluteal Sets AROM;10 reps;Both  Heel Slides AAROM;Right;10 reps  Hip ABduction/ADduction AROM;AAROM;Right;10 reps  Straight Leg Raises AROM;Both;10 reps  Goniometric ROM grossly 5 to 70 degrees AAROM right knee flexion  PT - End of Session  Equipment Utilized During Treatment Gait belt  Activity Tolerance Patient tolerated treatment well  Patient left in bed;with call bell/phone within reach;with bed alarm set   PT - Assessment/Plan  PT Plan Current plan remains appropriate  PT Visit Diagnosis Other abnormalities of gait and mobility (R26.89);Difficulty in walking, not elsewhere classified (R26.2)  PT Frequency (ACUTE ONLY) 7X/week  Follow Up Recommendations Follow physician's recommendations for discharge plan and follow up therapies  Assistance recommended at discharge Intermittent  Supervision/Assistance  Patient can return home with the following A little help with bathing/dressing/bathroom;Assist for transportation;Help with stairs or ramp for entrance;Assistance with cooking/housework  PT equipment Rolling walker (2 wheels)  AM-PAC PT "6 Clicks" Mobility Outcome Measure (Version 2)  Help needed turning from your back to your side while in a flat bed without using bedrails? 3  Help needed moving from lying on your back to sitting on the side of a flat bed without using bedrails? 3  Help needed moving to and from a bed to a chair (including a wheelchair)? 3  Help needed standing up from a chair using your arms (e.g., wheelchair or bedside chair)? 3  Help needed to walk in hospital room? 3  Help needed climbing 3-5 steps with a railing?  3  6 Click Score 18  Consider Recommendation of Discharge To: Home with [facility]  PT Goal Progression  Progress towards PT goals Progressing toward goals  Acute Rehab PT Goals  PT Goal Formulation With patient  Time For Goal Achievement 06/17/22  Potential to Achieve Goals Good  PT Time Calculation  PT Start Time (ACUTE ONLY) 1358  PT Stop Time (ACUTE ONLY) 1420  PT Time Calculation (min) (ACUTE ONLY) 22 min  PT General Charges  $$ ACUTE PT VISIT 1 Visit  PT Treatments  $Therapeutic Exercise 8-22 mins

## 2022-06-11 NOTE — Progress Notes (Signed)
Nurse reviewed discharge instructions with pt.  Pt verbalized understanding of discharge instructions, follow up appointments and new medications.  Walker delivered to room.  No concerns at time of discharge.

## 2022-06-11 NOTE — Discharge Summary (Signed)
In most cases prophylactic antibiotics for Dental procdeures after total joint surgery are not necessary.  Exceptions are as follows:  1. History of prior total joint infection  2. Severely immunocompromised (Organ Transplant, cancer chemotherapy, Rheumatoid biologic meds such as Humera)  3. Poorly controlled diabetes (A1C &gt; 8.0, blood glucose over 200)  If you have one of these conditions, contact your surgeon for an antibiotic prescription, prior to your dental procedure. Orthopedic Discharge Summary        Physician Discharge Summary  Patient ID: Elizabeth Cohen MRN: [id number] DOB/AGE: [birth year] 62 y.o.  Admit date: 06/10/2022 Discharge date: 06/11/2022   Procedures:  Procedure(s) (LRB): TOTAL KNEE ARTHROPLASTY (Right)  Attending Physician:  Dr. Malon Kindle  Admission Diagnoses:   Right knee end stage OA  Discharge Diagnoses:  same   Past Medical History:  Diagnosis Date   Acid reflux    Anxiety    OCD   Arthritis    Bell's palsy 03/2019   Cancer (HCC)    Skin right arm and left ear, left breast   Complication of anesthesia    slow to wake up during wisdom teeth ext, no other problems with the other surgery   Depression    Fibromyalgia    History of skin cancer    Basal cell, squamous cell   right arm and left ear, left breast    Hypertension    Migraine    Palpitations    PONV (postoperative nausea and vomiting)    Positive ANA (antinuclear antibody)    no problems, no meds    PCP: Richardean Chimera, MD   Discharged Condition: good  Hospital Course:  Patient underwent the above stated procedure on 06/10/2022. Patient tolerated the procedure well and brought to the recovery room in good condition and subsequently to the floor. Patient had an uncomplicated hospital course and was stable for discharge.   Disposition: Discharge disposition: 01-Home or Self Care      with follow up in 2 weeks    Follow-up Information     Beverely Low, MD. Call in 2 week(s).   Specialty: Orthopedic Surgery Why: call [phone] for appt in two weeks in the office Contact information: [address] [place] [address] [phone]                 Dental Antibiotics:  In most cases prophylactic antibiotics for Dental procdeures after total joint surgery are not necessary.  Exceptions are as follows:  1. History of prior total joint infection  2. Severely immunocompromised (Organ Transplant, cancer chemotherapy, Rheumatoid biologic meds such as Humera)  3. Poorly controlled diabetes (A1C &gt; 8.0, blood glucose over 200)  If you have one of these conditions, contact your surgeon for an antibiotic prescription, prior to your dental procedure.  Discharge Instructions     Call MD / Call 911   Complete by: As directed    If you experience chest pain or shortness of breath, CALL 911 and be transported to the hospital emergency room.  If you develope a fever above 101 F, pus (white drainage) or increased drainage or redness at the wound, or calf pain, call your surgeon's office.   Call MD / Call 911   Complete by: As directed    If you experience chest pain or shortness of breath, CALL 911 and be transported to the hospital emergency room.  If you develope a fever above 101 F, pus (white drainage) or increased  drainage or redness at the wound, or calf pain, call your surgeon's office.   Constipation Prevention   Complete by: As directed    Drink plenty of fluids.  Prune juice may be helpful.  You may use a stool softener, such as Colace (over the counter) 100 mg twice a day.  Use MiraLax (over the counter) for constipation as needed.   Constipation Prevention   Complete by: As directed    Drink plenty of fluids.  Prune juice may be helpful.  You may use a stool softener, such as Colace (over the counter) 100 mg twice a day.  Use MiraLax (over the counter) for constipation as needed.   Diet - low  sodium heart healthy   Complete by: As directed    Diet - low sodium heart healthy   Complete by: As directed    Increase activity slowly as tolerated   Complete by: As directed    Increase activity slowly as tolerated   Complete by: As directed    Post-operative opioid taper instructions:   Complete by: As directed    POST-OPERATIVE OPIOID TAPER INSTRUCTIONS: It is important to wean off of your opioid medication as soon as possible. If you do not need pain medication after your surgery it is ok to stop day one. Opioids include: Codeine, Hydrocodone(Norco, Vicodin), Oxycodone(Percocet, oxycontin) and hydromorphone amongst others.  Long term and even short term use of opiods can cause: Increased pain response Dependence Constipation Depression Respiratory depression And more.  Withdrawal symptoms can include Flu like symptoms Nausea, vomiting And more Techniques to manage these symptoms Hydrate well Eat regular healthy meals Stay active Use relaxation techniques(deep breathing, meditating, yoga) Do Not substitute Alcohol to help with tapering If you have been on opioids for less than two weeks and do not have pain than it is ok to stop all together.  Plan to wean off of opioids This plan should start within one week post op of your joint replacement. Maintain the same interval or time between taking each dose and first decrease the dose.  Cut the total daily intake of opioids by one tablet each day Next start to increase the time between doses. The last dose that should be eliminated is the evening dose.      Post-operative opioid taper instructions:   Complete by: As directed    POST-OPERATIVE OPIOID TAPER INSTRUCTIONS: It is important to wean off of your opioid medication as soon as possible. If you do not need pain medication after your surgery it is ok to stop day one. Opioids include: Codeine, Hydrocodone(Norco, Vicodin), Oxycodone(Percocet, oxycontin) and  hydromorphone amongst others.  Long term and even short term use of opiods can cause: Increased pain response Dependence Constipation Depression Respiratory depression And more.  Withdrawal symptoms can include Flu like symptoms Nausea, vomiting And more Techniques to manage these symptoms Hydrate well Eat regular healthy meals Stay active Use relaxation techniques(deep breathing, meditating, yoga) Do Not substitute Alcohol to help with tapering If you have been on opioids for less than two weeks and do not have pain than it is ok to stop all together.  Plan to wean off of opioids This plan should start within one week post op of your joint replacement. Maintain the same interval or time between taking each dose and first decrease the dose.  Cut the total daily intake of opioids by one tablet each day Next start to increase the time between doses. The last dose that should be eliminated  is the evening dose.          Allergies as of 06/11/2022       Reactions   Sulfur Hives   Celecoxib Hives   Codeine Itching, Nausea And Vomiting   Epinephrine Other (See Comments)   dizziness   Gabapentin Other (See Comments)   Altered mental state   Latex    Skin blisters   Morphine Hives   Naproxen Other (See Comments)   Gerd   Povidone-iodine    Betadine cause skin blisters   Rabeprazole Nausea Only, Other (See Comments)   Nausea, abdominal pain   Tape Other (See Comments)   Surgical/ Blisters Bandage adhesive/ Blisters Band-aids/ Blisters   Topamax [topiramate]    Altered mental state   Sulfonamide Derivatives Rash        Medication List     TAKE these medications    amLODipine 5 MG tablet Commonly known as: NORVASC Take 5 mg by mouth in the morning.   aspirin 81 MG chewable tablet Commonly known as: Aspirin Childrens Chew 1 tablet (81 mg total) by mouth 2 (two) times daily.   buPROPion 150 MG 24 hr tablet Commonly known as: WELLBUTRIN XL Take 150 mg by  mouth in the morning.   DULoxetine 60 MG capsule Commonly known as: CYMBALTA Take 60 mg by mouth 2 (two) times daily.   Emgality 120 MG/ML Soaj Generic drug: Galcanezumab-gnlm Inject 1 mL into the skin every 30 (thirty) days.   ESTROVEN PO Take 1 capsule by mouth in the morning.   fluticasone 50 MCG/ACT nasal spray Commonly known as: FLONASE Place 1 spray into both nostrils daily.   furosemide 20 MG tablet Commonly known as: LASIX Take 1 tablet (20 mg total) by mouth daily as needed (leg swelling).   levocetirizine 5 MG tablet Commonly known as: XYZAL Take 2.5 mg by mouth every evening.   metoprolol succinate 50 MG 24 hr tablet Commonly known as: TOPROL-XL Take 50 mg by mouth in the morning.   ondansetron 4 MG tablet Commonly known as: Zofran Take 1 tablet (4 mg total) by mouth every 8 (eight) hours as needed for refractory nausea / vomiting, vomiting or nausea.   oxyCODONE-acetaminophen 10-325 MG tablet Commonly known as: PERCOCET Take 1 tablet by mouth every 4 (four) hours as needed for pain. What changed:  when to take this reasons to take this   pantoprazole 40 MG tablet Commonly known as: PROTONIX TAKE 1 TABLET BY MOUTH TWICE DAILY . APPOINTMENT REQUIRED FOR FUTURE REFILLS   rizatriptan 10 MG tablet Commonly known as: MAXALT Take 10 mg by mouth daily as needed for migraine. May repeat in 2 hours if needed   tiZANidine 4 MG tablet Commonly known as: ZANAFLEX Take 1 tablet (4 mg total) by mouth every 8 (eight) hours as needed for muscle spasms. What changed:  when to take this reasons to take this   valACYclovir 1000 MG tablet Commonly known as: VALTREX Take 1,000 mg by mouth 2 (two) times daily as needed (cold sores/fever blisters.).          Signed: Verlee Rossetti 06/11/2022, 8:02 AM  [facility] is now [employer] [address], [place], [place] [address] Phone: [phone] Facebook  Instagram   [employer]

## 2022-06-11 NOTE — TOC Transition Note (Signed)
Transition of Care [facility]) - CM/SW Discharge Note  Patient Details  Name: Elizabeth Cohen MRN: [id number] Date of Birth: 01/10/1961  Transition of Care [facility]) CM/SW Contact:  Ewing Schlein, LCSW Phone Number: 06/11/2022, 11:25 AM  Clinical Narrative: Patient is expected to discharge home after working with PT. CSW met with patient to review discharge plan and needs. Patient will go home with OPPT at [facility]. Patient will need a rolling walker, which [employer] will deliver to patient's room. TOC signing off.  Final next level of care: OP Rehab Barriers to Discharge: No Barriers Identified  Patient Goals and CMS Choice CMS Medicare.gov Compare Post Acute Care list provided to:: Patient Choice offered to / list presented to : Patient  Discharge Plan and Services Additional resources added to the After Visit Summary for      DME Arranged: Walker rolling DME Agency: [employer] Date DME Agency Contacted: 06/11/22 Representative spoke with at DME Agency: Vaughan Basta  Social Determinants of Health (SDOH) Interventions SDOH Screenings   Food Insecurity: No Food Insecurity (06/10/2022)  Housing: Low Risk  (06/10/2022)  Transportation Needs: No Transportation Needs (06/10/2022)  Utilities: Not At Risk (06/10/2022)  Tobacco Use: Medium Risk (06/10/2022)   Readmission Risk Interventions     No data to display

## 2022-06-11 NOTE — Evaluation (Signed)
Physical Therapy Evaluation Patient Details Name: Elizabeth Cohen MRN: [id number] DOB: [birth year] Today's Date: 06/11/2022  History of Present Illness  62 yo female s/p R TKA on 06/10/22. PMH: PLIF, HTN, fibromyalgia  Clinical Impression  Pt admitted with above diagnosis.  Anticipate steady progress, pt is motivated to d/c today; will see for a second session in pm   Pt currently with functional limitations due to the deficits listed below (see PT Problem List). Pt will benefit from acute skilled PT to increase their independence and safety with mobility to allow discharge.          Recommendations for follow up therapy are one component of a multi-disciplinary discharge planning process, led by the attending physician.  Recommendations may be updated based on patient status, additional functional criteria and insurance authorization.  Follow Up Recommendations       Assistance Recommended at Discharge Intermittent Supervision/Assistance  Patient can return home with the following  A little help with bathing/dressing/bathroom;Assist for transportation;Help with stairs or ramp for entrance;Assistance with cooking/housework    Equipment Recommendations Rolling walker (2 wheels)  Recommendations for Other Services       Functional Status Assessment Patient has had a recent decline in their functional status and demonstrates the ability to make significant improvements in function in a reasonable and predictable amount of time.     Precautions / Restrictions Precautions Precautions: Fall;Knee Restrictions Weight Bearing Restrictions: No Other Position/Activity Restrictions: WBAT      Mobility  Bed Mobility Overal bed mobility: Needs Assistance Bed Mobility: Supine to Sit     Supine to sit: Supervision, Min guard     General bed mobility comments: incr time, min/guard for safety    Transfers Overall transfer level: Needs assistance Equipment used: Rolling walker (2  wheels) Transfers: Sit to/from Stand Sit to Stand: Min guard           General transfer comment: cues for hand placement    Ambulation/Gait Ambulation/Gait assistance: Supervision, Min guard Gait Distance (Feet): 120 Feet Assistive device: Rolling walker (2 wheels) Gait Pattern/deviations: Step-to pattern, Step-through pattern       General Gait Details: cues for sequence and progression to step through pattern. good stability, no LOB, no knee buckling  Stairs Stairs: Yes Stairs assistance: Min guard Stair Management: Two rails, Step to pattern Number of Stairs: 3 General stair comments: cues for sequence and technique. husband present. no LOB or knee buckling noted  Wheelchair Mobility    Modified Rankin (Stroke Patients Only)       Balance Overall balance assessment: Needs assistance Sitting-balance support: No upper extremity supported, Feet supported Sitting balance-Leahy Scale: Good     Standing balance support: During functional activity, Reliant on assistive device for balance Standing balance-Leahy Scale: Fair                               Pertinent Vitals/Pain Pain Assessment Pain Assessment: 0-10 Pain Score: 6  Pain Location: L knee Pain Descriptors / Indicators: Aching Pain Intervention(s): Limited activity within patient's tolerance, Monitored during session, Repositioned, Premedicated before session    Home Living Family/patient expects to be discharged to:: Private residence Living Arrangements: Spouse/significant other Available Help at Discharge: Family;Available 24 hours/day Type of Home: Mobile home Home Access: Stairs to enter Entrance Stairs-Rails: Right;Left;Can reach both Entrance Stairs-Number of Steps: 5 steps +1   Home Layout: One level Home Equipment: BSC/3in1;Shower seat      Prior  Function Prior Level of Function : Independent/Modified Independent                     Hand Dominance         Extremity/Trunk Assessment   Upper Extremity Assessment Upper Extremity Assessment: Overall WFL for tasks assessed    Lower Extremity Assessment Lower Extremity Assessment: LLE deficits/detail LLE Deficits / Details: ankle WFL, knee and hip grossly 3/5; knee flexion ~ 70 degrees       Communication   Communication: No difficulties  Cognition Arousal/Alertness: Awake/alert Behavior During Therapy: WFL for tasks assessed/performed Overall Cognitive Status: Within Functional Limits for tasks assessed                                          General Comments      Exercises Total Joint Exercises Ankle Circles/Pumps: AROM, Both, 10 reps   Assessment/Plan    PT Assessment Patient needs continued PT services  PT Problem List Decreased strength;Decreased activity tolerance;Decreased mobility;Decreased balance;Pain;Decreased range of motion;Decreased knowledge of use of DME;Decreased knowledge of precautions       PT Treatment Interventions DME instruction;Therapeutic exercise;Gait training;Stair training;Functional mobility training;Therapeutic activities;Patient/family education    PT Goals (Current goals can be found in the Care Plan section)  Acute Rehab PT Goals PT Goal Formulation: With patient Time For Goal Achievement: 06/17/22 Potential to Achieve Goals: Good    Frequency 7X/week     Co-evaluation               AM-PAC PT "6 Clicks" Mobility  Outcome Measure Help needed turning from your back to your side while in a flat bed without using bedrails?: A Little Help needed moving from lying on your back to sitting on the side of a flat bed without using bedrails?: A Little Help needed moving to and from a bed to a chair (including a wheelchair)?: A Little Help needed standing up from a chair using your arms (e.g., wheelchair or bedside chair)?: A Little Help needed to walk in hospital room?: A Little Help needed climbing 3-5 steps with a  railing? : A Little 6 Click Score: 18    End of Session Equipment Utilized During Treatment: Gait belt Activity Tolerance: Patient tolerated treatment well Patient left: with call bell/phone within reach;in chair;with chair alarm set;with family/visitor present   PT Visit Diagnosis: Other abnormalities of gait and mobility (R26.89);Difficulty in walking, not elsewhere classified (R26.2)    Time: 1135-1202 PT Time Calculation (min) (ACUTE ONLY): 27 min   Charges:   PT Evaluation $PT Eval Low Complexity: 1 Low PT Treatments $Gait Training: 8-22 mins        Delice Bison, PT  Acute Rehab Dept [facility]) [phone]  06/11/2022   [facility] 06/11/2022, 12:59 PM

## 2022-06-11 NOTE — Plan of Care (Signed)
  Problem: Education: Goal: Knowledge of the prescribed therapeutic regimen will improve Outcome: Progressing   Problem: Activity: Goal: Range of joint motion will improve Outcome: Progressing   Problem: Pain Management: Goal: Pain level will decrease with appropriate interventions Outcome: Progressing   Problem: Safety: Goal: Ability to remain free from injury will improve Outcome: Progressing   

## 2022-06-11 NOTE — Op Note (Signed)
NAME: Elizabeth Cohen, Elizabeth Cohen MEDICAL RECORD NO: [id number] ACCOUNT NO: [id number] DATE OF BIRTH: [birth year] FACILITY: [facility]ATION: [id number] PHYSICIAN: Almedia Balls. Ranell Patrick, MD  Operative Report   DATE OF PROCEDURE: 06/10/2022  PREOPERATIVE DIAGNOSIS:  Right knee end-stage arthritis.  POSTOPERATIVE DIAGNOSIS:  Right knee end-stage arthritis.  PROCEDURE PERFORMED:  Right total knee arthroplasty using DePuy Attune prosthesis.  ATTENDING SURGEON:  Almedia Balls. Ranell Patrick, MD  ASSISTANT:  Konrad Felix Dixon, [place], who was scrubbed during the entire procedure.  ANESTHESIA:  Spinal anesthesia plus adductor canal block was utilized.  ESTIMATED BLOOD LOSS:  Minimal.  FLUID REPLACEMENT:  1500 mL crystalloid.  COUNTS:  Instrument counts correct.  COMPLICATIONS:  No complications.  ANTIBIOTICS:  Perioperative antibiotics were given.  INDICATIONS:  The patient is a 62 year old female who presents with worsening right knee pain secondary to end-stage arthritis.  The patient has had a thorough conservative treatment prescribed including injections, modification of activities, therapy,  presents for ongoing pain, limiting function and quality of life.  Informed consent obtained for total knee arthroplasty to eliminate pain and restore function.  The patient has bone-on-bone and full thickness cartilage loss on the MRI.  The patient has  had a failure of conservative management.  After discussing all options, the patient elected to proceed with total knee arthroplasty in order to eliminate pain and restore function.  Informed consent obtained.  DESCRIPTION OF PROCEDURE:  After an adequate level of spinal anesthesia was achieved plus adductor canal block placed, the patient positioned supine on the operating table.  Nonsterile tourniquet placed on right proximal thigh.  Right leg sterilely  prepped and draped in the usual manner.  Timeout called, verifying correct patient, correct site. We elevated the leg and  exsanguinated with an Esmarch bandage, inflating the tourniquet to 300 mmHg, placed the knee in flexion, performed a longitudinal  midline incision with a 10 blade scalpel.  Dissection down through subcutaneous tissues.  We used a fresh 10 blade for the medial parapatellar arthrotomy.  We divided lateral patellofemoral ligaments, everting the patella and exposing the distal femur  that had full thickness cartilage loss on the medial condyle.  We entered the distal femur with a step cut drill.  We then placed our intramedullary guide and resected the femur 9 mm off the distal femur set on 5 degrees of valgus.  We then sized our  femur to a size 5 anterior down, performing anterior, posterior and chamfer cuts.  Next, we went ahead and removed ACL and PCL meniscal tissues and subluxed the tibia anteriorly.  We then performed our tibial cut 90 degrees perpendicular to the long axis  of the tibia using the external jig set on the minimal posterior slope for this posterior cruciate substituting prosthesis.  We removed 2 mm off the affected medial side.  We had a nice tibial cut.  We then placed our lamina spreader and removed excess  posterior femoral condyle osteophytes and released the posterior capsule, injecting the posterior capsule with Marcaine, Exparel and saline.  We then went ahead and checked our gaps, which were symmetric at 5 mm.  We then completed our tibial preparation  for the 5 tibia.  We then did a 5 right narrow femur and did our box cut for that with the oscillating saw.  Once we had our trial components in place with a trial 5 mm poly, we had excellent stability.  We then went to the patella and resurfaced going  from a 22 mm thickness down  to a 15 mm thickness.  We cut using the oscillating saw and the cutting jig.  We then drilled our lug holes for the 35 patellar button.  We trialed the 35 button in place and had excellent patellar tracking with no-touch  technique.  We removed all trial  components, irrigated thoroughly.  We then vacuum mixed high viscosity cement on the back table and cemented the components into place all in one step including a 5 tibia, 5 right narrow femur.  We used the 5 trial poly  in place and placed the knee in extension for good compression of the components while the cement set up and we used a patellar clamp for the patella.  Once all cement was hardened on table, we removed excess cement with quarter-inch curved osteotome.   We did a thorough inspection of the knee and injected the anterior capsule with Marcaine, Exparel and saline.  We then went ahead and selected the real size 5, 6 mm poly and placed that on the tibial tray and reduced the knee, had a nice little pop as  that medial condyle reduced, excellent stability and full range of motion with good extension.  We irrigated thoroughly and then repaired the parapatellar arthrotomy with #1 Vicryl suture, followed by 0 and 2-0 layered subcutaneous closure and 4-0  Monocryl for skin.  Steri-Strips applied followed by sterile dressing.  The patient tolerated surgery well.   SHW D: 06/10/2022 3:13:11 pm T: 06/11/2022 12:16:00 am  JOB: [id number]/ [id number]

## 2022-06-11 NOTE — Progress Notes (Signed)
Orthopedics Progress Note  Subjective: Patient with some posterior pain but tolerating well  Objective:  Vitals:   06/11/22 0055 06/11/22 0443  BP: 118/69 127/72  Pulse: 69 67  Resp: 18 18  Temp: 98.1 F (36.7 C) 98.2 F (36.8 C)  SpO2: 94% 93%    General: Awake and alert  Musculoskeletal: Right knee incision CDI, dressing changed. Mild swelling in the leg Neg Homans Neurovascularly intact  Lab Results  Component Value Date   WBC 14.5 (H) 06/11/2022   HGB 11.4 (L) 06/11/2022   HCT 35.5 (L) 06/11/2022   MCV 92.7 06/11/2022   PLT 295 06/11/2022       Component Value Date/Time   NA 140 06/11/2022 0349   K 3.8 06/11/2022 0349   CL 107 06/11/2022 0349   CO2 22 06/11/2022 0349   GLUCOSE 133 (H) 06/11/2022 0349   BUN 16 06/11/2022 0349   CREATININE 0.94 06/11/2022 0349   CALCIUM 8.6 (L) 06/11/2022 0349   GFRNONAA >60 06/11/2022 0349   GFRAA 55 (L) 08/14/2019 2326    No results found for: "INR", "PROTIME"  Assessment/Plan: POD #1 s/p Procedure(s): TOTAL KNEE ARTHROPLASTY Stable this AM. Up with therapy. Home once she clears PT goals F/U in two weeks in the office  Viviann Spare R. Ranell Patrick, MD 06/11/2022 8:00 AM

## 2022-06-13 ENCOUNTER — Ambulatory Visit: Payer: BC Managed Care – PPO | Attending: Orthopedic Surgery

## 2022-06-13 ENCOUNTER — Encounter (HOSPITAL_COMMUNITY): Payer: Self-pay | Admitting: Orthopedic Surgery

## 2022-06-13 ENCOUNTER — Other Ambulatory Visit: Payer: Self-pay

## 2022-06-13 DIAGNOSIS — M25561 Pain in right knee: Secondary | ICD-10-CM | POA: Diagnosis present

## 2022-06-13 DIAGNOSIS — R600 Localized edema: Secondary | ICD-10-CM | POA: Diagnosis present

## 2022-06-13 DIAGNOSIS — M25661 Stiffness of right knee, not elsewhere classified: Secondary | ICD-10-CM | POA: Diagnosis present

## 2022-06-13 NOTE — Anesthesia Postprocedure Evaluation (Signed)
Anesthesia Post Note  Patient: Elizabeth Cohen  Procedure(s) Performed: TOTAL KNEE ARTHROPLASTY (Right: Knee)     Patient location during evaluation: Nursing Unit Anesthesia Type: Regional and Spinal Level of consciousness: oriented and awake and alert Pain management: pain level controlled Vital Signs Assessment: post-procedure vital signs reviewed and stable Respiratory status: spontaneous breathing and respiratory function stable Cardiovascular status: blood pressure returned to baseline and stable Postop Assessment: no headache, no backache, no apparent nausea or vomiting and patient able to bend at knees Anesthetic complications: no  No notable events documented.  Last Vitals:  Vitals:   06/11/22 0945 06/11/22 1343  BP: (!) 87/45 104/61  Pulse:  (!) 58  Resp: 17 18  Temp: 36.6 C 36.8 C  SpO2: 92% 93%    Last Pain:  Vitals:   06/11/22 1317  TempSrc:   PainSc: 5                  Trevor Iha

## 2022-06-13 NOTE — Therapy (Signed)
OUTPATIENT PHYSICAL THERAPY LOWER EXTREMITY EVALUATION   Patient Name: Elizabeth Cohen MRN: [id number] DOB:12-07-1960, 62 y.o., female Today's Date: 06/13/2022  END OF SESSION:  PT End of Session - 06/13/22 1117     Visit Number 1    Number of Visits 8    Date for PT Re-Evaluation 07/15/22    PT Start Time 1127    PT Stop Time 1210    PT Time Calculation (min) 43 min    Activity Tolerance Patient limited by pain    Behavior During Therapy [facility] for tasks assessed/performed             Past Medical History:  Diagnosis Date   Acid reflux    Anxiety    OCD   Arthritis    Bell's palsy 03/2019   Cancer (HCC)    Skin right arm and left ear, left breast   Complication of anesthesia    slow to wake up during wisdom teeth ext, no other problems with the other surgery   Depression    Fibromyalgia    History of skin cancer    Basal cell, squamous cell   right arm and left ear, left breast    Hypertension    Migraine    Palpitations    PONV (postoperative nausea and vomiting)    Positive ANA (antinuclear antibody)    no problems, no meds   Past Surgical History:  Procedure Laterality Date   ABDOMINAL HYSTERECTOMY     APPENDECTOMY  2002   BACK SURGERY  07/25/2019   Laminectomy and poraminotomy lumbar @ [facility]   CARPAL TUNNEL RELEASE  2015   CHOLECYSTECTOMY  2004   COLONOSCOPY  10/04/2010   Procedure: COLONOSCOPY;  Surgeon: Corbin Ade, MD;  [facility];  Service: Endoscopy;  Laterality: N/A;  8:15AM   COLONOSCOPY WITH PROPOFOL N/A 12/26/2019   Procedure: COLONOSCOPY WITH PROPOFOL;  Surgeon: Corbin Ade, MD;  [facility];  Service: Endoscopy;  Laterality: N/A;  12:30pm   ESOPHAGOGASTRODUODENOSCOPY (EGD) WITH PROPOFOL N/A 12/26/2019   Procedure: ESOPHAGOGASTRODUODENOSCOPY (EGD) WITH PROPOFOL;  Surgeon: Corbin Ade, MD;  [facility];  Service: Endoscopy;  Laterality: N/A;   EXTERNAL EAR SURGERY Left  01/2018   Skin cancer   FOOT SURGERY Bilateral 2018   KNEE ARTHROSCOPY W/ DEBRIDEMENT Left    POLYPECTOMY  12/26/2019   Procedure: POLYPECTOMY;  Surgeon: Corbin Ade, MD;  [facility];  Service: Endoscopy;;   SKIN CANCER EXCISION Right 09/20/2018   Right arm   TENDON REPAIR     TOTAL KNEE ARTHROPLASTY Right 06/10/2022   Procedure: TOTAL KNEE ARTHROPLASTY;  Surgeon: Beverely Low, MD;  Location: [facility];  Service: Orthopedics;  Laterality: Right;   ULNAR NERVE REPAIR     UPPER GI ENDOSCOPY     WISDOM TOOTH EXTRACTION     Patient Active Problem List   Diagnosis Date Noted   Status post total knee replacement, right 06/10/2022   Lumbar stenosis with neurogenic claudication 08/14/2019   S/P lumbar fusion 08/14/2019   Left-sided Bell's palsy 04/01/2019   Numbness of tongue 04/01/2019   GERD 01/19/2010   CONSTIPATION, CHRONIC 01/19/2010   ABDOMINAL PAIN 01/19/2010    PCP: Richardean Chimera, MD  REFERRING PROVIDER: Beverely Low, MD   REFERRING DIAG: Unilateral primary osteoarthritis, right knee   THERAPY DIAG:  Acute pain of right knee  Stiffness of right knee, not elsewhere classified  Localized edema  Rationale for Evaluation and Treatment: Rehabilitation  ONSET DATE: 06/10/22  SUBJECTIVE:   SUBJECTIVE STATEMENT: Patient reports that she had a right knee replacement on 06/10/22. She notes that it has been hurting a lot since she woke up from surgery. She is having a hard time keeping her leg straight due to her low back pain. She almost fell yesterday, but she was able to catch herself. She has been doing her HEP about every other hour.   PERTINENT HISTORY: Allergies, chronic low back pain, chronic cervical pain, anxiety, arthritis, history of cancer, depression, fibromyalgia, and hypertension PAIN:  Are you having pain? Yes: NPRS scale: 10/10 Pain location: right knee Pain description: constant, throbbing, stabbing, and burning with some  spasms Aggravating factors: bending her knee Relieving factors: ice  PRECAUTIONS: Fall  WEIGHT BEARING RESTRICTIONS: No  FALLS:  Has patient fallen in last 6 months? Yes. Number of falls 1; out in her yard  LIVING ENVIRONMENT: Lives with: lives with their spouse Lives in: House/apartment Stairs: Yes: External: 5 steps; can reach both; step to pattern "up with the good and down with the bad"  Has following equipment at home: Walker - 2 wheeled  OCCUPATION: disabled  PLOF: Independent  PATIENT GOALS: improved mobility and return to yard work   NEXT MD VISIT: unsure  OBJECTIVE:   PATIENT SURVEYS:  FOTO 16.35  COGNITION: Overall cognitive status: Within functional limits for tasks assessed     SENSATION: Patient reports no numbness or tingling  EDEMA:  Circumferential: Right knee joint line: 50 cm Left knee joint line: 43 cm   PALPATION: TTP: right quadriceps, IT band, TFL, hamstrings, hip adductors, and proximal gastrocnemius  LOWER EXTREMITY ROM:  Active ROM Right eval Left eval  Hip flexion    Hip extension    Hip abduction    Hip adduction    Hip internal rotation    Hip external rotation    Knee flexion 64/ 66 (PROM)  129  Knee extension 12 0  Ankle dorsiflexion    Ankle plantarflexion    Ankle inversion    Ankle eversion     (Blank rows = not tested)  LOWER EXTREMITY MMT: not tested due to surgical condition  FUNCTIONAL TESTS:  Required moderate assistance with bed mobility, sit to supine, and supine to sit transfers  GAIT: Assistive device utilized: Environmental consultant - 2 wheeled Level of assistance: Modified independence Comments: Step to pattern with decreased gait speed and stride length secondary to right knee pain   TODAY'S TREATMENT:                                                                                                                              DATE:   Modalities: no redness or adverse reaction to today's modalities  Date:  Vaso:  Knee, 34 degrees; low pressure, 9 mins, Pain and Edema  PATIENT EDUCATION:  Education details: HEP, plan of care, prognosis, healing, and goals for therapy Person  educated: Patient and Spouse Education method: Explanation Education comprehension: verbalized understanding  HOME EXERCISE PROGRAM: The HEP provided to her in the hospital was reviewed and she was able to properly recall these interventions.  ASSESSMENT:  CLINICAL IMPRESSION: Patient is a 62 y.o. female who was seen today for physical therapy evaluation and treatment following a right total knee arthroplasty on 06/10/22.  She presented with high pain severity and irritability with right knee motion being the most aggravating to her familiar symptoms.  She exhibited increased right knee edema, but no signs or symptoms of a DVT or other complication.  Recommend that she continue with skilled physical therapy to address her impairments to return to her prior level of function.  OBJECTIVE IMPAIRMENTS: Abnormal gait, decreased activity tolerance, decreased mobility, difficulty walking, decreased ROM, decreased strength, hypomobility, increased edema, impaired tone, and pain.   ACTIVITY LIMITATIONS: carrying, lifting, standing, squatting, sleeping, stairs, transfers, bed mobility, bathing, toileting, dressing, and locomotion level  PARTICIPATION LIMITATIONS: meal prep, cleaning, laundry, driving, shopping, community activity, and yard work  PERSONAL FACTORS: 3+ comorbidities: Allergies, chronic low back pain, chronic cervical pain, anxiety, arthritis, history of cancer, depression, fibromyalgia, and hypertension  are also affecting patient's functional outcome.   REHAB POTENTIAL: Good  CLINICAL DECISION MAKING: Evolving/moderate complexity  EVALUATION COMPLEXITY: Moderate   GOALS: Goals reviewed with patient? Yes  LONG TERM GOALS: Target date: 07/11/22  Patient will be independent with her HEP. Baseline:  Goal status:  INITIAL  2.  Patient will be able to demonstrate active right knee extension within 5 degrees of neutral for improved gait mechanics. Baseline:  Goal status: INITIAL  3.  Patient will be able to demonstrate at least 120 degrees of right knee flexion for improved function navigating stairs. Baseline:  Goal status: INITIAL  4.  Patient will be able to safely ambulate at least 80 feet without an assistive device for improved household mobility. Baseline:  Goal status: INITIAL   PLAN:  PT FREQUENCY: 2x/week  PT DURATION: 4 weeks  PLANNED INTERVENTIONS: Therapeutic exercises, Therapeutic activity, Neuromuscular re-education, Balance training, Gait training, Patient/Family education, Self Care, Joint mobilization, Stair training, Electrical stimulation, Cryotherapy, Moist heat, Vasopneumatic device, Manual therapy, and Re-evaluation  PLAN FOR NEXT SESSION: NuStep, quad sets, heel slides, passive range of motion, and modalities as needed   Granville Lewis, PT 06/13/2022, 4:18 PM

## 2022-06-15 ENCOUNTER — Encounter: Payer: Self-pay | Admitting: Physical Therapy

## 2022-06-15 ENCOUNTER — Ambulatory Visit: Payer: BC Managed Care – PPO | Attending: Orthopedic Surgery | Admitting: Physical Therapy

## 2022-06-15 DIAGNOSIS — M25661 Stiffness of right knee, not elsewhere classified: Secondary | ICD-10-CM | POA: Diagnosis present

## 2022-06-15 DIAGNOSIS — M25561 Pain in right knee: Secondary | ICD-10-CM | POA: Diagnosis present

## 2022-06-15 DIAGNOSIS — R600 Localized edema: Secondary | ICD-10-CM | POA: Diagnosis present

## 2022-06-15 NOTE — Therapy (Signed)
OUTPATIENT PHYSICAL THERAPY LOWER EXTREMITY EVALUATION   Patient Name: Elizabeth Cohen MRN: [id number] DOB:[birth year], 62 y.o., female Today's Date: 06/15/2022  END OF SESSION:  PT End of Session - 06/15/22 1154     Visit Number 2    Number of Visits 8    Date for PT Re-Evaluation 07/15/22    PT Start Time 1113    PT Stop Time 1206    PT Time Calculation (min) 53 min    Activity Tolerance Patient tolerated treatment well    Behavior During Therapy [facility] for tasks assessed/performed             Past Medical History:  Diagnosis Date   Acid reflux    Anxiety    OCD   Arthritis    Bell's palsy 03/2019   Cancer (HCC)    Skin right arm and left ear, left breast   Complication of anesthesia    slow to wake up during wisdom teeth ext, no other problems with the other surgery   Depression    Fibromyalgia    History of skin cancer    Basal cell, squamous cell   right arm and left ear, left breast    Hypertension    Migraine    Palpitations    PONV (postoperative nausea and vomiting)    Positive ANA (antinuclear antibody)    no problems, no meds   Past Surgical History:  Procedure Laterality Date   ABDOMINAL HYSTERECTOMY     APPENDECTOMY  2002   BACK SURGERY  07/25/2019   Laminectomy and poraminotomy lumbar @ [facility]   CARPAL TUNNEL RELEASE  2015   CHOLECYSTECTOMY  2004   COLONOSCOPY  10/04/2010   Procedure: COLONOSCOPY;  Surgeon: Corbin Ade, MD;  [facility];  Service: Endoscopy;  Laterality: N/A;  8:15AM   COLONOSCOPY WITH PROPOFOL N/A 12/26/2019   Procedure: COLONOSCOPY WITH PROPOFOL;  Surgeon: Corbin Ade, MD;  [facility];  Service: Endoscopy;  Laterality: N/A;  12:30pm   ESOPHAGOGASTRODUODENOSCOPY (EGD) WITH PROPOFOL N/A 12/26/2019   Procedure: ESOPHAGOGASTRODUODENOSCOPY (EGD) WITH PROPOFOL;  Surgeon: Corbin Ade, MD;  [facility];  Service: Endoscopy;  Laterality: N/A;   EXTERNAL EAR SURGERY  Left 01/2018   Skin cancer   FOOT SURGERY Bilateral 2018   KNEE ARTHROSCOPY W/ DEBRIDEMENT Left    POLYPECTOMY  12/26/2019   Procedure: POLYPECTOMY;  Surgeon: Corbin Ade, MD;  [facility];  Service: Endoscopy;;   SKIN CANCER EXCISION Right 09/20/2018   Right arm   TENDON REPAIR     TOTAL KNEE ARTHROPLASTY Right 06/10/2022   Procedure: TOTAL KNEE ARTHROPLASTY;  Surgeon: Beverely Low, MD;  Location: [facility];  Service: Orthopedics;  Laterality: Right;   ULNAR NERVE REPAIR     UPPER GI ENDOSCOPY     WISDOM TOOTH EXTRACTION     Patient Active Problem List   Diagnosis Date Noted   Status post total knee replacement, right 06/10/2022   Lumbar stenosis with neurogenic claudication 08/14/2019   S/P lumbar fusion 08/14/2019   Left-sided Bell's palsy 04/01/2019   Numbness of tongue 04/01/2019   GERD 01/19/2010   CONSTIPATION, CHRONIC 01/19/2010   ABDOMINAL PAIN 01/19/2010    PCP: Richardean Chimera, MD  REFERRING PROVIDER: Beverely Low, MD   REFERRING DIAG: Unilateral primary osteoarthritis, right knee   THERAPY DIAG:  Acute pain of right knee  Stiffness of right knee, not elsewhere classified  Localized edema  Rationale for Evaluation and Treatment: Rehabilitation  ONSET DATE: 06/10/22  SUBJECTIVE:   SUBJECTIVE STATEMENT: No better today than first day. PERTINENT HISTORY: Allergies, chronic low back pain, chronic cervical pain, anxiety, arthritis, history of cancer, depression, fibromyalgia, and hypertension PAIN:  Are you having pain? Yes: NPRS scale: 4-5/10 Pain location: right knee Pain description: constant, throbbing, stabbing, and burning with some spasms Aggravating factors: bending her knee Relieving factors: ice  PRECAUTIONS: Fall  WEIGHT BEARING RESTRICTIONS: No  FALLS:  Has patient fallen in last 6 months? Yes. Number of falls 1; out in her yard  LIVING ENVIRONMENT: Lives with: lives with their spouse Lives in: House/apartment Stairs:  Yes: External: 5 steps; can reach both; step to pattern "up with the good and down with the bad"  Has following equipment at home: Walker - 2 wheeled  OCCUPATION: disabled  PLOF: Independent  PATIENT GOALS: improved mobility and return to yard work   NEXT MD VISIT: unsure  OBJECTIVE:   PATIENT SURVEYS:  FOTO 16.35  COGNITION: Overall cognitive status: Within functional limits for tasks assessed     SENSATION: Patient reports no numbness or tingling  EDEMA:  Circumferential: Right knee joint line: 50 cm Left knee joint line: 43 cm   PALPATION: TTP: right quadriceps, IT band, TFL, hamstrings, hip adductors, and proximal gastrocnemius  LOWER EXTREMITY ROM:  Active ROM Right eval Left eval  Hip flexion    Hip extension    Hip abduction    Hip adduction    Hip internal rotation    Hip external rotation    Knee flexion 64/ 66 (PROM)  129  Knee extension 12 0  Ankle dorsiflexion    Ankle plantarflexion    Ankle inversion    Ankle eversion     (Blank rows = not tested)  LOWER EXTREMITY MMT: not tested due to surgical condition  FUNCTIONAL TESTS:  Required moderate assistance with bed mobility, sit to supine, and supine to sit transfers  GAIT: Assistive device utilized: Environmental consultant - 2 wheeled Level of assistance: Modified independence Comments: Step to pattern with decreased gait speed and stride length secondary to right knee pain   TODAY'S TREATMENT:                                                                                                                              DATE:   Modalities: no redness or adverse reaction to today's modalities  Date:  Vaso: Knee, 34 degrees; low pressure, 9 mins, Pain and Edema  PATIENT EDUCATION:  Education details: HEP, plan of care, prognosis, healing, and goals for therapy Person educated: Patient and Spouse Education method: Explanation Education comprehension: verbalized understanding  HOME EXERCISE PROGRAM: The HEP  provided to her in the hospital was reviewed and she was able to properly recall these interventions.  TODAY's TX:  06/15/22:  Nustep on level 1 moving seat forward x 2 to increase flexion x 15 minutes f/b gentle PROM x  12 minutes to patient's left knee in flexion and extension with low load long duration stretching utilized.  LE elevation and vasopeumatic on low x 15 minutes.  ASSESSMENT:  CLINICAL IMPRESSION: Patient did a great job today progressing to seat 9 on Nustep with very good tolerance of gentle low load long duration stretching.  OBJECTIVE IMPAIRMENTS: Abnormal gait, decreased activity tolerance, decreased mobility, difficulty walking, decreased ROM, decreased strength, hypomobility, increased edema, impaired tone, and pain.   ACTIVITY LIMITATIONS: carrying, lifting, standing, squatting, sleeping, stairs, transfers, bed mobility, bathing, toileting, dressing, and locomotion level  PARTICIPATION LIMITATIONS: meal prep, cleaning, laundry, driving, shopping, community activity, and yard work  PERSONAL FACTORS: 3+ comorbidities: Allergies, chronic low back pain, chronic cervical pain, anxiety, arthritis, history of cancer, depression, fibromyalgia, and hypertension  are also affecting patient's functional outcome.   REHAB POTENTIAL: Good  CLINICAL DECISION MAKING: Evolving/moderate complexity  EVALUATION COMPLEXITY: Moderate   GOALS: Goals reviewed with patient? Yes  LONG TERM GOALS: Target date: 07/11/22  Patient will be independent with her HEP. Baseline:  Goal status: INITIAL  2.  Patient will be able to demonstrate active right knee extension within 5 degrees of neutral for improved gait mechanics. Baseline:  Goal status: INITIAL  3.  Patient will be able to demonstrate at least 120 degrees of right knee flexion for improved function navigating stairs. Baseline:  Goal status: INITIAL  4.  Patient will be able to safely ambulate at least 80 feet without an assistive  device for improved household mobility. Baseline:  Goal status: INITIAL   PLAN:  PT FREQUENCY: 2x/week  PT DURATION: 4 weeks  PLANNED INTERVENTIONS: Therapeutic exercises, Therapeutic activity, Neuromuscular re-education, Balance training, Gait training, Patient/Family education, Self Care, Joint mobilization, Stair training, Electrical stimulation, Cryotherapy, Moist heat, Vasopneumatic device, Manual therapy, and Re-evaluation  PLAN FOR NEXT SESSION: NuStep, quad sets, heel slides, passive range of motion, and modalities as needed   Terrace Fontanilla, [place], PT 06/15/2022, 12:09 PM

## 2022-06-20 ENCOUNTER — Ambulatory Visit: Payer: BC Managed Care – PPO

## 2022-06-20 DIAGNOSIS — M25561 Pain in right knee: Secondary | ICD-10-CM | POA: Diagnosis not present

## 2022-06-20 DIAGNOSIS — R600 Localized edema: Secondary | ICD-10-CM

## 2022-06-20 DIAGNOSIS — M25661 Stiffness of right knee, not elsewhere classified: Secondary | ICD-10-CM

## 2022-06-20 NOTE — Therapy (Signed)
OUTPATIENT PHYSICAL THERAPY LOWER EXTREMITY EVALUATION   Patient Name: Elizabeth Cohen MRN: [id number] DOB:[birth year], 62 y.o., female Today's Date: 06/20/2022  END OF SESSION:  PT End of Session - 06/20/22 1119     Visit Number 3    Number of Visits 8    Date for PT Re-Evaluation 07/15/22    PT Start Time 1115    Activity Tolerance Patient tolerated treatment well    Behavior During Therapy [facility] for tasks assessed/performed             Past Medical History:  Diagnosis Date   Acid reflux    Anxiety    OCD   Arthritis    Bell's palsy 03/2019   Cancer (HCC)    Skin right arm and left ear, left breast   Complication of anesthesia    slow to wake up during wisdom teeth ext, no other problems with the other surgery   Depression    Fibromyalgia    History of skin cancer    Basal cell, squamous cell   right arm and left ear, left breast    Hypertension    Migraine    Palpitations    PONV (postoperative nausea and vomiting)    Positive ANA (antinuclear antibody)    no problems, no meds   Past Surgical History:  Procedure Laterality Date   ABDOMINAL HYSTERECTOMY     APPENDECTOMY  2002   BACK SURGERY  07/25/2019   Laminectomy and poraminotomy lumbar @ [facility]   CARPAL TUNNEL RELEASE  2015   CHOLECYSTECTOMY  2004   COLONOSCOPY  10/04/2010   Procedure: COLONOSCOPY;  Surgeon: Corbin Ade, MD;  [facility];  Service: Endoscopy;  Laterality: N/A;  8:15AM   COLONOSCOPY WITH PROPOFOL N/A 12/26/2019   Procedure: COLONOSCOPY WITH PROPOFOL;  Surgeon: Corbin Ade, MD;  [facility];  Service: Endoscopy;  Laterality: N/A;  12:30pm   ESOPHAGOGASTRODUODENOSCOPY (EGD) WITH PROPOFOL N/A 12/26/2019   Procedure: ESOPHAGOGASTRODUODENOSCOPY (EGD) WITH PROPOFOL;  Surgeon: Corbin Ade, MD;  [facility];  Service: Endoscopy;  Laterality: N/A;   EXTERNAL EAR SURGERY Left 01/2018   Skin cancer   FOOT SURGERY Bilateral 2018    KNEE ARTHROSCOPY W/ DEBRIDEMENT Left    POLYPECTOMY  12/26/2019   Procedure: POLYPECTOMY;  Surgeon: Corbin Ade, MD;  [facility];  Service: Endoscopy;;   SKIN CANCER EXCISION Right 09/20/2018   Right arm   TENDON REPAIR     TOTAL KNEE ARTHROPLASTY Right 06/10/2022   Procedure: TOTAL KNEE ARTHROPLASTY;  Surgeon: Beverely Low, MD;  Location: [facility];  Service: Orthopedics;  Laterality: Right;   ULNAR NERVE REPAIR     UPPER GI ENDOSCOPY     WISDOM TOOTH EXTRACTION     Patient Active Problem List   Diagnosis Date Noted   Status post total knee replacement, right 06/10/2022   Lumbar stenosis with neurogenic claudication 08/14/2019   S/P lumbar fusion 08/14/2019   Left-sided Bell's palsy 04/01/2019   Numbness of tongue 04/01/2019   GERD 01/19/2010   CONSTIPATION, CHRONIC 01/19/2010   ABDOMINAL PAIN 01/19/2010    PCP: Richardean Chimera, MD  REFERRING PROVIDER: Beverely Low, MD   REFERRING DIAG: Unilateral primary osteoarthritis, right knee   THERAPY DIAG:  Acute pain of right knee  Stiffness of right knee, not elsewhere classified  Localized edema  Rationale for Evaluation and Treatment: Rehabilitation  ONSET DATE: 06/10/22  SUBJECTIVE:   SUBJECTIVE  STATEMENT: Pt reports achy 2-3/10 right knee pain today.  PERTINENT HISTORY: Allergies, chronic low back pain, chronic cervical pain, anxiety, arthritis, history of cancer, depression, fibromyalgia, and hypertension PAIN:  Are you having pain? Yes: NPRS scale: 2=3/10 Pain location: right knee Pain description: constant, throbbing, stabbing, and burning with some spasms Aggravating factors: bending her knee Relieving factors: ice  PRECAUTIONS: Fall  WEIGHT BEARING RESTRICTIONS: No  FALLS:  Has patient fallen in last 6 months? Yes. Number of falls 1; out in her yard  LIVING ENVIRONMENT: Lives with: lives with their spouse Lives in: House/apartment Stairs: Yes: External: 5 steps; can reach both; step  to pattern "up with the good and down with the bad"  Has following equipment at home: Walker - 2 wheeled  OCCUPATION: disabled  PLOF: Independent  PATIENT GOALS: improved mobility and return to yard work   NEXT MD VISIT: unsure  OBJECTIVE:   PATIENT SURVEYS:  FOTO 16.35  COGNITION: Overall cognitive status: Within functional limits for tasks assessed     SENSATION: Patient reports no numbness or tingling  EDEMA:  Circumferential: Right knee joint line: 50 cm Left knee joint line: 43 cm   PALPATION: TTP: right quadriceps, IT band, TFL, hamstrings, hip adductors, and proximal gastrocnemius  LOWER EXTREMITY ROM:  Active ROM Right eval Left eval  Hip flexion    Hip extension    Hip abduction    Hip adduction    Hip internal rotation    Hip external rotation    Knee flexion 64/ 66 (PROM)  129  Knee extension 12 0  Ankle dorsiflexion    Ankle plantarflexion    Ankle inversion    Ankle eversion     (Blank rows = not tested)  LOWER EXTREMITY MMT: not tested due to surgical condition  FUNCTIONAL TESTS:  Required moderate assistance with bed mobility, sit to supine, and supine to sit transfers  GAIT: Assistive device utilized: Environmental consultant - 2 wheeled Level of assistance: Modified independence Comments: Step to pattern with decreased gait speed and stride length secondary to right knee pain   TODAY'S TREATMENT:                                                                                                                              DATE:                                     EXERCISE LOG  Exercise Repetitions and Resistance Comments  Nustep Lvl 3 x 15 mins; seat 10-   Heel/Toe Raises x20 reps each   Rockerboard x2 mins   LAQs x20 reps   Seated marches x20 reps   Clams Red x20 reps   [employer] x20 reps   Knee Slides      Blank cell = exercise not performed today  Modalities: no redness or adverse reaction to today's modalities  Date:  Vaso: Knee, 34  degrees; low pressure, 15 mins, Pain and Edema  PATIENT EDUCATION:  Education details: HEP, plan of care, prognosis, healing, and goals for therapy Person educated: Patient and Spouse Education method: Explanation Education comprehension: verbalized understanding  HOME EXERCISE PROGRAM: The HEP provided to her in the hospital was reviewed and she was able to properly recall these interventions.  ASSESSMENT:  CLINICAL IMPRESSION: Pt arrives for today's treatment session reporting 3/10 right knee pain.  Pt able to progress to seat 8 today on the Nustep with minimal discomfort.  Pt introduced to standing and seated RLE exercises to increase strength, function, and ROM.  Pt requiring min cues for proper technique and posture with all newly added exercises.  Pt able to demonstrate -2 degrees of right knee extension and 92 degrees of right knee flexion.  Pt reports that she has an MD appointment tomorrow.  Normal responses to vaso noted upon removal.  Pt reported 1/10 right knee pain at completion of today's treatment session.   OBJECTIVE IMPAIRMENTS: Abnormal gait, decreased activity tolerance, decreased mobility, difficulty walking, decreased ROM, decreased strength, hypomobility, increased edema, impaired tone, and pain.   ACTIVITY LIMITATIONS: carrying, lifting, standing, squatting, sleeping, stairs, transfers, bed mobility, bathing, toileting, dressing, and locomotion level  PARTICIPATION LIMITATIONS: meal prep, cleaning, laundry, driving, shopping, community activity, and yard work  PERSONAL FACTORS: 3+ comorbidities: Allergies, chronic low back pain, chronic cervical pain, anxiety, arthritis, history of cancer, depression, fibromyalgia, and hypertension  are also affecting patient's functional outcome.   REHAB POTENTIAL: Good  CLINICAL DECISION MAKING: Evolving/moderate complexity  EVALUATION COMPLEXITY: Moderate   GOALS: Goals reviewed with patient? Yes  LONG TERM GOALS: Target  date: 07/11/22  Patient will be independent with her HEP. Baseline:  Goal status: IN PROGRESS  2.  Patient will be able to demonstrate active right knee extension within 5 degrees of neutral for improved gait mechanics. Baseline: -2 degrees of extension Goal status: MET  3.  Patient will be able to demonstrate at least 120 degrees of right knee flexion for improved function navigating stairs. Baseline: 5/6: 92 degrees Goal status: IN PROGRESS  4.  Patient will be able to safely ambulate at least 80 feet without an assistive device for improved household mobility. Baseline:  Goal status: IN PROGRESS   PLAN:  PT FREQUENCY: 2x/week  PT DURATION: 4 weeks  PLANNED INTERVENTIONS: Therapeutic exercises, Therapeutic activity, Neuromuscular re-education, Balance training, Gait training, Patient/Family education, Self Care, Joint mobilization, Stair training, Electrical stimulation, Cryotherapy, Moist heat, Vasopneumatic device, Manual therapy, and Re-evaluation  PLAN FOR NEXT SESSION: NuStep, quad sets, heel slides, passive range of motion, and modalities as needed   Newman Pies, PTA 06/20/2022, 12:07 PM

## 2022-06-22 ENCOUNTER — Ambulatory Visit: Payer: BC Managed Care – PPO

## 2022-06-22 DIAGNOSIS — M25561 Pain in right knee: Secondary | ICD-10-CM | POA: Diagnosis not present

## 2022-06-22 DIAGNOSIS — M25661 Stiffness of right knee, not elsewhere classified: Secondary | ICD-10-CM

## 2022-06-22 DIAGNOSIS — R600 Localized edema: Secondary | ICD-10-CM

## 2022-06-22 NOTE — Therapy (Signed)
OUTPATIENT PHYSICAL THERAPY LOWER EXTREMITY EVALUATION   Patient Name: Elizabeth Cohen MRN: [id number] DOB:01-24-1961, 61 y.o., female Today's Date: 06/22/2022  END OF SESSION:  PT End of Session - 06/22/22 1117     Visit Number 4    Number of Visits 8    Date for PT Re-Evaluation 07/15/22    PT Start Time 1115    PT Stop Time 1215    PT Time Calculation (min) 60 min    Activity Tolerance Patient tolerated treatment well    Behavior During Therapy [facility] for tasks assessed/performed             Past Medical History:  Diagnosis Date   Acid reflux    Anxiety    OCD   Arthritis    Bell's palsy 03/2019   Cancer (HCC)    Skin right arm and left ear, left breast   Complication of anesthesia    slow to wake up during wisdom teeth ext, no other problems with the other surgery   Depression    Fibromyalgia    History of skin cancer    Basal cell, squamous cell   right arm and left ear, left breast    Hypertension    Migraine    Palpitations    PONV (postoperative nausea and vomiting)    Positive ANA (antinuclear antibody)    no problems, no meds   Past Surgical History:  Procedure Laterality Date   ABDOMINAL HYSTERECTOMY     APPENDECTOMY  2002   BACK SURGERY  07/25/2019   Laminectomy and poraminotomy lumbar @ [facility]   CARPAL TUNNEL RELEASE  2015   CHOLECYSTECTOMY  2004   COLONOSCOPY  10/04/2010   Procedure: COLONOSCOPY;  Surgeon: Corbin Ade, MD;  [facility];  Service: Endoscopy;  Laterality: N/A;  8:15AM   COLONOSCOPY WITH PROPOFOL N/A 12/26/2019   Procedure: COLONOSCOPY WITH PROPOFOL;  Surgeon: Corbin Ade, MD;  [facility];  Service: Endoscopy;  Laterality: N/A;  12:30pm   ESOPHAGOGASTRODUODENOSCOPY (EGD) WITH PROPOFOL N/A 12/26/2019   Procedure: ESOPHAGOGASTRODUODENOSCOPY (EGD) WITH PROPOFOL;  Surgeon: Corbin Ade, MD;  [facility];  Service: Endoscopy;  Laterality: N/A;   EXTERNAL EAR SURGERY  Left 01/2018   Skin cancer   FOOT SURGERY Bilateral 2018   KNEE ARTHROSCOPY W/ DEBRIDEMENT Left    POLYPECTOMY  12/26/2019   Procedure: POLYPECTOMY;  Surgeon: Corbin Ade, MD;  [facility];  Service: Endoscopy;;   SKIN CANCER EXCISION Right 09/20/2018   Right arm   TENDON REPAIR     TOTAL KNEE ARTHROPLASTY Right 06/10/2022   Procedure: TOTAL KNEE ARTHROPLASTY;  Surgeon: Beverely Low, MD;  Location: [facility];  Service: Orthopedics;  Laterality: Right;   ULNAR NERVE REPAIR     UPPER GI ENDOSCOPY     WISDOM TOOTH EXTRACTION     Patient Active Problem List   Diagnosis Date Noted   Status post total knee replacement, right 06/10/2022   Lumbar stenosis with neurogenic claudication 08/14/2019   S/P lumbar fusion 08/14/2019   Left-sided Bell's palsy 04/01/2019   Numbness of tongue 04/01/2019   GERD 01/19/2010   CONSTIPATION, CHRONIC 01/19/2010   ABDOMINAL PAIN 01/19/2010    PCP: Richardean Chimera, MD  REFERRING PROVIDER: Beverely Low, MD   REFERRING DIAG: Unilateral primary osteoarthritis, right knee   THERAPY DIAG:  Acute pain of right knee  Stiffness of right knee, not elsewhere classified  Localized edema  Rationale for Evaluation and Treatment: Rehabilitation  ONSET DATE: 06/10/22  SUBJECTIVE:   SUBJECTIVE STATEMENT: Pt denies any pain today, does report some stiffness.  Pt states that MD gave her a a cane.   PERTINENT HISTORY: Allergies, chronic low back pain, chronic cervical pain, anxiety, arthritis, history of cancer, depression, fibromyalgia, and hypertension PAIN:  Are you having pain? No  PRECAUTIONS: Fall  WEIGHT BEARING RESTRICTIONS: No  FALLS:  Has patient fallen in last 6 months? Yes. Number of falls 1; out in her yard  LIVING ENVIRONMENT: Lives with: lives with their spouse Lives in: House/apartment Stairs: Yes: External: 5 steps; can reach both; step to pattern "up with the good and down with the bad"  Has following equipment at  home: Walker - 2 wheeled  OCCUPATION: disabled  PLOF: Independent  PATIENT GOALS: improved mobility and return to yard work   NEXT MD VISIT: unsure  OBJECTIVE:   PATIENT SURVEYS:  FOTO 16.35  COGNITION: Overall cognitive status: Within functional limits for tasks assessed     SENSATION: Patient reports no numbness or tingling  EDEMA:  Circumferential: Right knee joint line: 50 cm Left knee joint line: 43 cm   PALPATION: TTP: right quadriceps, IT band, TFL, hamstrings, hip adductors, and proximal gastrocnemius  LOWER EXTREMITY ROM:  Active ROM Right eval Left eval  Hip flexion    Hip extension    Hip abduction    Hip adduction    Hip internal rotation    Hip external rotation    Knee flexion 64/ 66 (PROM)  129  Knee extension 12 0  Ankle dorsiflexion    Ankle plantarflexion    Ankle inversion    Ankle eversion     (Blank rows = not tested)  LOWER EXTREMITY MMT: not tested due to surgical condition  FUNCTIONAL TESTS:  Required moderate assistance with bed mobility, sit to supine, and supine to sit transfers  GAIT: Assistive device utilized: Environmental consultant - 2 wheeled Level of assistance: Modified independence Comments: Step to pattern with decreased gait speed and stride length secondary to right knee pain   TODAY'S TREATMENT:                                                                                                                              DATE:                                     EXERCISE LOG  Exercise Repetitions and Resistance Comments  Nustep Lvl 3 x 18 mins; seat 9-7   Heel/Toe Raises Discontinued due to foot pain   Rockerboard Discontinued due to foot pain   Gastroc Stretch 30 sec x 3 reps   LAQs 2# x20 reps   Seated marches 2# x20 reps   Clams Red x25 reps   Ball Squeeze x25 reps   Ham Curls Red x 20 reps  Knee Slides X25 reps   Gait SPC 127 ft     Blank cell = exercise not performed today  Modalities: no redness or adverse reaction  to today's modalities  Date:  Vaso: Knee, 34 degrees; low pressure, 15 mins, Pain and Edema  PATIENT EDUCATION:  Education details: HEP, plan of care, prognosis, healing, and goals for therapy Person educated: Patient and Spouse Education method: Explanation Education comprehension: verbalized understanding  HOME EXERCISE PROGRAM: The HEP provided to her in the hospital was reviewed and she was able to properly recall these interventions.  ASSESSMENT:  CLINICAL IMPRESSION: Pt arrives for today's treatment session denying any pain.  Pt able to progress to seat 7 on the Nustep today without issue or discomfort.  Pt instructed in ambulation with SPC x 127 ft.  Pt requiring min cues for sequencing and step length.  Pt able to tolerate increased reps or weight with all exercises today without issue.  Normal responses to vaso noted upon removal.  Pt denied any pain at completion of today's treatment session.  OBJECTIVE IMPAIRMENTS: Abnormal gait, decreased activity tolerance, decreased mobility, difficulty walking, decreased ROM, decreased strength, hypomobility, increased edema, impaired tone, and pain.   ACTIVITY LIMITATIONS: carrying, lifting, standing, squatting, sleeping, stairs, transfers, bed mobility, bathing, toileting, dressing, and locomotion level  PARTICIPATION LIMITATIONS: meal prep, cleaning, laundry, driving, shopping, community activity, and yard work  PERSONAL FACTORS: 3+ comorbidities: Allergies, chronic low back pain, chronic cervical pain, anxiety, arthritis, history of cancer, depression, fibromyalgia, and hypertension  are also affecting patient's functional outcome.   REHAB POTENTIAL: Good  CLINICAL DECISION MAKING: Evolving/moderate complexity  EVALUATION COMPLEXITY: Moderate   GOALS: Goals reviewed with patient? Yes  LONG TERM GOALS: Target date: 07/11/22  Patient will be independent with her HEP. Baseline:  Goal status: IN PROGRESS  2.  Patient will be  able to demonstrate active right knee extension within 5 degrees of neutral for improved gait mechanics. Baseline: -2 degrees of extension Goal status: MET  3.  Patient will be able to demonstrate at least 120 degrees of right knee flexion for improved function navigating stairs. Baseline: 5/6: 92 degrees Goal status: IN PROGRESS  4.  Patient will be able to safely ambulate at least 80 feet without an assistive device for improved household mobility. Baseline:  Goal status: IN PROGRESS   PLAN:  PT FREQUENCY: 2x/week  PT DURATION: 4 weeks  PLANNED INTERVENTIONS: Therapeutic exercises, Therapeutic activity, Neuromuscular re-education, Balance training, Gait training, Patient/Family education, Self Care, Joint mobilization, Stair training, Electrical stimulation, Cryotherapy, Moist heat, Vasopneumatic device, Manual therapy, and Re-evaluation  PLAN FOR NEXT SESSION: NuStep, quad sets, heel slides, passive range of motion, and modalities as needed   Newman Pies, PTA 06/22/2022, 12:18 PM

## 2022-06-27 ENCOUNTER — Ambulatory Visit: Payer: BC Managed Care – PPO

## 2022-06-27 DIAGNOSIS — R600 Localized edema: Secondary | ICD-10-CM

## 2022-06-27 DIAGNOSIS — M25661 Stiffness of right knee, not elsewhere classified: Secondary | ICD-10-CM

## 2022-06-27 DIAGNOSIS — M25561 Pain in right knee: Secondary | ICD-10-CM | POA: Diagnosis not present

## 2022-06-27 NOTE — Therapy (Signed)
OUTPATIENT PHYSICAL THERAPY LOWER EXTREMITY EVALUATION   Patient Name: Elizabeth Cohen MRN: [id number] DOB:[birth year], 62 y.o., female Today's Date: 06/27/2022  END OF SESSION:  PT End of Session - 06/27/22 1120     Visit Number 5    Number of Visits 8    Date for PT Re-Evaluation 07/15/22    PT Start Time 1115    PT Stop Time 1207    PT Time Calculation (min) 52 min    Activity Tolerance Patient tolerated treatment well    Behavior During Therapy [facility] for tasks assessed/performed             Past Medical History:  Diagnosis Date   Acid reflux    Anxiety    OCD   Arthritis    Bell's palsy 03/2019   Cancer (HCC)    Skin right arm and left ear, left breast   Complication of anesthesia    slow to wake up during wisdom teeth ext, no other problems with the other surgery   Depression    Fibromyalgia    History of skin cancer    Basal cell, squamous cell   right arm and left ear, left breast    Hypertension    Migraine    Palpitations    PONV (postoperative nausea and vomiting)    Positive ANA (antinuclear antibody)    no problems, no meds   Past Surgical History:  Procedure Laterality Date   ABDOMINAL HYSTERECTOMY     APPENDECTOMY  2002   BACK SURGERY  07/25/2019   Laminectomy and poraminotomy lumbar @ [facility]   CARPAL TUNNEL RELEASE  2015   CHOLECYSTECTOMY  2004   COLONOSCOPY  10/04/2010   Procedure: COLONOSCOPY;  Surgeon: Corbin Ade, MD;  [facility];  Service: Endoscopy;  Laterality: N/A;  8:15AM   COLONOSCOPY WITH PROPOFOL N/A 12/26/2019   Procedure: COLONOSCOPY WITH PROPOFOL;  Surgeon: Corbin Ade, MD;  [facility];  Service: Endoscopy;  Laterality: N/A;  12:30pm   ESOPHAGOGASTRODUODENOSCOPY (EGD) WITH PROPOFOL N/A 12/26/2019   Procedure: ESOPHAGOGASTRODUODENOSCOPY (EGD) WITH PROPOFOL;  Surgeon: Corbin Ade, MD;  [facility];  Service: Endoscopy;  Laterality: N/A;   EXTERNAL EAR SURGERY  Left 01/2018   Skin cancer   FOOT SURGERY Bilateral 2018   KNEE ARTHROSCOPY W/ DEBRIDEMENT Left    POLYPECTOMY  12/26/2019   Procedure: POLYPECTOMY;  Surgeon: Corbin Ade, MD;  [facility];  Service: Endoscopy;;   SKIN CANCER EXCISION Right 09/20/2018   Right arm   TENDON REPAIR     TOTAL KNEE ARTHROPLASTY Right 06/10/2022   Procedure: TOTAL KNEE ARTHROPLASTY;  Surgeon: Beverely Low, MD;  Location: [facility];  Service: Orthopedics;  Laterality: Right;   ULNAR NERVE REPAIR     UPPER GI ENDOSCOPY     WISDOM TOOTH EXTRACTION     Patient Active Problem List   Diagnosis Date Noted   Status post total knee replacement, right 06/10/2022   Lumbar stenosis with neurogenic claudication 08/14/2019   S/P lumbar fusion 08/14/2019   Left-sided Bell's palsy 04/01/2019   Numbness of tongue 04/01/2019   GERD 01/19/2010   CONSTIPATION, CHRONIC 01/19/2010   ABDOMINAL PAIN 01/19/2010    PCP: Richardean Chimera, MD  REFERRING PROVIDER: Beverely Low, MD   REFERRING DIAG: Unilateral primary osteoarthritis, right knee   THERAPY DIAG:  Acute pain of right knee  Stiffness of right knee, not elsewhere classified  Localized edema  Rationale for Evaluation and Treatment: Rehabilitation  ONSET DATE: 06/10/22  SUBJECTIVE:   SUBJECTIVE STATEMENT: Pt denies any pain today, does report some stiffness.  Pt reports right foot nerve pain.  PERTINENT HISTORY: Right, chronic low back pain, chronic cervical pain, anxiety, arthritis, history of cancer, depression, fibromyalgia, and hypertension PAIN:  Are you having pain? No  PRECAUTIONS: Fall  WEIGHT BEARING RESTRICTIONS: No  FALLS:  Has patient fallen in last 6 months? Yes. Number of falls 1; out in her yard  LIVING ENVIRONMENT: Lives with: lives with their spouse Lives in: House/apartment Stairs: Yes: External: 5 steps; can reach both; step to pattern "up with the good and down with the bad"  Has following equipment at home:  Walker - 2 wheeled  OCCUPATION: disabled  PLOF: Independent  PATIENT GOALS: improved mobility and return to yard work   NEXT MD VISIT: unsure  OBJECTIVE:   PATIENT SURVEYS:  FOTO 16.35  COGNITION: Overall cognitive status: Within functional limits for tasks assessed     SENSATION: Patient reports no numbness or tingling  EDEMA:  Circumferential: Right knee joint line: 50 cm Left knee joint line: 43 cm   PALPATION: TTP: right quadriceps, IT band, TFL, hamstrings, hip adductors, and proximal gastrocnemius  LOWER EXTREMITY ROM:  Active ROM Right eval Left eval  Hip flexion    Hip extension    Hip abduction    Hip adduction    Hip internal rotation    Hip external rotation    Knee flexion 64/ 66 (PROM)  129  Knee extension 12 0  Ankle dorsiflexion    Ankle plantarflexion    Ankle inversion    Ankle eversion     (Blank rows = not tested)  LOWER EXTREMITY MMT: not tested due to surgical condition  FUNCTIONAL TESTS:  Required moderate assistance with bed mobility, sit to supine, and supine to sit transfers  GAIT: Assistive device utilized: Environmental consultant - 2 wheeled Level of assistance: Modified independence Comments: Step to pattern with decreased gait speed and stride length secondary to right knee pain   TODAY'S TREATMENT:                                                                                                                              DATE:                                     EXERCISE LOG  Exercise Repetitions and Resistance Comments  Nustep Lvl 3 x 15 mins; seat 9-6   Recumbent bike Lvl 1 x 5 mins; seat 6   Heel/Toe Raises Discontinued due to foot pain   Rockerboard Discontinued due to foot pain   Gastroc Stretch 30 sec x 3 reps   LAQs 2# x25 reps   Seated marches 2# x25 reps   Clams Red x30 reps   [employer] x30 reps  Ham Curls Red x 25 reps   Knee Slides X25 reps   Gait      Blank cell = exercise not performed today  Modalities: no  redness or adverse reaction to today's modalities  Date:  Vaso: Knee, 34 degrees; low pressure, 10 mins, Pain and Edema  PATIENT EDUCATION:  Education details: HEP, plan of care, prognosis, healing, and goals for therapy Person educated: Patient and Spouse Education method: Explanation Education comprehension: verbalized understanding  HOME EXERCISE PROGRAM: The HEP provided to her in the hospital was reviewed and she was able to properly recall these interventions.  ASSESSMENT:  CLINICAL IMPRESSION: Pt arrives for today's treatment session reporting right knee stiffness.  Pt reports 6/10 right foot pain.  Pt states that pain is burning in nature.  Pt able to tolerate introduction to recumbent bike today.  Pt able to make forward rotations on first attempt.  Pt able to tolerate increased reps with all exercises today.   Pt would benefit from progression to standing exercises at next treatment session.  Normal responses to vaso noted upon removal.  Pt denied any pain at completion of today's treatment session.  OBJECTIVE IMPAIRMENTS: Abnormal gait, decreased activity tolerance, decreased mobility, difficulty walking, decreased ROM, decreased strength, hypomobility, increased edema, impaired tone, and pain.   ACTIVITY LIMITATIONS: carrying, lifting, standing, squatting, sleeping, stairs, transfers, bed mobility, bathing, toileting, dressing, and locomotion level  PARTICIPATION LIMITATIONS: meal prep, cleaning, laundry, driving, shopping, community activity, and yard work  PERSONAL FACTORS: 3+ comorbidities: Allergies, chronic low back pain, chronic cervical pain, anxiety, arthritis, history of cancer, depression, fibromyalgia, and hypertension  are also affecting patient's functional outcome.   REHAB POTENTIAL: Good  CLINICAL DECISION MAKING: Evolving/moderate complexity  EVALUATION COMPLEXITY: Moderate   GOALS: Goals reviewed with patient? Yes  LONG TERM GOALS: Target date:  07/11/22  Patient will be independent with her HEP. Baseline:  Goal status: IN PROGRESS  2.  Patient will be able to demonstrate active right knee extension within 5 degrees of neutral for improved gait mechanics. Baseline: -2 degrees of extension Goal status: MET  3.  Patient will be able to demonstrate at least 120 degrees of right knee flexion for improved function navigating stairs. Baseline: 5/6: 92 degrees Goal status: IN PROGRESS  4.  Patient will be able to safely ambulate at least 80 feet without an assistive device for improved household mobility. Baseline:  Goal status: IN PROGRESS   PLAN:  PT FREQUENCY: 2x/week  PT DURATION: 4 weeks  PLANNED INTERVENTIONS: Therapeutic exercises, Therapeutic activity, Neuromuscular re-education, Balance training, Gait training, Patient/Family education, Self Care, Joint mobilization, Stair training, Electrical stimulation, Cryotherapy, Moist heat, Vasopneumatic device, Manual therapy, and Re-evaluation  PLAN FOR NEXT SESSION: NuStep, quad sets, heel slides, passive range of motion, and modalities as needed   Newman Pies, PTA 06/27/2022, 12:08 PM

## 2022-07-01 ENCOUNTER — Ambulatory Visit: Payer: BC Managed Care – PPO

## 2022-07-01 DIAGNOSIS — R600 Localized edema: Secondary | ICD-10-CM

## 2022-07-01 DIAGNOSIS — M25561 Pain in right knee: Secondary | ICD-10-CM | POA: Diagnosis not present

## 2022-07-01 DIAGNOSIS — M25661 Stiffness of right knee, not elsewhere classified: Secondary | ICD-10-CM

## 2022-07-01 NOTE — Therapy (Signed)
OUTPATIENT PHYSICAL THERAPY LOWER EXTREMITY TREATMENT   Patient Name: Elizabeth Cohen MRN: [id number] DOB:1960/06/18, 62 y.o., female Today's Date: 07/01/2022  END OF SESSION:  PT End of Session - 07/01/22 1117     Visit Number 6    Number of Visits 8    Date for PT Re-Evaluation 07/15/22    PT Start Time 1115    PT Stop Time 1206    PT Time Calculation (min) 51 min    Activity Tolerance Patient tolerated treatment well    Behavior During Therapy [facility] for tasks assessed/performed             Past Medical History:  Diagnosis Date   Acid reflux    Anxiety    OCD   Arthritis    Bell's palsy 03/2019   Cancer (HCC)    Skin right arm and left ear, left breast   Complication of anesthesia    slow to wake up during wisdom teeth ext, no other problems with the other surgery   Depression    Fibromyalgia    History of skin cancer    Basal cell, squamous cell   right arm and left ear, left breast    Hypertension    Migraine    Palpitations    PONV (postoperative nausea and vomiting)    Positive ANA (antinuclear antibody)    no problems, no meds   Past Surgical History:  Procedure Laterality Date   ABDOMINAL HYSTERECTOMY     APPENDECTOMY  2002   BACK SURGERY  07/25/2019   Laminectomy and poraminotomy lumbar @ [facility]   CARPAL TUNNEL RELEASE  2015   CHOLECYSTECTOMY  2004   COLONOSCOPY  10/04/2010   Procedure: COLONOSCOPY;  Surgeon: Corbin Ade, MD;  [facility];  Service: Endoscopy;  Laterality: N/A;  8:15AM   COLONOSCOPY WITH PROPOFOL N/A 12/26/2019   Procedure: COLONOSCOPY WITH PROPOFOL;  Surgeon: Corbin Ade, MD;  [facility];  Service: Endoscopy;  Laterality: N/A;  12:30pm   ESOPHAGOGASTRODUODENOSCOPY (EGD) WITH PROPOFOL N/A 12/26/2019   Procedure: ESOPHAGOGASTRODUODENOSCOPY (EGD) WITH PROPOFOL;  Surgeon: Corbin Ade, MD;  [facility];  Service: Endoscopy;  Laterality: N/A;   EXTERNAL EAR SURGERY  Left 01/2018   Skin cancer   FOOT SURGERY Bilateral 2018   KNEE ARTHROSCOPY W/ DEBRIDEMENT Left    POLYPECTOMY  12/26/2019   Procedure: POLYPECTOMY;  Surgeon: Corbin Ade, MD;  [facility];  Service: Endoscopy;;   SKIN CANCER EXCISION Right 09/20/2018   Right arm   TENDON REPAIR     TOTAL KNEE ARTHROPLASTY Right 06/10/2022   Procedure: TOTAL KNEE ARTHROPLASTY;  Surgeon: Beverely Low, MD;  Location: [facility];  Service: Orthopedics;  Laterality: Right;   ULNAR NERVE REPAIR     UPPER GI ENDOSCOPY     WISDOM TOOTH EXTRACTION     Patient Active Problem List   Diagnosis Date Noted   Status post total knee replacement, right 06/10/2022   Lumbar stenosis with neurogenic claudication 08/14/2019   S/P lumbar fusion 08/14/2019   Left-sided Bell's palsy 04/01/2019   Numbness of tongue 04/01/2019   GERD 01/19/2010   CONSTIPATION, CHRONIC 01/19/2010   ABDOMINAL PAIN 01/19/2010    PCP: Richardean Chimera, MD  REFERRING PROVIDER: Beverely Low, MD   REFERRING DIAG: Unilateral primary osteoarthritis, right knee   THERAPY DIAG:  Acute pain of right knee  Stiffness of right knee, not elsewhere classified  Localized edema  Rationale for Evaluation and Treatment: Rehabilitation  ONSET DATE: 06/10/22  SUBJECTIVE:   SUBJECTIVE STATEMENT: Pt denies any pain today, does report some stiffness.    PERTINENT HISTORY: Right, chronic low back pain, chronic cervical pain, anxiety, arthritis, history of cancer, depression, fibromyalgia, and hypertension PAIN:  Are you having pain? No  PRECAUTIONS: Fall  WEIGHT BEARING RESTRICTIONS: No  FALLS:  Has patient fallen in last 6 months? Yes. Number of falls 1; out in her yard  LIVING ENVIRONMENT: Lives with: lives with their spouse Lives in: House/apartment Stairs: Yes: External: 5 steps; can reach both; step to pattern "up with the good and down with the bad"  Has following equipment at home: Walker - 2 wheeled  OCCUPATION:  disabled  PLOF: Independent  PATIENT GOALS: improved mobility and return to yard work   NEXT MD VISIT: unsure  OBJECTIVE:   PATIENT SURVEYS:  FOTO 16.35  COGNITION: Overall cognitive status: Within functional limits for tasks assessed     SENSATION: Patient reports no numbness or tingling  EDEMA:  Circumferential: Right knee joint line: 50 cm Left knee joint line: 43 cm   PALPATION: TTP: right quadriceps, IT band, TFL, hamstrings, hip adductors, and proximal gastrocnemius  LOWER EXTREMITY ROM:  Active ROM Right eval Left eval  Hip flexion    Hip extension    Hip abduction    Hip adduction    Hip internal rotation    Hip external rotation    Knee flexion 64/ 66 (PROM)  129  Knee extension 12 0  Ankle dorsiflexion    Ankle plantarflexion    Ankle inversion    Ankle eversion     (Blank rows = not tested)  LOWER EXTREMITY MMT: not tested due to surgical condition  FUNCTIONAL TESTS:  Required moderate assistance with bed mobility, sit to supine, and supine to sit transfers  GAIT: Assistive device utilized: Environmental consultant - 2 wheeled Level of assistance: Modified independence Comments: Step to pattern with decreased gait speed and stride length secondary to right knee pain   TODAY'S TREATMENT:                                                                                                                              DATE:                                     EXERCISE LOG  Exercise Repetitions and Resistance Comments  Nustep Lvl 3 x 5 mins; seat 7   Recumbent bike Lvl 2 x 15 mins; seat 5   Heel/Toe Raises Discontinued due to foot pain   Rockerboard Discontinued due to foot pain   Lunges 10" box x 3 mins   Forward Step Ups 6" box x 20 reps   LAQs 2# x30 reps   Seated marches 2# x30 reps   Clams Red x30 reps   [employer]  Squeeze x30 reps   Ham Curls Red x 30 reps             Blank cell = exercise not performed today  Modalities: no redness or adverse reaction to  today's modalities  Date:  Vaso: Knee, 34 degrees; low pressure, 10 mins, Pain and Edema  PATIENT EDUCATION:  Education details: HEP, plan of care, prognosis, healing, and goals for therapy Person educated: Patient and Spouse Education method: Explanation Education comprehension: verbalized understanding  HOME EXERCISE PROGRAM: The HEP provided to her in the hospital was reviewed and she was able to properly recall these interventions.  ASSESSMENT:  CLINICAL IMPRESSION: Pt arrives for today's treatment session denying any pain, but does report stiffness.  Pt introduced to standing lunges and forward step ups today with min cues for proper technique and sequencing.  Pt also able to tolerate increased reps with all previously performed exercises.  Normal responses to vaso noted upon removal.  Pt denied any pain at completion of today's treatment session.  OBJECTIVE IMPAIRMENTS: Abnormal gait, decreased activity tolerance, decreased mobility, difficulty walking, decreased ROM, decreased strength, hypomobility, increased edema, impaired tone, and pain.   ACTIVITY LIMITATIONS: carrying, lifting, standing, squatting, sleeping, stairs, transfers, bed mobility, bathing, toileting, dressing, and locomotion level  PARTICIPATION LIMITATIONS: meal prep, cleaning, laundry, driving, shopping, community activity, and yard work  PERSONAL FACTORS: 3+ comorbidities: Allergies, chronic low back pain, chronic cervical pain, anxiety, arthritis, history of cancer, depression, fibromyalgia, and hypertension  are also affecting patient's functional outcome.   REHAB POTENTIAL: Good  CLINICAL DECISION MAKING: Evolving/moderate complexity  EVALUATION COMPLEXITY: Moderate   GOALS: Goals reviewed with patient? Yes  LONG TERM GOALS: Target date: 07/11/22  Patient will be independent with her HEP. Baseline:  Goal status: IN PROGRESS  2.  Patient will be able to demonstrate active right knee extension  within 5 degrees of neutral for improved gait mechanics. Baseline: -2 degrees of extension Goal status: MET  3.  Patient will be able to demonstrate at least 120 degrees of right knee flexion for improved function navigating stairs. Baseline: 5/6: 92 degrees Goal status: IN PROGRESS  4.  Patient will be able to safely ambulate at least 80 feet without an assistive device for improved household mobility. Baseline:  Goal status: MET   PLAN:  PT FREQUENCY: 2x/week  PT DURATION: 4 weeks  PLANNED INTERVENTIONS: Therapeutic exercises, Therapeutic activity, Neuromuscular re-education, Balance training, Gait training, Patient/Family education, Self Care, Joint mobilization, Stair training, Electrical stimulation, Cryotherapy, Moist heat, Vasopneumatic device, Manual therapy, and Re-evaluation  PLAN FOR NEXT SESSION: NuStep, quad sets, heel slides, passive range of motion, and modalities as needed   Newman Pies, PTA 07/01/2022, 12:07 PM

## 2022-07-04 ENCOUNTER — Other Ambulatory Visit: Payer: Self-pay | Admitting: Gastroenterology

## 2022-07-05 ENCOUNTER — Encounter: Payer: Self-pay | Admitting: *Deleted

## 2022-07-05 ENCOUNTER — Ambulatory Visit: Payer: BC Managed Care – PPO | Admitting: *Deleted

## 2022-07-05 DIAGNOSIS — M25561 Pain in right knee: Secondary | ICD-10-CM | POA: Diagnosis not present

## 2022-07-05 DIAGNOSIS — R600 Localized edema: Secondary | ICD-10-CM

## 2022-07-05 DIAGNOSIS — M25661 Stiffness of right knee, not elsewhere classified: Secondary | ICD-10-CM

## 2022-07-05 NOTE — Therapy (Signed)
OUTPATIENT PHYSICAL THERAPY LOWER EXTREMITY TREATMENT   Patient Name: Elizabeth Cohen MRN: [id number] DOB:Sep 26, 1960, 62 y.o., female Today's Date: 07/05/2022  END OF SESSION:  PT End of Session - 07/05/22 1522     Visit Number 7    Number of Visits 8    Date for PT Re-Evaluation 07/15/22    PT Start Time 1515    PT Stop Time 1612    PT Time Calculation (min) 57 min             Past Medical History:  Diagnosis Date   Acid reflux    Anxiety    OCD   Arthritis    Bell's palsy 03/2019   Cancer (HCC)    Skin right arm and left ear, left breast   Complication of anesthesia    slow to wake up during wisdom teeth ext, no other problems with the other surgery   Depression    Fibromyalgia    History of skin cancer    Basal cell, squamous cell   right arm and left ear, left breast    Hypertension    Migraine    Palpitations    PONV (postoperative nausea and vomiting)    Positive ANA (antinuclear antibody)    no problems, no meds   Past Surgical History:  Procedure Laterality Date   ABDOMINAL HYSTERECTOMY     APPENDECTOMY  2002   BACK SURGERY  07/25/2019   Laminectomy and poraminotomy lumbar @ [facility]   CARPAL TUNNEL RELEASE  2015   CHOLECYSTECTOMY  2004   COLONOSCOPY  10/04/2010   Procedure: COLONOSCOPY;  Surgeon: Corbin Ade, MD;  Location: [facility];  Service: Endoscopy;  Laterality: N/A;  8:15AM   COLONOSCOPY WITH PROPOFOL N/A 12/26/2019   Procedure: COLONOSCOPY WITH PROPOFOL;  Surgeon: Corbin Ade, MD;  Location: [facility];  Service: Endoscopy;  Laterality: N/A;  12:30pm   ESOPHAGOGASTRODUODENOSCOPY (EGD) WITH PROPOFOL N/A 12/26/2019   Procedure: ESOPHAGOGASTRODUODENOSCOPY (EGD) WITH PROPOFOL;  Surgeon: Corbin Ade, MD;  Location: [facility];  Service: Endoscopy;  Laterality: N/A;   EXTERNAL EAR SURGERY Left 01/2018   Skin cancer   FOOT SURGERY Bilateral 2018   KNEE ARTHROSCOPY W/ DEBRIDEMENT Left    POLYPECTOMY   12/26/2019   Procedure: POLYPECTOMY;  Surgeon: Corbin Ade, MD;  Location: [facility];  Service: Endoscopy;;   SKIN CANCER EXCISION Right 09/20/2018   Right arm   TENDON REPAIR     TOTAL KNEE ARTHROPLASTY Right 06/10/2022   Procedure: TOTAL KNEE ARTHROPLASTY;  Surgeon: Beverely Low, MD;  Location: [facility];  Service: Orthopedics;  Laterality: Right;   ULNAR NERVE REPAIR     UPPER GI ENDOSCOPY     WISDOM TOOTH EXTRACTION     Patient Active Problem List   Diagnosis Date Noted   Status post total knee replacement, right 06/10/2022   Lumbar stenosis with neurogenic claudication 08/14/2019   S/P lumbar fusion 08/14/2019   Left-sided Bell's palsy 04/01/2019   Numbness of tongue 04/01/2019   GERD 01/19/2010   CONSTIPATION, CHRONIC 01/19/2010   ABDOMINAL PAIN 01/19/2010    PCP: Richardean Chimera, MD  REFERRING PROVIDER: Beverely Low, MD   REFERRING DIAG: Unilateral primary osteoarthritis, right knee   THERAPY DIAG:  Acute pain of right knee  Stiffness of right knee, not elsewhere classified  Localized edema  Rationale for Evaluation and Treatment: Rehabilitation  ONSET DATE: 06/10/22  SUBJECTIVE:   SUBJECTIVE STATEMENT: Pt denies  any pain today, does report some stiffness.    PERTINENT HISTORY: Right, chronic low back pain, chronic cervical pain, anxiety, arthritis, history of cancer, depression, fibromyalgia, and hypertension PAIN:  Are you having pain? No  PRECAUTIONS: Fall  WEIGHT BEARING RESTRICTIONS: No  FALLS:  Has patient fallen in last 6 months? Yes. Number of falls 1; out in her yard  LIVING ENVIRONMENT: Lives with: lives with their spouse Lives in: House/apartment Stairs: Yes: External: 5 steps; can reach both; step to pattern "up with the good and down with the bad"  Has following equipment at home: Walker - 2 wheeled  OCCUPATION: disabled  PLOF: Independent  PATIENT GOALS: improved mobility and return to yard work   NEXT MD VISIT:  unsure  OBJECTIVE:   PATIENT SURVEYS:  FOTO 16.35  COGNITION: Overall cognitive status: Within functional limits for tasks assessed     SENSATION: Patient reports no numbness or tingling  EDEMA:  Circumferential: Right knee joint line: 50 cm Left knee joint line: 43 cm   PALPATION: TTP: right quadriceps, IT band, TFL, hamstrings, hip adductors, and proximal gastrocnemius  LOWER EXTREMITY ROM:  Active ROM Right eval Left eval  Hip flexion    Hip extension    Hip abduction    Hip adduction    Hip internal rotation    Hip external rotation    Knee flexion 64/ 66 (PROM)  129  Knee extension 12 0  Ankle dorsiflexion    Ankle plantarflexion    Ankle inversion    Ankle eversion     (Blank rows = not tested)  LOWER EXTREMITY MMT: not tested due to surgical condition  FUNCTIONAL TESTS:  Required moderate assistance with bed mobility, sit to supine, and supine to sit transfers  GAIT: Assistive device utilized: Environmental consultant - 2 wheeled Level of assistance: Modified independence Comments: Step to pattern with decreased gait speed and stride length secondary to right knee pain   TODAY'S TREATMENT:                                                                                                                              DATE:                                                                             07-05-22                                   EXERCISE LOG  Exercise Repetitions and Resistance Comments  Nustep Lvl 3 x 5 mins; seat 7   Recumbent bike Lvl 2 x 15 mins; seat 5  Knee ext 10# 3x10-15   Knee flex 20# 3 x10-15   Heel/Toe Raises Discontinued due to foot pain   Rockerboard Discontinued due to foot pain   Lunges 10" box x 4    mins   Forward Step Ups    LAQs    Seated marches    Clams    Ball Squeeze    Ham Curls              Blank cell = exercise not performed today  Modalities: no redness or adverse reaction to today's modalities  Manual PROM RT knee.  Flexion to 120 degrees today, Scar massage Date:  Vaso: Knee, 34 degrees; low pressure, 10 mins, Pain and Edema  PATIENT EDUCATION:  Education details: HEP, plan of care, prognosis, healing, and goals for therapy Person educated: Patient and Spouse Education method: Explanation Education comprehension: verbalized understanding  HOME EXERCISE PROGRAM: The HEP provided to her in the hospital was reviewed and she was able to properly recall these interventions.  ASSESSMENT:  CLINICAL IMPRESSION: Pt arrived today doing fairly well with RT knee. Rx  continued with RT LE ROM progression and LE strengthening. She will f/u with MD in June and has 1 more visit before f/u.Mainly fatigue end of session.PROM 120 degrees today     OBJECTIVE IMPAIRMENTS: Abnormal gait, decreased activity tolerance, decreased mobility, difficulty walking, decreased ROM, decreased strength, hypomobility, increased edema, impaired tone, and pain.   ACTIVITY LIMITATIONS: carrying, lifting, standing, squatting, sleeping, stairs, transfers, bed mobility, bathing, toileting, dressing, and locomotion level  PARTICIPATION LIMITATIONS: meal prep, cleaning, laundry, driving, shopping, community activity, and yard work  PERSONAL FACTORS: 3+ comorbidities: Allergies, chronic low back pain, chronic cervical pain, anxiety, arthritis, history of cancer, depression, fibromyalgia, and hypertension  are also affecting patient's functional outcome.   REHAB POTENTIAL: Good  CLINICAL DECISION MAKING: Evolving/moderate complexity  EVALUATION COMPLEXITY: Moderate   GOALS: Goals reviewed with patient? Yes  LONG TERM GOALS: Target date: 07/11/22  Patient will be independent with her HEP. Baseline:  Goal status: IN PROGRESS  2.  Patient will be able to demonstrate active right knee extension within 5 degrees of neutral for improved gait mechanics. Baseline: -2 degrees of extension Goal status: MET  3.  Patient will be able to  demonstrate at least 120 degrees of right knee flexion for improved function navigating stairs. Baseline: 5/6: 92 degrees Goal status: MET   4.  Patient will be able to safely ambulate at least 80 feet without an assistive device for improved household mobility. Baseline:  Goal status: MET   PLAN:  PT FREQUENCY: 2x/week  PT DURATION: 4 weeks  PLANNED INTERVENTIONS: Therapeutic exercises, Therapeutic activity, Neuromuscular re-education, Balance training, Gait training, Patient/Family education, Self Care, Joint mobilization, Stair training, Electrical stimulation, Cryotherapy, Moist heat, Vasopneumatic device, Manual therapy, and Re-evaluation  PLAN FOR NEXT SESSION: MD note and FOTO   Jenelle Drennon,CHRIS, PTA 07/05/2022, 4:28 PM

## 2022-07-12 ENCOUNTER — Ambulatory Visit: Payer: BC Managed Care – PPO

## 2022-07-12 DIAGNOSIS — M25561 Pain in right knee: Secondary | ICD-10-CM

## 2022-07-12 DIAGNOSIS — R600 Localized edema: Secondary | ICD-10-CM

## 2022-07-12 DIAGNOSIS — M25661 Stiffness of right knee, not elsewhere classified: Secondary | ICD-10-CM

## 2022-07-12 NOTE — Therapy (Addendum)
OUTPATIENT PHYSICAL THERAPY LOWER EXTREMITY TREATMENT   Patient Name: Elizabeth Cohen MRN: [id number] DOB:[birth year], 62 y.o., female Today's Date: 07/12/2022  END OF SESSION:  PT End of Session - 07/12/22 1306     Visit Number 8    Number of Visits 8    Date for PT Re-Evaluation 07/15/22    PT Start Time 1300    PT Stop Time 1352    PT Time Calculation (min) 52 min             Past Medical History:  Diagnosis Date   Acid reflux    Anxiety    OCD   Arthritis    Bell's palsy 03/2019   Cancer (HCC)    Skin right arm and left ear, left breast   Complication of anesthesia    slow to wake up during wisdom teeth ext, no other problems with the other surgery   Depression    Fibromyalgia    History of skin cancer    Basal cell, squamous cell   right arm and left ear, left breast    Hypertension    Migraine    Palpitations    PONV (postoperative nausea and vomiting)    Positive ANA (antinuclear antibody)    no problems, no meds   Past Surgical History:  Procedure Laterality Date   ABDOMINAL HYSTERECTOMY     APPENDECTOMY  2002   BACK SURGERY  07/25/2019   Laminectomy and poraminotomy lumbar @ [facility]   CARPAL TUNNEL RELEASE  2015   CHOLECYSTECTOMY  2004   COLONOSCOPY  10/04/2010   Procedure: COLONOSCOPY;  Surgeon: Corbin Ade, MD;  [facility];  Service: Endoscopy;  Laterality: N/A;  8:15AM   COLONOSCOPY WITH PROPOFOL N/A 12/26/2019   Procedure: COLONOSCOPY WITH PROPOFOL;  Surgeon: Corbin Ade, MD;  [facility]: [facility];  Service: Endoscopy;  Laterality: N/A;  12:30pm   ESOPHAGOGASTRODUODENOSCOPY (EGD) WITH PROPOFOL N/A 12/26/2019   Procedure: ESOPHAGOGASTRODUODENOSCOPY (EGD) WITH PROPOFOL;  Surgeon: Corbin Ade, MD;  [facility]: [facility];  Service: Endoscopy;  Laterality: N/A;   EXTERNAL EAR SURGERY Left 01/2018   Skin cancer   FOOT SURGERY Bilateral 2018   KNEE ARTHROSCOPY W/ DEBRIDEMENT Left    POLYPECTOMY   12/26/2019   Procedure: POLYPECTOMY;  Surgeon: Corbin Ade, MD;  [facility]: [facility];  Service: Endoscopy;;   SKIN CANCER EXCISION Right 09/20/2018   Right arm   TENDON REPAIR     TOTAL KNEE ARTHROPLASTY Right 06/10/2022   Procedure: TOTAL KNEE ARTHROPLASTY;  Surgeon: Beverely Low, MD;  Location: [facility];  Service: Orthopedics;  Laterality: Right;   ULNAR NERVE REPAIR     UPPER GI ENDOSCOPY     WISDOM TOOTH EXTRACTION     Patient Active Problem List   Diagnosis Date Noted   Status post total knee replacement, right 06/10/2022   Lumbar stenosis with neurogenic claudication 08/14/2019   S/P lumbar fusion 08/14/2019   Left-sided Bell's palsy 04/01/2019   Numbness of tongue 04/01/2019   GERD 01/19/2010   CONSTIPATION, CHRONIC 01/19/2010   ABDOMINAL PAIN 01/19/2010    PCP: Richardean Chimera, MD  REFERRING PROVIDER: Beverely Low, MD   REFERRING DIAG: Unilateral primary osteoarthritis, right knee   THERAPY DIAG:  Acute pain of right knee  Stiffness of right knee, not elsewhere classified  Localized edema  Rationale for Evaluation and Treatment: Rehabilitation  ONSET DATE: 06/10/22  SUBJECTIVE:   SUBJECTIVE STATEMENT: Pt denies  any pain today, but does report mild stiffness.  Pt ready for discharge.   PERTINENT HISTORY: Right, chronic low back pain, chronic cervical pain, anxiety, arthritis, history of cancer, depression, fibromyalgia, and hypertension PAIN:  Are you having pain? No  PRECAUTIONS: Fall  WEIGHT BEARING RESTRICTIONS: No  FALLS:  Has patient fallen in last 6 months? Yes. Number of falls 1; out in her yard  LIVING ENVIRONMENT: Lives with: lives with their spouse Lives in: House/apartment Stairs: Yes: External: 5 steps; can reach both; step to pattern "up with the good and down with the bad"  Has following equipment at home: Walker - 2 wheeled  OCCUPATION: disabled  PLOF: Independent  PATIENT GOALS: improved mobility and return to yard work    NEXT MD VISIT: unsure  OBJECTIVE:   PATIENT SURVEYS:  FOTO 16.35  COGNITION: Overall cognitive status: Within functional limits for tasks assessed     SENSATION: Patient reports no numbness or tingling  EDEMA:  Circumferential: Right knee joint line: 50 cm Left knee joint line: 43 cm   PALPATION: TTP: right quadriceps, IT band, TFL, hamstrings, hip adductors, and proximal gastrocnemius  LOWER EXTREMITY ROM:  Active ROM Right eval Left eval  Hip flexion    Hip extension    Hip abduction    Hip adduction    Hip internal rotation    Hip external rotation    Knee flexion 64/ 66 (PROM)  129  Knee extension 12 0  Ankle dorsiflexion    Ankle plantarflexion    Ankle inversion    Ankle eversion     (Blank rows = not tested)  LOWER EXTREMITY MMT: not tested due to surgical condition  FUNCTIONAL TESTS:  Required moderate assistance with bed mobility, sit to supine, and supine to sit transfers  GAIT: Assistive device utilized: Environmental consultant - 2 wheeled Level of assistance: Modified independence Comments: Step to pattern with decreased gait speed and stride length secondary to right knee pain   TODAY'S TREATMENT:                                                                                                                              DATE:                                                                             07-12-22                                   EXERCISE LOG  Exercise Repetitions and Resistance Comments  Nustep Lvl 3 x 5 mins; seat 7   Recumbent bike Lvl 3  x 15 mins; seat 5   Knee ext 10# 3x10-15   Knee flex 20# 3 x10-15   Heel/Toe Raises Discontinued due to foot pain   Rockerboard Discontinued due to foot pain   Lunges 10" box x 4  mins     Blank cell = exercise not performed today  Modalities: no redness or adverse reaction to today's modalities  Date:  Vaso: Knee, 34 degrees; low pressure, 10 mins, Pain and Edema  PATIENT EDUCATION:  Education  details: HEP, plan of care, prognosis, healing, and goals for therapy Person educated: Patient and Spouse Education method: Explanation Education comprehension: verbalized understanding  HOME EXERCISE PROGRAM: The HEP provided to her in the hospital was reviewed and she was able to properly recall these interventions.  ASSESSMENT:  CLINICAL IMPRESSION: Pt arrives for today's treatment session denying any pain.  Pt instructed in desensitization method for right knee scar that can be performed at home.  Pt has met all of the goals set forth for her at this time.  All questions and concerns encouraged and answered.  Pt encouraged to call the facility if any further questions or concerns arise.  Normal responses to vaso noted.  Pt denied any pain at completion of today's treatment session.  Pt ready for discharge.   PHYSICAL THERAPY DISCHARGE SUMMARY  Visits from Start of Care: 8  Current functional level related to goals / functional outcomes: Patient was able to meet all of her goals for skilled physical therapy.    Remaining deficits: None    Education / Equipment: HEP   Patient agrees to discharge. Patient goals were met. Patient is being discharged due to meeting the stated rehab goals.  Candi Leash, PT, DPT    OBJECTIVE IMPAIRMENTS: Abnormal gait, decreased activity tolerance, decreased mobility, difficulty walking, decreased ROM, decreased strength, hypomobility, increased edema, impaired tone, and pain.   ACTIVITY LIMITATIONS: carrying, lifting, standing, squatting, sleeping, stairs, transfers, bed mobility, bathing, toileting, dressing, and locomotion level  PARTICIPATION LIMITATIONS: meal prep, cleaning, laundry, driving, shopping, community activity, and yard work  PERSONAL FACTORS: 3+ comorbidities: Allergies, chronic low back pain, chronic cervical pain, anxiety, arthritis, history of cancer, depression, fibromyalgia, and hypertension  are also affecting patient's  functional outcome.   REHAB POTENTIAL: Good  CLINICAL DECISION MAKING: Evolving/moderate complexity  EVALUATION COMPLEXITY: Moderate   GOALS: Goals reviewed with patient? Yes  LONG TERM GOALS: Target date: 07/11/22  Patient will be independent with her HEP. Baseline:  Goal status: MET  2.  Patient will be able to demonstrate active right knee extension within 5 degrees of neutral for improved gait mechanics. Baseline: -2 degrees of extension Goal status: MET  3.  Patient will be able to demonstrate at least 120 degrees of right knee flexion for improved function navigating stairs. Baseline: 5/6: 92 degrees Goal status: MET   4.  Patient will be able to safely ambulate at least 80 feet without an assistive device for improved household mobility. Baseline:  Goal status: MET   PLAN:  PT FREQUENCY: 2x/week  PT DURATION: 4 weeks  PLANNED INTERVENTIONS: Therapeutic exercises, Therapeutic activity, Neuromuscular re-education, Balance training, Gait training, Patient/Family education, Self Care, Joint mobilization, Stair training, Electrical stimulation, Cryotherapy, Moist heat, Vasopneumatic device, Manual therapy, and Re-evaluation  PLAN FOR NEXT SESSION: MD note and FOTO   Newman Pies, PTA 07/12/2022, 1:52 PM

## 2022-08-12 ENCOUNTER — Other Ambulatory Visit (HOSPITAL_COMMUNITY): Payer: Self-pay | Admitting: Medical

## 2022-08-12 ENCOUNTER — Ambulatory Visit (HOSPITAL_COMMUNITY)
Admission: RE | Admit: 2022-08-12 | Discharge: 2022-08-12 | Disposition: A | Discharge status: Home / Self Care | Payer: BC Managed Care – PPO | Source: Ambulatory Visit | Attending: Family Medicine | Admitting: Family Medicine

## 2022-08-12 DIAGNOSIS — M7989 Other specified soft tissue disorders: Secondary | ICD-10-CM | POA: Insufficient documentation

## 2022-08-12 DIAGNOSIS — M79604 Pain in right leg: Secondary | ICD-10-CM | POA: Insufficient documentation

## 2022-08-23 ENCOUNTER — Ambulatory Visit: Payer: BC Managed Care – PPO

## 2022-09-01 ENCOUNTER — Ambulatory Visit: Payer: BC Managed Care – PPO | Admitting: Nurse Practitioner

## 2022-09-05 ENCOUNTER — Ambulatory Visit: Payer: BC Managed Care – PPO | Admitting: Cardiovascular Disease

## 2022-09-29 ENCOUNTER — Telehealth: Payer: Self-pay | Admitting: Cardiovascular Disease

## 2022-09-29 NOTE — Telephone Encounter (Signed)
Spoke with patient and explained the rationale for the stress test. Patient states she will check with her insurance on what cost to expect and to let us know. Patient states her knee is doing better and felt like she could physically do this now. Patient will let us know when she is ready to schedule.

## 2022-09-29 NOTE — Telephone Encounter (Signed)
Called patient to schedule GXT Dr Nelly Laurence had ordered and patient stated she is feeling much better and does not feel the need to have the test. Patient asked to cancel the order.

## 2022-11-07 ENCOUNTER — Other Ambulatory Visit: Payer: Self-pay | Admitting: Internal Medicine

## 2022-12-06 ENCOUNTER — Encounter: Payer: Self-pay | Admitting: Neurology

## 2023-01-03 NOTE — Progress Notes (Unsigned)
NEUROLOGY CONSULTATION NOTE  Elizabeth Cohen MRN: [id number] DOB: March 26, 1960  Referring provider: Donzetta Sprung, MD Primary care provider: Donzetta Sprung, MD  Reason for consult:  migraine  Assessment/Plan:   Migraine with aura, without status migrainosus, not intractable  Migraine prevention:  Change from Emgality to Ajovy.  Would not use Aimovig as patient has hypertension that requires 2 antihypertensive medications. Migraine rescue:  She will try samples of Ubrelvy 100mg . Limit use of pain relievers to no more than 2 days out of week to prevent risk of rebound or medication-overuse headache. Keep headache diary Follow up 5 months.    Subjective:  Elizabeth Cohen is a 62 year old right-handed female with HTN, arthritis, OCD, anxiety, depression, fibromyalgia and history of Bell's palsy and breast cancer who presents for migraines.  History supplemented by referring provider's note.  Onset:  62 years old Location:  right supraorbital notch Quality:  sharp/knife-like Intensity:  8-9/10.   Aura:  tunnel vision, lightening (bilaterally) Prodrome:  absent Associated symptoms:  Nausea, photophobia, phonophobia, osmophobia.  She denies associated unilateral numbness or weakness. Duration:  30 minutes with rizatriptan, otherwise several hours Frequency:  2-3 days a week over the last 2-3 months (initially better on Emgality, occurring no more than once a month, but daily prior to [employer]) Triggers:  change in weather, perfumes, can't pinpoint food triggers Relieving factors:  applying pressure over her eyes and rests in dark and quiet room. Activity:  aggravates  Past NSAIDS/analgesics:  Excedrin, ibuprofen, Tylenol Past abortive triptans:  sumatriptan tab Past abortive ergotamine:  none Past muscle relaxants:  none Past anti-emetic:  Phenergan Past antihypertensive medications:  furosemide Past antidepressant medications:  amitriptyline, venlafaxine Past anticonvulsant  medications:  topiramate Past anti-CGRP:  Nurtec Past vitamins/Herbal/Supplements:  magensium Other past therapies:  none  Current NSAIDS/analgesics:  Percocet (fibromyalgia) Current triptans:  rizatriptan 10mg  Current ergotamine:  none Current anti-emetic:  Zofran 4mg  Current muscle relaxants:  tizanidine 4mg  TID PRN Current Antihypertensive medications:  metoprolol succinate ER 50mg  daily, amlodipine Current Antidepressant medications:  duloxetine 60mg  BID, Wellbutrin XL 150mg  daily Current Anticonvulsant medications:  none Current anti-CGRP:  Emgality    Caffeine:  1 cup decaf daily.  No soda or tea Diet:  Water.  No soda.  Does not skip meals. Exercise:  no Depression:  yes; Anxiety:  no Other pain:  fibromyalgia, somewhat controlled.  Recent right knee replacement Sleep hygiene:  poor.  Trouble staying asleep, every 1-2 hours.  Only goes to bathroom once. Family history of headache:  mom (migraines), paternal grandmother (migraines), sister (migraines), daughter (migraines), grandson (migraines)      PAST MEDICAL HISTORY: Past Medical History:  Diagnosis Date   Acid reflux    Anxiety    OCD   Arthritis    Bell's palsy 03/2019   Cancer (HCC)    Skin right arm and left ear, left breast   Complication of anesthesia    slow to wake up during wisdom teeth ext, no other problems with the other surgery   Depression    Fibromyalgia    History of skin cancer    Basal cell, squamous cell   right arm and left ear, left breast    Hypertension    Migraine    Palpitations    PONV (postoperative nausea and vomiting)    Positive ANA (antinuclear antibody)    no problems, no meds    PAST SURGICAL HISTORY: Past Surgical History:  Procedure Laterality Date   ABDOMINAL HYSTERECTOMY  APPENDECTOMY  2002   BACK SURGERY  07/25/2019   Laminectomy and poraminotomy lumbar @ [facility]   CARPAL TUNNEL RELEASE  2015   CHOLECYSTECTOMY  2004   COLONOSCOPY   10/04/2010   Procedure: COLONOSCOPY;  Surgeon: Corbin Ade, MD;  [facility]: [facility];  Service: Endoscopy;  Laterality: N/A;  8:15AM   COLONOSCOPY WITH PROPOFOL N/A 12/26/2019   Procedure: COLONOSCOPY WITH PROPOFOL;  Surgeon: Corbin Ade, MD;  Location: [facility];  Service: Endoscopy;  Laterality: N/A;  12:30pm   ESOPHAGOGASTRODUODENOSCOPY (EGD) WITH PROPOFOL N/A 12/26/2019   Procedure: ESOPHAGOGASTRODUODENOSCOPY (EGD) WITH PROPOFOL;  Surgeon: Corbin Ade, MD;  Location: [facility];  Service: Endoscopy;  Laterality: N/A;   EXTERNAL EAR SURGERY Left 01/2018   Skin cancer   FOOT SURGERY Bilateral 2018   KNEE ARTHROSCOPY W/ DEBRIDEMENT Left    POLYPECTOMY  12/26/2019   Procedure: POLYPECTOMY;  Surgeon: Corbin Ade, MD;  Location: [facility];  Service: Endoscopy;;   SKIN CANCER EXCISION Right 09/20/2018   Right arm   TENDON REPAIR     TOTAL KNEE ARTHROPLASTY Right 06/10/2022   Procedure: TOTAL KNEE ARTHROPLASTY;  Surgeon: Beverely Low, MD;  Location: [facility];  Service: Orthopedics;  Laterality: Right;   ULNAR NERVE REPAIR     UPPER GI ENDOSCOPY     WISDOM TOOTH EXTRACTION      MEDICATIONS: Current Outpatient Medications on File Prior to Visit  Medication Sig Dispense Refill   amLODipine (NORVASC) 5 MG tablet Take 5 mg by mouth in the morning.     buPROPion (WELLBUTRIN XL) 150 MG 24 hr tablet Take 150 mg by mouth in the morning.     DULoxetine (CYMBALTA) 60 MG capsule Take 60 mg by mouth 2 (two) times daily.     EMGALITY 120 MG/ML SOAJ Inject 1 mL into the skin every 30 (thirty) days.     fluticasone (FLONASE) 50 MCG/ACT nasal spray Place 1 spray into both nostrils daily.     furosemide (LASIX) 20 MG tablet Take 1 tablet (20 mg total) by mouth daily as needed (leg swelling). 90 tablet 1   levocetirizine (XYZAL) 5 MG tablet Take 2.5 mg by mouth every evening.     metoprolol succinate (TOPROL-XL) 50 MG 24 hr tablet Take 50 mg by mouth in the morning.      Nutritional Supplements (ESTROVEN PO) Take 1 capsule by mouth in the morning.     ondansetron (ZOFRAN) 4 MG tablet Take 1 tablet (4 mg total) by mouth every 8 (eight) hours as needed for refractory nausea / vomiting, vomiting or nausea. 30 tablet 1   oxyCODONE-acetaminophen (PERCOCET) 10-325 MG tablet Take 1 tablet by mouth every 4 (four) hours as needed for pain. 30 tablet 0   pantoprazole (PROTONIX) 40 MG tablet TAKE 1 TABLET BY MOUTH TWICE DAILY . APPOINTMENT REQUIRED FOR FUTURE REFILLS 120 tablet 1   rizatriptan (MAXALT) 10 MG tablet Take 10 mg by mouth daily as needed for migraine. May repeat in 2 hours if needed     tiZANidine (ZANAFLEX) 4 MG tablet Take 1 tablet (4 mg total) by mouth every 8 (eight) hours as needed for muscle spasms. 60 tablet 1   valACYclovir (VALTREX) 1000 MG tablet Take 1,000 mg by mouth 2 (two) times daily as needed (cold sores/fever blisters.).     No current facility-administered medications on file prior to visit.    ALLERGIES: Allergies  Allergen Reactions   Sulfur Hives  Celecoxib Hives   Codeine Itching and Nausea And Vomiting   Epinephrine Other (See Comments)    dizziness   Gabapentin Other (See Comments)    Altered mental state   Latex     Skin blisters   Morphine Hives   Naproxen Other (See Comments)    Gerd   Povidone-Iodine     Betadine cause skin blisters   Rabeprazole Nausea Only and Other (See Comments)    Nausea, abdominal pain   Tape Other (See Comments)    Surgical/ Blisters Bandage adhesive/ Blisters Band-aids/ Blisters   Topamax [Topiramate]     Altered mental state   Sulfonamide Derivatives Rash    FAMILY HISTORY: Family History  Problem Relation Age of Onset   Stroke Mother    Hypertension Mother    Heart disease Mother    Atrial fibrillation Mother    Hypertension Father     Objective:  Blood pressure 138/84, pulse 67, height 5\' 6"  (1.676 m), weight 207 lb (93.9 kg), SpO2 96%. General: No acute distress.  Patient  appears well-groomed.   Head:  Normocephalic/atraumatic Eyes:  fundi examined but not visualized Neck: supple, no paraspinal tenderness, full range of motion Heart: regular rate and rhythm Neurological Exam: Mental status: alert and oriented to person, place, and time, speech fluent and not dysarthric, language intact. Cranial nerves: CN I: not tested CN II: pupils equal, round and reactive to light, visual fields intact CN III, IV, VI:  full range of motion, no nystagmus, no ptosis CN V: facial sensation intact. CN VII: upper and lower face symmetric CN VIII: hearing intact CN IX, X: gag intact, uvula midline CN XI: sternocleidomastoid and trapezius muscles intact CN XII: tongue midline Bulk & Tone: normal, no fasciculations. Motor:  muscle strength 5/5 throughout Sensation:  Pinprick and vibratory sensation intact. Deep Tendon Reflexes:  2+ throughout,  toes downgoing.   Finger to nose testing:  Without dysmetria.   Gait:  Normal station and stride.  Romberg negative.    Thank you for allowing me to take part in the care of this patient.  Shon Millet, DO  CC: Donzetta Sprung, MD

## 2023-01-04 ENCOUNTER — Encounter: Payer: Self-pay | Admitting: Neurology

## 2023-01-04 ENCOUNTER — Ambulatory Visit: Payer: BC Managed Care – PPO | Admitting: Neurology

## 2023-01-04 VITALS — BP 138/84 | HR 67 | Ht 66.0 in | Wt 207.0 lb

## 2023-01-04 DIAGNOSIS — G43109 Migraine with aura, not intractable, without status migrainosus: Secondary | ICD-10-CM | POA: Diagnosis not present

## 2023-01-04 MED ORDER — AJOVY 225 MG/1.5ML ~~LOC~~ SOAJ: 225.0000 mg | SUBCUTANEOUS | 11 refills | Status: AC

## 2023-01-04 NOTE — Patient Instructions (Signed)
  Start Ajovy every 28 days.   Take Ubrelvy at earliest onset of headache.  May repeat dose once in 2 hours if needed.  Maximum 2 tablets in 24 hours.  Let me know if you want a prescription Limit use of pain relievers to no more than 2 days out of the week.  These medications include acetaminophen, NSAIDs (ibuprofen/Advil/Motrin, naproxen/Aleve, triptans (Imitrex/sumatriptan), Excedrin, and narcotics.  This will help reduce risk of rebound headaches. Be aware of common food triggers Routine exercise Stay adequately hydrated (aim for 64 oz water daily) Keep headache diary Maintain proper stress management Maintain proper sleep hygiene Do not skip meals Consider supplements:  magnesium citrate 400mg  daily, riboflavin 400mg  daily, coenzyme Q10 300mg  daily

## 2023-01-04 NOTE — Progress Notes (Signed)
Medication Samples have been provided to the patient.  Drug name: Bernita Raisin       Strength: 100 mg        Qty: 4  LOT: [id number]  Exp.Date: 02/2024  Dosing instructions: as needed  The patient has been instructed regarding the correct time, dose, and frequency of taking this medication, including desired effects and most common side effects.   Leida Lauth 1:23 PM 01/04/2023

## 2023-01-11 ENCOUNTER — Encounter: Payer: Self-pay | Admitting: Neurology

## 2023-01-19 ENCOUNTER — Telehealth: Payer: Self-pay

## 2023-01-19 NOTE — Telephone Encounter (Signed)
PA needed for Ajovy.

## 2023-01-30 ENCOUNTER — Telehealth: Payer: Self-pay | Admitting: Pharmacy Technician

## 2023-01-30 ENCOUNTER — Other Ambulatory Visit (HOSPITAL_COMMUNITY): Payer: Self-pay

## 2023-01-30 NOTE — Telephone Encounter (Signed)
PA request has been Submitted. New Encounter created for follow up. For additional info see Pharmacy Prior Auth telephone encounter from 01/30/23.

## 2023-01-30 NOTE — Telephone Encounter (Signed)
Pharmacy Patient Advocate Encounter   Received notification from Pt Calls Messages that prior authorization for Ajovy 225MG /1.5ML auto-injectors is required/requested.   Insurance verification completed.   The patient is insured through [facility] .   Per test claim: PA required; PA submitted to above mentioned insurance via CoverMyMeds Key/confirmation #/EOC [id number] Status is pending PA Case ID #: [id number]

## 2023-01-31 ENCOUNTER — Other Ambulatory Visit (HOSPITAL_COMMUNITY): Payer: Self-pay

## 2023-01-31 NOTE — Telephone Encounter (Signed)
Pharmacy Patient Advocate Encounter  Received notification from [facility] that Prior Authorization for AJOVY (fremanezumab-vfrm) injection 225MG /1.5ML auto-injectors has been APPROVED from 01/30/2023 to 04/24/2023. Ran test claim, Copay is $24.99. This test claim was processed through [facility]- copay amounts may vary at other pharmacies due to pharmacy/plan contracts, or as the patient moves through the different stages of their insurance plan.   PA #/Case ID/Reference #: [id number]

## 2023-03-31 ENCOUNTER — Encounter: Payer: Self-pay | Admitting: Neurology

## 2023-04-24 ENCOUNTER — Telehealth: Payer: Self-pay | Admitting: Pharmacy Technician

## 2023-04-24 ENCOUNTER — Other Ambulatory Visit (HOSPITAL_COMMUNITY): Payer: Self-pay

## 2023-04-24 NOTE — Telephone Encounter (Signed)
 PA request has been Submitted. New Encounter has been or will be created for follow up. For additional info see Pharmacy Prior Auth telephone encounter from 04/24/23.

## 2023-04-24 NOTE — Telephone Encounter (Addendum)
 Pharmacy Patient Advocate Encounter   Received notification from Pt Advice Request that prior authorization for Ajovy 225mg  is required/requested.   Insurance verification completed.   The patient is insured through [facility] .   Per test claim: PA required; PA submitted to above mentioned insurance via CoverMyMeds Key/confirmation #/EOC [facility] Status is pending

## 2023-04-25 ENCOUNTER — Other Ambulatory Visit (HOSPITAL_COMMUNITY): Payer: Self-pay

## 2023-04-25 NOTE — Telephone Encounter (Signed)
 Pharmacy Patient Advocate Encounter  Received notification from [facility] that Prior Authorization for AJOVY 225MG  has been APPROVED from 3.10.25 to 3.10.26. Ran test claim, Copay is $24.98. This test claim was processed through [facility]- copay amounts may vary at other pharmacies due to pharmacy/plan contracts, or as the patient moves through the different stages of their insurance plan.   PA #/Case ID/Reference #: [id number]

## 2023-04-27 ENCOUNTER — Ambulatory Visit: Payer: BC Managed Care – PPO | Admitting: Neurology

## 2023-07-03 NOTE — Progress Notes (Deleted)
 NEUROLOGY FOLLOW UP OFFICE NOTE  Elizabeth Cohen [id number]  Assessment/Plan:   Migraine with aura, without status migrainosus, not intractable  Migraine prevention:  Change from Emgality  to Ajovy .  Would not use Aimovig as patient has hypertension that requires 2 antihypertensive medications. Migraine rescue:  She will try samples of Ubrelvy 100mg . Limit use of pain relievers to no more than 2 days out of week to prevent risk of rebound or medication-overuse headache. Keep headache diary Follow up 5 months.    Subjective:  Elizabeth Cohen is a 63 year old right-handed female with HTN, arthritis, OCD, anxiety, depression, fibromyalgia and history of Bell's palsy and breast cancer who follows up for migraines.  UPDATE: Changed from Emgality  to Ajovy . *** Tried samples of Ubrelvy *** Intensity:  *** Duration:  *** Frequency:  *** Frequency of abortive medication: *** Current NSAIDS/analgesics:  Percocet (fibromyalgia) Current triptans:  rizatriptan 10mg  Current ergotamine:  none Current anti-emetic:  Zofran  4mg  Current muscle relaxants:  tizanidine  4mg  TID PRN Current Antihypertensive medications:  metoprolol  succinate ER 50mg  daily, amlodipine  Current Antidepressant medications:  duloxetine  60mg  BID, Wellbutrin  XL 150mg  daily Current Anticonvulsant medications:  none Current anti-CGRP:  Ajovy , Ubrelvy 100mg  (samples) ***    Caffeine:  1 cup decaf daily.  No soda or tea Diet:  Water .  No soda.  Does not skip meals. Exercise:  no Depression:  yes; Anxiety:  no Other pain:  fibromyalgia, somewhat controlled.  Recent right knee replacement Sleep hygiene:  poor.  Trouble staying asleep, every 1-2 hours.  Only goes to bathroom once.  HISTORY: Onset:  63 years old Location:  right supraorbital notch Quality:  sharp/knife-like Intensity:  8-9/10.   Aura:  tunnel vision, lightening (bilaterally) Prodrome:  absent Associated symptoms:  Nausea, photophobia, phonophobia,  osmophobia.  She denies associated unilateral numbness or weakness. Duration:  30 minutes with rizatriptan, otherwise several hours Frequency:  2-3 days a week over the last 2-3 months (initially better on Emgality , occurring no more than once a month, but daily prior to Emgality ) Triggers:  change in weather, perfumes, can't pinpoint food triggers Relieving factors:  applying pressure over her eyes and rests in dark and quiet room. Activity:  aggravates  Past NSAIDS/analgesics:  Excedrin, ibuprofen, Tylenol  Past abortive triptans:  sumatriptan  tab Past abortive ergotamine:  none Past muscle relaxants:  none Past anti-emetic:  Phenergan Past antihypertensive medications:  furosemide  Past antidepressant medications:  amitriptyline, venlafaxine Past anticonvulsant medications:  topiramate Past anti-CGRP:  Nurtec, Emgality  Past vitamins/Herbal/Supplements:  magensium Other past therapies:  none   Family history of headache:  mom (migraines), paternal grandmother (migraines), sister (migraines), daughter (migraines), grandson (migraines)  PAST MEDICAL HISTORY: Past Medical History:  Diagnosis Date   Acid reflux    Anxiety    OCD   Arthritis    Bell's palsy 03/2019   Cancer (HCC)    Skin right arm and left ear, left breast   Complication of anesthesia    slow to wake up during wisdom teeth ext, no other problems with the other surgery   Depression    Fibromyalgia    History of skin cancer    Basal cell, squamous cell   right arm and left ear, left breast    Hypertension    Migraine    Palpitations    PONV (postoperative nausea and vomiting)    Positive ANA (antinuclear antibody)    no problems, no meds    MEDICATIONS: Current Outpatient Medications on File Prior to Visit  Medication Sig Dispense Refill   amLODipine  (NORVASC ) 5 MG tablet Take 5 mg by mouth in the morning.     buPROPion  (WELLBUTRIN  XL) 150 MG 24 hr tablet Take 150 mg by mouth in the morning.     DULoxetine   (CYMBALTA ) 60 MG capsule Take 60 mg by mouth 2 (two) times daily.     Fremanezumab -vfrm (AJOVY ) 225 MG/1.5ML SOAJ Inject 225 mg into the skin every 28 (twenty-eight) days. 1.68 mL 11   metoprolol  succinate (TOPROL -XL) 50 MG 24 hr tablet Take 50 mg by mouth in the morning.     Nutritional Supplements (ESTROVEN PO) Take 1 capsule by mouth in the morning.     oxyCODONE -acetaminophen  (PERCOCET) 10-325 MG tablet Take 1 tablet by mouth every 4 (four) hours as needed for pain. 30 tablet 0   pantoprazole  (PROTONIX ) 40 MG tablet TAKE 1 TABLET BY MOUTH TWICE DAILY . APPOINTMENT REQUIRED FOR FUTURE REFILLS 120 tablet 1   rizatriptan (MAXALT) 10 MG tablet Take 10 mg by mouth daily as needed for migraine. May repeat in 2 hours if needed     tiZANidine  (ZANAFLEX ) 4 MG tablet Take 1 tablet (4 mg total) by mouth every 8 (eight) hours as needed for muscle spasms. 60 tablet 1   valACYclovir  (VALTREX ) 1000 MG tablet Take 1,000 mg by mouth 2 (two) times daily as needed (cold sores/fever blisters.).     No current facility-administered medications on file prior to visit.    ALLERGIES: Allergies  Allergen Reactions   Sulfur Hives   Celecoxib Hives   Codeine Itching and Nausea And Vomiting   Epinephrine  Other (See Comments)    dizziness   Gabapentin Other (See Comments)    Altered mental state   Latex     Skin blisters   Morphine Hives   Naproxen Other (See Comments)    Gerd   Povidone-Iodine      Betadine  cause skin blisters   Rabeprazole  Nausea Only and Other (See Comments)    Nausea, abdominal pain   Tape Other (See Comments)    Surgical/ Blisters Bandage adhesive/ Blisters Band-aids/ Blisters   Topamax [Topiramate]     Altered mental state   Sulfonamide Derivatives Rash    FAMILY HISTORY: Family History  Problem Relation Age of Onset   Migraines Mother    Stroke Mother    Hypertension Mother    Heart disease Mother    Atrial fibrillation Mother    Hypertension Father    Migraines Sister     Alzheimer's disease Paternal Grandmother    Migraines Daughter       Objective:  *** General: No acute distress.  Patient appears well-groomed.   ***   Janne Members, DO  CC: Patra Bonnet, MD

## 2023-07-04 ENCOUNTER — Ambulatory Visit: Payer: BC Managed Care – PPO | Admitting: Neurology

## 2023-07-21 ENCOUNTER — Other Ambulatory Visit: Payer: Self-pay | Admitting: Internal Medicine

## 2023-09-26 ENCOUNTER — Other Ambulatory Visit (HOSPITAL_BASED_OUTPATIENT_CLINIC_OR_DEPARTMENT_OTHER): Payer: Self-pay

## 2023-09-26 MED ORDER — CEPHALEXIN 500 MG PO CAPS
500.0000 mg | ORAL_CAPSULE | Freq: Three times a day (TID) | ORAL | 0 refills | Status: AC
  Filled 2023-09-26: qty 21, 7d supply, fill #0

## 2023-12-18 ENCOUNTER — Other Ambulatory Visit (HOSPITAL_BASED_OUTPATIENT_CLINIC_OR_DEPARTMENT_OTHER): Payer: Self-pay

## 2023-12-18 DIAGNOSIS — Z6833 Body mass index (BMI) 33.0-33.9, adult: Secondary | ICD-10-CM | POA: Diagnosis not present

## 2023-12-18 DIAGNOSIS — J0100 Acute maxillary sinusitis, unspecified: Secondary | ICD-10-CM | POA: Diagnosis not present

## 2023-12-18 DIAGNOSIS — Z20828 Contact with and (suspected) exposure to other viral communicable diseases: Secondary | ICD-10-CM | POA: Diagnosis not present

## 2023-12-18 DIAGNOSIS — Z2089 Contact with and (suspected) exposure to other communicable diseases: Secondary | ICD-10-CM | POA: Diagnosis not present

## 2023-12-18 DIAGNOSIS — R051 Acute cough: Secondary | ICD-10-CM | POA: Diagnosis not present

## 2023-12-18 MED ORDER — AMOXICILLIN 500 MG PO CAPS
1500.0000 mg | ORAL_CAPSULE | Freq: Two times a day (BID) | ORAL | 0 refills | Status: AC
  Filled 2023-12-18: qty 30, 5d supply, fill #0

## 2023-12-19 DIAGNOSIS — F1120 Opioid dependence, uncomplicated: Secondary | ICD-10-CM | POA: Diagnosis not present

## 2023-12-19 DIAGNOSIS — M797 Fibromyalgia: Secondary | ICD-10-CM | POA: Diagnosis not present

## 2023-12-19 DIAGNOSIS — M542 Cervicalgia: Secondary | ICD-10-CM | POA: Diagnosis not present

## 2023-12-19 DIAGNOSIS — M48062 Spinal stenosis, lumbar region with neurogenic claudication: Secondary | ICD-10-CM | POA: Diagnosis not present
